# Patient Record
Sex: Male | Born: 1950 | Race: White | Hispanic: No | Marital: Married | Smoking: Former smoker
Health system: Southern US, Community
[De-identification: ages and names within clinical notes are randomized; demographics above are authoritative.]

## PROBLEM LIST (undated history)

## (undated) DIAGNOSIS — R011 Cardiac murmur, unspecified: Secondary | ICD-10-CM

## (undated) DIAGNOSIS — I10 Essential (primary) hypertension: Secondary | ICD-10-CM

## (undated) DIAGNOSIS — Z8679 Personal history of other diseases of the circulatory system: Secondary | ICD-10-CM

## (undated) DIAGNOSIS — L0390 Cellulitis, unspecified: Secondary | ICD-10-CM

## (undated) DIAGNOSIS — I219 Acute myocardial infarction, unspecified: Secondary | ICD-10-CM

## (undated) DIAGNOSIS — Z953 Presence of xenogenic heart valve: Secondary | ICD-10-CM

## (undated) HISTORY — PX: CARDIAC CATHETERIZATION: SHX172

## (undated) HISTORY — DX: Cardiac murmur, unspecified: R01.1

## (undated) HISTORY — PX: TONSILLECTOMY: SUR1361

## (undated) HISTORY — DX: Acute myocardial infarction, unspecified: I21.9

## (undated) HISTORY — DX: Essential (primary) hypertension: I10

## (undated) HISTORY — PX: VASECTOMY: SHX75

## (undated) HISTORY — PX: CARDIAC VALVE REPLACEMENT: SHX585

---
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
## 1997-09-06 ENCOUNTER — Ambulatory Visit (HOSPITAL_COMMUNITY): Admission: RE | Admit: 1997-09-06 | Discharge: 1997-09-06 | Payer: Self-pay | Admitting: Gastroenterology

## 1997-12-23 ENCOUNTER — Ambulatory Visit (HOSPITAL_BASED_OUTPATIENT_CLINIC_OR_DEPARTMENT_OTHER): Admission: RE | Admit: 1997-12-23 | Discharge: 1997-12-23 | Payer: Self-pay | Admitting: Orthopedic Surgery

## 2000-07-26 ENCOUNTER — Encounter: Admission: RE | Admit: 2000-07-26 | Discharge: 2000-10-24 | Payer: Self-pay | Admitting: Family Medicine

## 2001-06-08 ENCOUNTER — Encounter: Payer: Self-pay | Admitting: General Surgery

## 2001-06-13 ENCOUNTER — Ambulatory Visit (HOSPITAL_COMMUNITY): Admission: RE | Admit: 2001-06-13 | Discharge: 2001-06-14 | Payer: Self-pay | Admitting: General Surgery

## 2001-06-13 ENCOUNTER — Encounter (INDEPENDENT_AMBULATORY_CARE_PROVIDER_SITE_OTHER): Payer: Self-pay | Admitting: Specialist

## 2001-10-21 ENCOUNTER — Encounter: Payer: Self-pay | Admitting: Emergency Medicine

## 2001-10-22 ENCOUNTER — Observation Stay (HOSPITAL_COMMUNITY): Admission: EM | Admit: 2001-10-22 | Discharge: 2001-10-22 | Payer: Self-pay | Admitting: Emergency Medicine

## 2004-07-02 ENCOUNTER — Ambulatory Visit: Payer: Self-pay | Admitting: Internal Medicine

## 2004-08-03 ENCOUNTER — Ambulatory Visit: Payer: Self-pay | Admitting: Internal Medicine

## 2005-05-31 HISTORY — PX: CORONARY ARTERY BYPASS GRAFT: SHX141

## 2005-06-02 ENCOUNTER — Ambulatory Visit: Payer: Self-pay | Admitting: Internal Medicine

## 2005-06-16 ENCOUNTER — Ambulatory Visit (HOSPITAL_COMMUNITY): Admission: RE | Admit: 2005-06-16 | Discharge: 2005-06-16 | Payer: Self-pay | Admitting: Internal Medicine

## 2005-06-16 ENCOUNTER — Ambulatory Visit: Payer: Self-pay | Admitting: Internal Medicine

## 2005-06-30 ENCOUNTER — Ambulatory Visit: Payer: Self-pay | Admitting: Internal Medicine

## 2005-07-06 ENCOUNTER — Encounter: Payer: Self-pay | Admitting: Internal Medicine

## 2005-07-06 ENCOUNTER — Ambulatory Visit: Payer: Self-pay

## 2005-07-15 ENCOUNTER — Ambulatory Visit: Payer: Self-pay | Admitting: Internal Medicine

## 2005-07-15 ENCOUNTER — Ambulatory Visit (HOSPITAL_COMMUNITY): Admission: RE | Admit: 2005-07-15 | Discharge: 2005-07-15 | Payer: Self-pay | Admitting: Internal Medicine

## 2005-07-15 ENCOUNTER — Encounter: Payer: Self-pay | Admitting: Internal Medicine

## 2005-07-19 ENCOUNTER — Ambulatory Visit: Payer: Self-pay | Admitting: Professional

## 2005-08-05 ENCOUNTER — Ambulatory Visit: Payer: Self-pay | Admitting: Professional

## 2006-03-03 ENCOUNTER — Ambulatory Visit: Payer: Self-pay | Admitting: Professional

## 2006-03-28 ENCOUNTER — Ambulatory Visit: Payer: Self-pay | Admitting: Professional

## 2006-04-28 ENCOUNTER — Ambulatory Visit: Payer: Self-pay | Admitting: Internal Medicine

## 2006-12-27 ENCOUNTER — Ambulatory Visit: Payer: Self-pay | Admitting: Internal Medicine

## 2007-01-04 ENCOUNTER — Ambulatory Visit: Payer: Self-pay

## 2007-01-21 ENCOUNTER — Ambulatory Visit: Payer: Self-pay | Admitting: Cardiovascular Disease

## 2007-01-21 ENCOUNTER — Inpatient Hospital Stay (HOSPITAL_COMMUNITY): Admission: EM | Admit: 2007-01-21 | Discharge: 2007-01-25 | Payer: Self-pay | Admitting: Cardiovascular Disease

## 2007-01-23 ENCOUNTER — Encounter: Payer: Self-pay | Admitting: Cardiology

## 2007-01-26 ENCOUNTER — Ambulatory Visit: Payer: Self-pay | Admitting: Cardiology

## 2007-02-03 ENCOUNTER — Ambulatory Visit: Payer: Self-pay | Admitting: Internal Medicine

## 2007-02-06 ENCOUNTER — Ambulatory Visit: Payer: Self-pay | Admitting: Thoracic Surgery (Cardiothoracic Vascular Surgery)

## 2007-02-10 ENCOUNTER — Ambulatory Visit: Payer: Self-pay | Admitting: Cardiology

## 2007-02-17 ENCOUNTER — Ambulatory Visit: Payer: Self-pay | Admitting: Cardiology

## 2007-02-24 ENCOUNTER — Ambulatory Visit (HOSPITAL_COMMUNITY): Admission: RE | Admit: 2007-02-24 | Discharge: 2007-02-24 | Payer: Self-pay | Admitting: Surgery

## 2007-02-27 ENCOUNTER — Encounter
Admission: RE | Admit: 2007-02-27 | Discharge: 2007-02-27 | Payer: Self-pay | Admitting: Thoracic Surgery (Cardiothoracic Vascular Surgery)

## 2007-02-27 ENCOUNTER — Ambulatory Visit: Payer: Self-pay | Admitting: Thoracic Surgery (Cardiothoracic Vascular Surgery)

## 2007-03-03 ENCOUNTER — Ambulatory Visit: Payer: Self-pay | Admitting: Internal Medicine

## 2007-03-24 ENCOUNTER — Ambulatory Visit: Payer: Self-pay | Admitting: Cardiovascular Disease

## 2007-03-29 ENCOUNTER — Ambulatory Visit: Payer: Self-pay | Admitting: Internal Medicine

## 2007-04-03 ENCOUNTER — Ambulatory Visit: Payer: Self-pay | Admitting: Thoracic Surgery (Cardiothoracic Vascular Surgery)

## 2007-04-03 ENCOUNTER — Ambulatory Visit (HOSPITAL_COMMUNITY)
Admission: RE | Admit: 2007-04-03 | Discharge: 2007-04-03 | Payer: Self-pay | Admitting: Thoracic Surgery (Cardiothoracic Vascular Surgery)

## 2007-04-03 ENCOUNTER — Ambulatory Visit
Admission: RE | Admit: 2007-04-03 | Discharge: 2007-04-03 | Payer: Self-pay | Admitting: Thoracic Surgery (Cardiothoracic Vascular Surgery)

## 2007-04-03 ENCOUNTER — Encounter: Payer: Self-pay | Admitting: Thoracic Surgery (Cardiothoracic Vascular Surgery)

## 2007-04-06 ENCOUNTER — Encounter: Payer: Self-pay | Admitting: Internal Medicine

## 2007-04-06 DIAGNOSIS — T7840XA Allergy, unspecified, initial encounter: Secondary | ICD-10-CM | POA: Insufficient documentation

## 2007-04-06 DIAGNOSIS — E782 Mixed hyperlipidemia: Secondary | ICD-10-CM | POA: Insufficient documentation

## 2007-04-06 DIAGNOSIS — M5126 Other intervertebral disc displacement, lumbar region: Secondary | ICD-10-CM | POA: Insufficient documentation

## 2007-04-06 DIAGNOSIS — G4730 Sleep apnea, unspecified: Secondary | ICD-10-CM | POA: Insufficient documentation

## 2007-04-06 DIAGNOSIS — Z952 Presence of prosthetic heart valve: Secondary | ICD-10-CM | POA: Insufficient documentation

## 2007-04-06 DIAGNOSIS — I10 Essential (primary) hypertension: Secondary | ICD-10-CM | POA: Insufficient documentation

## 2007-04-10 ENCOUNTER — Ambulatory Visit: Payer: Self-pay | Admitting: Thoracic Surgery (Cardiothoracic Vascular Surgery)

## 2007-04-19 ENCOUNTER — Ambulatory Visit: Payer: Self-pay | Admitting: Thoracic Surgery (Cardiothoracic Vascular Surgery)

## 2007-04-19 ENCOUNTER — Inpatient Hospital Stay (HOSPITAL_COMMUNITY)
Admission: RE | Admit: 2007-04-19 | Discharge: 2007-04-24 | Payer: Self-pay | Admitting: Thoracic Surgery (Cardiothoracic Vascular Surgery)

## 2007-04-19 ENCOUNTER — Encounter: Payer: Self-pay | Admitting: Thoracic Surgery (Cardiothoracic Vascular Surgery)

## 2007-04-19 DIAGNOSIS — Z953 Presence of xenogenic heart valve: Secondary | ICD-10-CM

## 2007-04-19 DIAGNOSIS — Z8679 Personal history of other diseases of the circulatory system: Secondary | ICD-10-CM

## 2007-04-19 HISTORY — DX: Presence of xenogenic heart valve: Z95.3

## 2007-04-19 HISTORY — DX: Personal history of other diseases of the circulatory system: Z86.79

## 2007-04-26 ENCOUNTER — Ambulatory Visit: Payer: Self-pay | Admitting: Cardiology

## 2007-05-05 ENCOUNTER — Ambulatory Visit: Payer: Self-pay | Admitting: Cardiology

## 2007-05-05 LAB — CONVERTED CEMR LAB: Prothrombin Time: 28.9 s — ABNORMAL HIGH (ref 10.9–13.3)

## 2007-05-11 ENCOUNTER — Encounter (HOSPITAL_COMMUNITY): Admission: RE | Admit: 2007-05-11 | Discharge: 2007-05-31 | Payer: Self-pay | Admitting: Internal Medicine

## 2007-05-12 ENCOUNTER — Ambulatory Visit: Payer: Self-pay | Admitting: Cardiology

## 2007-05-12 ENCOUNTER — Ambulatory Visit: Payer: Self-pay | Admitting: Internal Medicine

## 2007-05-15 ENCOUNTER — Encounter
Admission: RE | Admit: 2007-05-15 | Discharge: 2007-05-15 | Payer: Self-pay | Admitting: Thoracic Surgery (Cardiothoracic Vascular Surgery)

## 2007-05-15 ENCOUNTER — Ambulatory Visit: Payer: Self-pay | Admitting: Thoracic Surgery (Cardiothoracic Vascular Surgery)

## 2007-05-19 ENCOUNTER — Ambulatory Visit: Payer: Self-pay | Admitting: Cardiology

## 2007-06-01 ENCOUNTER — Encounter (HOSPITAL_COMMUNITY): Admission: RE | Admit: 2007-06-01 | Discharge: 2007-08-30 | Payer: Self-pay | Admitting: Internal Medicine

## 2007-06-05 ENCOUNTER — Ambulatory Visit: Payer: Self-pay | Admitting: Internal Medicine

## 2007-07-12 ENCOUNTER — Ambulatory Visit: Payer: Self-pay | Admitting: Internal Medicine

## 2007-08-28 ENCOUNTER — Ambulatory Visit: Payer: Self-pay | Admitting: Thoracic Surgery (Cardiothoracic Vascular Surgery)

## 2007-11-27 ENCOUNTER — Ambulatory Visit: Payer: Self-pay | Admitting: Thoracic Surgery (Cardiothoracic Vascular Surgery)

## 2008-03-12 ENCOUNTER — Ambulatory Visit: Payer: Self-pay | Admitting: Internal Medicine

## 2008-06-03 ENCOUNTER — Ambulatory Visit: Payer: Self-pay | Admitting: Thoracic Surgery (Cardiothoracic Vascular Surgery)

## 2008-10-01 ENCOUNTER — Ambulatory Visit: Payer: Self-pay

## 2008-10-01 ENCOUNTER — Ambulatory Visit: Payer: Self-pay | Admitting: Internal Medicine

## 2008-10-01 ENCOUNTER — Encounter: Payer: Self-pay | Admitting: Internal Medicine

## 2009-06-02 ENCOUNTER — Encounter: Payer: Self-pay | Admitting: Internal Medicine

## 2009-06-02 ENCOUNTER — Ambulatory Visit: Payer: Self-pay | Admitting: Thoracic Surgery (Cardiothoracic Vascular Surgery)

## 2009-12-09 ENCOUNTER — Ambulatory Visit: Payer: Self-pay

## 2009-12-09 ENCOUNTER — Ambulatory Visit (HOSPITAL_COMMUNITY): Admission: RE | Admit: 2009-12-09 | Discharge: 2009-12-09 | Payer: Self-pay | Admitting: Internal Medicine

## 2009-12-09 ENCOUNTER — Ambulatory Visit: Payer: Self-pay | Admitting: Cardiology

## 2009-12-31 ENCOUNTER — Ambulatory Visit: Payer: Self-pay | Admitting: Internal Medicine

## 2010-06-18 ENCOUNTER — Other Ambulatory Visit: Payer: Self-pay | Admitting: Dermatology

## 2010-06-30 NOTE — Letter (Signed)
Summary: [facility]   [facility]   Imported By: Roderic Ovens 06/13/2009 14:04:30  _____________________________________________________________________  External Attachment:    Type:   Image     Comment:   External Document

## 2010-06-30 NOTE — Assessment & Plan Note (Signed)
Summary: [facility]      Allergies Added: NKDA  Visit Type:  Follow-up Primary Provider:  Ellamae Sia MD  CC:  no complaints.  History of Present Illness: [name] [name] is a delightful 60 year old male with a history of severe aortic stenosis, status post aortic valve replacement with bioprosthetic valve and aortic root replacement in 11/08.  He also has a history of atrial fibrillation, status post Cox-Maze procedure, obesity, hypertension, hyperlipidemia, and obstructive sleep apnea.   Here for routine f/u. Feels good. Walking dogs without CP, dyspnea. No palpitations, edema, orthopnea. Recent echo shows stable AVR with normal LV function.     Current Medications (verified): 1)  Toprol Xl 50 Mg  Tb24 (Metoprolol Succinate) .... Once Daily 2)  Caduet 5-20 Mg  Tabs (Amlodipine-Atorvastatin) .... Every Pm 3)  Fish Oil 1000 Mg  Caps (Omega-3 Fatty Acids) .... Take One Tablet By Mouth Once Daily. 4)  Prozac 20 Mg Caps (Fluoxetine Hcl) .... Take One Tablet By Mouth Once Daily.  Allergies (verified): No Known Drug Allergies  Past History:  Past Medical History: Last updated: 10/01/2008 1. Severe AS due to bicuspid AV      --s/p bioprosthetic AVR with root replacement and Cox-Maze, Nov 2008 2. Atrial fib s/p Cox-Maze 3. Obesity 4. HTN 5. Hyperlipidemia 6. Obstructive sleep apnea  Review of Systems       As per HPI and past medical history; otherwise all systems negative.   Vital Signs:  Patient profile:   60 year old male Height:      71 inches Weight:      298 pounds BMI:     41.71 Pulse rate:   58 / minute BP sitting:   146 / 70  (right arm) Cuff size:   regular  Vitals Entered By: Hardin Negus, RMA (December 31, 2009 12:06 PM)  Physical Exam  General:  Gen: well appearing. no resp difficulty HEENT: normal Neck: supple. no JVD. Carotids 2+ bilat; not bruits. No lymphadenopathy or thryomegaly appreciated. Cor: PMI nondisplaced. Regular rate & rhythm. No rubs, gallops.  soft systolic murmur RSB. S2 crisp. Lungs: clear Abdomen: soft, nontender, nondistended. No hepatosplenomegaly. No bruits or masses. Good bowel sounds. Extremities: no cyanosis, clubbing, rash, edema Neuro: alert & orientedx3, cranial nerves grossly intact. moves all 4 extremities w/o difficulty. affect pleasant    Impression & Recommendations:  Problem # 1:  AORTIC STENOSIS (ICD-424.1) Doing well s/p AVR. AVR stable by recent echo.   Problem # 2:  ATRIAL FIBRILLATION Quiescent s/p Maze. Off coumadin.   Other Orders: EKG w/ Interpretation (93000)  Patient Instructions: 1)  Your physician wants you to follow-up in: 1 year.  You will receive a reminder letter in the mail two months in advance. If you don't receive a letter, please call our office to schedule the follow-up appointment.

## 2010-10-13 NOTE — Assessment & Plan Note (Signed)
[facility]                            CARDIOLOGY OFFICE NOTE   DAJOHN, ELLENDER                        MRN:          [id number]  DATE:03/12/2008                            DOB:          June 23, 1950    PRIMARY CARE PHYSICIAN:  Harrel Lemon. Merla Riches, MD   HISTORY:  [name] [name] is a delightful 60 year old male with a history of severe  aortic stenosis, status post aortic valve replacement with bioprosthetic  valve and aortic root replacement.  He also has a history of atrial  fibrillation, status post Cox-Maze procedure, obesity, hypertension,  hyperlipidemia, and obstructive sleep apnea.   He returns today for routine followup.  He is doing quite well.  He  denies any chest pain or shortness of breath.  He has not had any  palpitations.  He is walking 30 minutes a day at lunch and 45 minutes at  night with his dogs.   CURRENT MEDICATIONS:  1. Toprol-XL 50 mg a day.  2. Caduet 5/20.  3. Fish oil.  4. Previously, he was on Cozaar, but this was stopped.   PHYSICAL EXAMINATION:  GENERAL:  He is well-appearing, in no acute  distress.  He walks around the clinic without any respiratory  difficulty.  VITAL SIGNS:  Blood pressure is initially 134/88, on manual recheck  155/90, heart rate 77, weight is 285.  HEENT:  Normal.  NECK:  Supple.  There is no JVD.  Carotids are 2+ bilaterally without  any bruits.  There is no lymphadenopathy or thyromegaly.  CARDIAC:  PMI is nonpalpable.  He has regular rate and rhythm with 2/6  systolic ejection murmur at the right sternal border.  There is no  diastolic component.  There is no rub or gallop.  LUNGS:  Clear.  ABDOMEN:  Obese, nontender, and nondistended.  No obvious  hepatosplenomegaly.  No bruits.  No masses.  Good bowel sounds.  EXTREMITIES:  Warm with no cyanosis, clubbing, or edema.  No rash.  NEUROLOGIC:  Alert and oriented x3.  Cranial nerves II through XII are  intact.  Moves all fours without difficulty.  PSYCH:  Affect is pleasant.   ASSESSMENT/PLAN:  1. Aortic stenosis, status post bioprosthetic valve replacement.  He      is doing well.  We will get a followup echocardiogram next year.  2. Atrial fibrillation.  This is resolved, status post Cox-Maze      procedure.  3. Hypertension.  Blood pressure is elevated.  He did have significant      hypotension after his surgery and we stopped his Cozaar.  We may      need to restart.  He will keep a blood pressure log and e-mail me      in 2 weeks.   DISPOSITION:  I will see him back at 6 months for routine followup.     Bevelyn Buckles. Bensimhon, MD  Electronically Signed    DRB/MedQ  DD: 03/12/2008  DT: 03/13/2008  Job #: [id number]   cc:   Harrel Lemon. Merla Riches, M.D.

## 2010-10-13 NOTE — H&P (Signed)
NAME:  [name] [name], [name] [name] NO.:  [id number]   MEDICAL RECORD NO.:  [id number]          PATIENT TYPE:  INP   LOCATION:  2040                         FACILITY:  [facility]   PHYSICIAN:  Noralyn Pick. Eden Emms, MD, FACCDATE OF BIRTH:  11-Mar-1951   DATE OF ADMISSION:  01/21/2007  DATE OF DISCHARGE:                              HISTORY & PHYSICAL   SUMMARY OF HISTORY:  Mr. [name] [name] is a 60 year old white male who was  transferred from Urgent Care secondary to atrial fibrillation.  The  patient states that he returned Tuesday evening from [place].  He  and his wife drove up to [place] and spent 4-5 days.  On Wednesday morning he  woke up around 10:00 a.m.  When he tried to stand, he became diaphoretic  and dizzy.  When he laid back down, his symptoms were relieved.  He  found that over the next several days, every time he stood up for  changed position, he would have similar symptoms.  He did have nausea on  one night, but he attributed this fragrances.  He denied any associated  chest discomfort, palpitations, shortness of breath or syncope.  He does  recall a prior occurrence similar to this 4-5 years ago and saw Dr.  Tresa Endo, however, specific diagnosis was not made.  He also had a similar  episode in a urology office at least 2 years ago.  Due to the  persistence he went to Urgent Care today and was transferred after EKG  showed atrial fibrillation with rapid ventricular rate and his blood  pressure in the 90s.  His wife also states that he had called the office  earlier this week and Heather asked him to increase his Toprol but he  did not do this.   ALLERGIES:  Allergy to BEE STINGS.   MEDICATIONS PRIOR TO ADMISSION:  1. Toprol XL 50 mg daily.  2. Caduet 5/20 daily.  3. Cozaar 100 daily.  4. Multivitamin which consists of multiple herb combinations.  5. Fish oil unknown dosage.  6. Occasional B12.  7. Prozac 10 mg daily was recently initiated.   PAST MEDICAL HISTORY:  1.  Hypertension with a history of renal Dopplers of unspecified date      as that the report is not specifically available.  According to old      office notes, he has a 30% right renal artery stenosis.  2. He has a history of hyperlipidemia last checked by Dr. Merla Riches      November 30, 2006, showed a total cholesterol 173, triglycerides 123,      HDL 43, LDL 105.  3. Obesity.  4. Obstructive sleep apnea with noncompliance with his CPAP.  5. Moderate aortic stenosis.  Last echocardiogram performed at Dr.      Prescott Gum office on January 04, 2007, showed EF of 75%, moderate      LVH.  There with mid cavity obstruction during Valsalva maneuver      with a peak flow of 5.5 meters per second and a peak gradient of      121 mmHg.  He has a known bicuspid valve by TEE in 2007, gradient      during the echocardiogram on January 04, 2007, showed 37 ABA by VTI      was 1.16.  His mean gradient increased from 24-37.  These results      have not been discussed with the patient nor his wife.  Last      Cardiolite available to [place] was possibly in 2006, and the Persantine      was negative.   PAST SURGICAL HISTORY:  Notable for  1. Hemorrhoids.  2. T&A in 1954.  3. Left knee surgery.  4. He denies specific thyroid disorder, diabetes, myocardial      infarction, CVA or bleeding dyscrasias.   SOCIAL HISTORY:  He resides in [place], [place], with his wife  of 36 years.  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  He has  two children.  No grandchildren.  He quit smoking in 2002, prior to that  he smoked approximately a pack per day for 20 years.  He may have a beer  or shot one time per month.  Denies any drugs.  He does not specifically  exercise.  He tries to maintain a 1300 calories per day diet.   FAMILY HISTORY:  Mother is 69 with thyroid problems.  She also has a  history of EtOH.  Father is 22, alive and well.  He has a brother who is  a cardiologist and a sister, both are alive  and well.   REVIEW OF SYSTEMS:  In addition to above, is notable for a 30-pound  weight loss in the last year, glasses, occasional headache.  Positive  snoring, occasional coughing with rare clear production, depression,  anxiety and mood swings with significant stress at work over the  preceding 9 months, arthralgias in his right knee, bright red blood per  rectum felt secondary to hemorrhoids, polyuria.   PHYSICAL EXAM:  GENERAL:  Well-nourished, well-developed, pleasant white  male in no apparent distress.  VITAL SIGNS:  Blood pressure is 94/62, pulse is 112 and irregular,  respirations are 16.  HEENT:  Unremarkable except for glasses.  NECK:  Supple without thyromegaly, adenopathy, JVD or carotid bruits.  CHEST:  Symmetrical excursion.  Clear to auscultation.  HEART:  Irregular irregular rapid rhythm with a mid peaking systolic  murmur best appreciated at the right sternal border.  Do  not appreciate  any rubs, clicks or gallops.  All pulses are symmetrical and intact  without any abdominal or femoral bruits.  SKIN:  Integument intact.  ABDOMEN:  Obese.  Bowel sounds present without organomegaly, masses or  tenderness.  EXTREMITIES:  Negative cyanosis, clubbing or edema.  MUSCULOSKELETAL:  Grossly unremarkable.  NEUROLOGIC:  Unremarkable.   An EKG from urgent care shows atrial fibrillation with a ventricular  rate of 112, normal axis, delayed R-wave.  Old EKGs are not available  for comparison.   IMPRESSION:  1. Atrial fibrillation with a rapid ventricular rate.  2. Near syncope associated with above.  3. Known moderate aortic stenosis with a bicuspid aortic valve which      has progressed by recent echocardiograms.  4. History as noted above.   DISPOSITION:  Dr. Eden Emms reviewed the patient's history, spoke with and  examined the patient.  He discussed findings of echocardiogram and  informed the patient and his wife that he may need aortic valve surgery  within the next  couple of years due to  the progression.  We will  increase his beta blocker to 50 mg b.i.d. and hydrate.  We will also  start Coumadin and heparin and encourage atrial fibrillation and  Coumadin education.  A TEE cardioversion  has potentially been set up for Monday at 9:00 a.m. with Dr. Jens Som  unless the patient converts to sinus rhythm on his own.  We will check  our usual labs including thyroid tests.  All these issues have been  discussed at length with the patient and his wife by Dr. Eden Emms.      Joellyn Rued, PA-C      Noralyn Pick. Eden Emms, MD, [facility]  Electronically Signed    EW/MEDQ  D:  01/21/2007  T:  01/22/2007  Job:  [id number]   cc:   Bevelyn Buckles. Bensimhon, MD  Harrel Lemon. Merla Riches, M.D.  Robert A. Thurston Hole, M.D.  Anselmo Rod, M.D.  Frederick A. Worthy Rancher, M.D.  Sigmund I. Patsi Sears, M.D.  Clinton D. Maple Hudson, MD, FCCP, FACP

## 2010-10-13 NOTE — Assessment & Plan Note (Signed)
[facility]                            CARDIOLOGY OFFICE NOTE   [name], [name]                        MRN:          [id number]  DATE:05/12/2007                            DOB:          23-Jan-1951    PRIMARY CARE PHYSICIAN:  Dr. Merla [name].   CARDIAC SURGEON:  [name] [name]. [name] [name], M.D.   INTERVAL HISTORY:  [name] [name] is a delightful 60 year old male with a history  of severe aortic stenosis, obesity, hypertension, hyperlipidemia, and  obstructive sleep apnea as well as paroxysmal atrial fibrillation who  returns today for routine followup.  He is status post aortic root  replacement with and a Cox-Maze procedure.  This was last month.  He is  doing quite well.  His energy level is picking up.  He denies any chest  pain or shortness of breath.  He has not had any lower extremity edema.  No palpitations.  He is very anxious to drive.  He did have some  problems with anxiety after the surgery but feels much better.   CURRENT MEDICATIONS:  1. Toprol 50 a day.  2. Caduet 5/20.  3. Cozaar 100 a day.  4. Fish oil.  5. Coumadin 5 a day.   PHYSICAL EXAMINATION:  GENERAL:  He is well-appearing in no acute  distress.  He ambulates around the clinic without any respiratory  difficulty.  VITAL SIGNS:  Blood pressure is 110/78, heart rate is 80, weight is 233.  HEENT:  Normal.  NECK:  Supple.  No  JVD.  Carotids are 2 plus bilaterally without any  bruits.  There is no lymphadenopathy or thyromegaly.  CARDIAC:  PMI is nondisplaced.  He has a regular rate and rhythm.  No  murmurs, rubs, or gallops.  Sternum appears stable.  The wound looks  good.  LUNGS:  Clear.  ABDOMEN:  Obese, nontender, nondistended.  No hepatosplenomegaly.  No  bruits.  No masses.  Good bowel sounds.  EXTREMITIES:  Warm with no cyanosis, clubbing, or edema.  No rash.  NEUROLOGIC:  He is alert and oriented x3.  Cranial nerves II-XII are  intact.  He moves all 4 extremities without  difficulty.   EKG shows a normal sinus rhythm at a rate of 80.   ASSESSMENT/PLAN:  1. Aortic stenosis, status post aortic valve repair.  He is doing well      on his current therapy.  2. Atrial fibrillation.  He is status post Cox-Maze procedure.  He      will see Dr. Cornelius [name] next week.  I suggested a 80-month period of      anticoagulation but we can reduce this if Dr. Cornelius [name] feels this is      appropriate.  3. Hypertension, well controlled.   DISPOSITION:  He is doing well.  I would let him go back to driving.  He  will start cardiac rehab on Monday.     Bevelyn Buckles. Bensimhon, MD  Electronically Signed    DRB/MedQ  DD: 05/12/2007  DT: 05/14/2007  Job #: [id number]   cc:   Dr. Filomena Jungling H.  [name] [name], M.D.

## 2010-10-13 NOTE — Assessment & Plan Note (Signed)
OFFICE VISIT   [name] [name], [name] [name]  DOB:  Jan 15, 1951                                        November 27, 2007  CHART #:  [id number]   HISTORY:  The patient returns in routine followup, status post aortic  valve replacement and left-sided Maze procedure done, April 19, 2007.  He was last seen in the office on August 28, 2007.  Currently, he is in [name]  normal sinus rhythm.  He reports no new symptoms.  He has returned to  work and is living an active lifestyle.   PHYSICAL EXAMINATION:  VITAL SIGNS:  Blood pressure 159/92, pulse 66,  respirations 18, and oxygen saturation is 96% on room air.  GENERAL:  He is [name] obese white male in no acute distress.  CARDIAC:  Regular rate and rhythm.  Soft systolic murmur.  No gallops or  rubs.  PULMONARY:  Clear lungs throughout.  EXTREMITIES:  No edema.   ASSESSMENT:  The patient continued to make excellent progress following  his surgery.  His rhythm is currently normal sinus.  He notes that he  will be seeing his cardiologist in the next 2 weeks.  From our  viewpoint, he is quite stable.  We will see him again in 6 months for  recheck of his rhythm.   Rowe Clack, P.[name].-C.   Sherryll Burger  D:  11/27/2007  T:  11/27/2007  Job:  [id number]   cc:   Bevelyn Buckles. Bensimhon, MD  Harrel Lemon. Merla Riches, M.D.

## 2010-10-13 NOTE — Assessment & Plan Note (Signed)
OFFICE VISIT   BRACKEN, MOFFA  DOB:  December 13, 1950                                        April 10, 2007  CHART #:  [id number]   HISTORY OF PRESENT ILLNESS:  The patient returns for a further follow-up  and preoperative planning for surgery.  We had originally planned to  proceed with surgery last week but when the patient presented to the  office on November 3, he was noted to have a rash as well as some  congestion suggestive of an upper respiratory tract infection.  When we  checked his routine blood work his white count was elevated.  We stopped  his amiodarone, presuming that the skin rash was related to the  amiodarone.  The patient was then treated with a brief course of  antibiotics (Z-Pak) and his symptoms have resolved.  He returns to the  office today for further follow-up.  He states that he feels better.  He  no longer has the congestion and sinus drainage and his cough has  resolved.  His rash has gone away.  He feels fine and has no other  complaints.  His physical exam is unchanged from previously.  White  blood count today is back to normal at 9700.   I spent an additional 20 minutes reviewing issues with the patient and  his wife.  All of their questions have been addressed.  We now  tentatively plan to proceed with surgery on Wednesday, November 19.   Salvatore Decent. Cornelius Moras, M.D.  Electronically Signed   CHO/MEDQ  D:  04/10/2007  T:  04/11/2007  Job:  [id number]   cc:   Bevelyn Buckles. Bensimhon, MD  Harrel Lemon. Merla Riches, M.D.

## 2010-10-13 NOTE — Consult Note (Signed)
NEW PATIENT CONSULTATION   DOYNE, MICKE A  DOB:  02/17/1951                                        February 06, 2007  CHART #:  [id number]   REASON FOR CONSULTATION:  Severe aortic stenosis.   HISTORY OF PRESENT ILLNESS:  [name] [name] is a 60 year old obese white  male with longstanding history of heart murmur and aortic stenosis who  has been followed for the last several years by Dr. Gala Romney at the  [facility] office.  [name] [name] states that he was first noted to  have a heart murmur during his childhood.  Ultimately echocardiograms  were performed demonstrating what appears to be bicuspid aortic valve,  and over the last several years he has been followed by Dr. Gala Romney  with gradually progressing severity of aortic stenosis.  Recently he had  an episode of severe dizzy spell without frank syncopal episode.  He  presented to Dr. Merla Riches, his primary care physician, and was found to  be in rapid atrial fibrillation.  He was transferred directly to [facility] for further management on August 23.  He underwent transesophageal  echocardiogram for further evaluation of his aortic valve and to rule  out left atrial thrombus for planned cardioversion.  He was cardioverted  back to sinus rhythm.  Transesophageal echocardiogram revealed severe  aortic stenosis with normal left ventricular systolic function and  moderate mitral regurgitation.  The was also a question raised regarding  the possibility of a small perimembranous ventricular septal defect.   On January 24, 2007, [name] [name] underwent left and right heart  catheterization.  This confirmed the presence of moderate to severe  aortic stenosis with no significant coronary artery disease.  Specifically, the peak and mean transvalvular gradients measured at  catheterization on pullback were 44 and 34 mmHg, respectively.  Calculated aortic valve area was 1.07 cm2.  At the time of  transesophageal  echocardiogram, valve area was measured at 0.9 cm2 using  planimetry.  PA pressures were measured 34/19 with a pulmonary capillary  wedge pressure of 19, and a small V wave was notably present.  There is  no record of whether or not a step-off in oxygen saturation was  appreciated during right heart catheterization.  [name] [name] was  anticoagulated with Coumadin and subsequently discharged home with plans  for elective cardiac surgical referral.   REVIEW OF SYSTEMS:  GENERAL:  The patient reports progressive exertional  fatigue.  He has cut back his activity level over the last 6 months  considerably because of this.  He has also lost approximately 30 pounds  in weight over the last 6 months on a very strict diet regimen.  The  patient reports that he is currently approximately 270 pounds, and he is  5 feet 11 inches tall.  He had weighed in excess of 300 pounds prior to  his diet.  CARDIAC:  The patient denies any symptoms of chest pain, chest  tightness, or chest pressure either with activity or at rest.  He does  admit to exertional shortness of breath which has progressed over the  last 6 months.  He had some dizzy spells associated with atrial  fibrillation, but he denies any tachy palpitations or syncopal episodes.  He has had some lower extremity edema off and on sporadically in the  past.  He denies any PND, orthopnea, or syncope.  RESPIRATORY:  Negative.  The patient denies productive cough,  hemoptysis, wheezing  GASTROINTESTINAL:  Negative.  The patient has no difficulty swallowing.  He has had some hemorrhoids and some lower GI bleeding in the past with  history of benign colonic polyps removed colonoscopically approximately  1 year a go.  He denies any bleeding problems recently, and his bowel  function is stable.  GENITOURINARY:  Negative.  PERIPHERAL VASCULAR:  Negative.  The patient denies symptoms suggestive  of claudication.  NEUROLOGIC:  Notable for fairly frequent  headaches that are nonfocal and  seem to be exacerbated by bright lights.  MUSCULOSKELETAL:  Notable for some mild chronic problems with his right  knee.  PSYCHIATRIC:  Negative.  HEENT:  Negative.   PAST MEDICAL HISTORY:  1. Aortic stenosis.  2. Hypertension.  3. Hyperlipidemia.  4. Obstructive sleep apnea.  5. Benign colonic polyps.  6. Hemorrhoids.  7. Obesity.   PAST SURGICAL HISTORY:  1. Tonsillectomy.  2. Hemorrhoidectomy.   FAMILY HISTORY:  Noncontributory.   SOCIAL HISTORY:  The patient is married and lives with his wife here in  [place].  He is a Education officer, environmental.  They have two grown children.  He  lives a very sedentary lifestyle.  He has a remote history of tobacco  abuse, smoking 2 packs of cigarettes per day for more than 20 years.  He  quit smoking 7 years ago.  He denies significant alcohol consumption.   CURRENT MEDICATIONS:  1. Cozaar 100 mg daily.  2. Caduet 5/20 one tablet daily.  3. Metoprolol 50 mg daily.  4. Fluoxetine 20 mg daily.  5. Coumadin 5 mg daily or as directed through the [facility].   DRUG ALLERGIES:  None known.   PHYSICAL EXAMINATION:  The patient is a well-appearing, obese male who  appears his stated age, in no acute distress.  Blood pressure 100/67,  pulse 59 and regular, oxygen saturation 94% on room air.  HEENT Exam is  unrevealing.  The patient has good dentition and reports that he sees  his dentist on a regular basis.  The neck is supple.  There is no  cervical nor supraclavicular lymphadenopathy.  There is no jugular  venous distention.  No carotid bruits are noted.  Auscultation of the  chest demonstrates clear breath sounds which are symmetrical  bilaterally.  No wheezes, rales, or rhonchi are noted.  Cardiovascular  exam demonstrates regular rate and rhythm.  There is a grade 3/6  systolic murmur heard along the sternal border with radiation towards  the neck.  No diastolic murmurs are noted.  The  abdomen is obese but  soft and nontender.  The liver edge cannot be palpated.  There are no  palpable masses.  Bowel sounds are present. The extremities are warm and  adequately perfused.  There is mild bilateral lower extremity edema.  Distal pulses are palpable in the dorsalis pedis position.  Rectal and  GU exams are both deferred.  The skin is clean, dry, and healthy  appearing throughout.   DIAGNOSTIC TESTS:  Transesophageal echocardiogram performed by Dr.  Gala Romney on January 23, 2007 at [facility] is  reviewed.  This demonstrates what appears to be a bicuspid aortic valve  with severe aortic stenosis.  The aortic valve area is estimated 0.9 cm2  by planimetry.  There is at least moderate (2+) mitral regurgitation.  This  consists of a central jet of regurgitation with associated  functional type 1 source and no sign of any mitral valve prolapse  whatsoever.  There may be some restriction (functional type 3B) of the  posterior leaflet.  This is a fairly broad central jet that fills most  of the left atrium.  I would suggest this is at least 2, perhaps 3+ in  severity.  Left ventricular systolic function appears normal.  There is  a small jet seen in the left ventricular outflow tract on color Doppler  of unclear etiology and significance.  I suspect this may be what was  interpreted as a possible small perimembranous ventricular septal  defect.  However, the color jet is going the wrong direction for a  ventricular septal defect, and I suspect this might be an unusually  eccentric small jet of aortic insufficiency that is reflecting off the  conal septum.  I do not see a jet directed into the right ventricle as  one would expect with a ventricular septal defect and left-to-right  shunting.   Cardiac catheterization performed August 26 is reviewed.  This confirms  the presence of normal coronary artery anatomy with no significant  coronary artery disease and  hemodynamic data as noted previously.   IMPRESSION:  Severe aortic stenosis with progressive symptoms of  exertional shortness of breath and fatigue.  [name] [name] also has at  least moderate mitral regurgitation despite normal left ventricular  systolic function, and he has now developed persistent atrial  fibrillation requiring direct current cardioversion this past month.  I  believe that he would best be treated with elective aortic valve  replacement and a concomitant maze procedure.  Concomitant mitral valve  annuloplasty might be indicated as well, and this can be reevaluated on  repeat transesophageal echocardiogram at the time of surgery now that  [name] [name] has been maintaining sinus rhythm for some period of time.   There is a question regarding the possibility of a small perimembranous  ventricular septal defect.  I am not convinced that this exists based  upon review of the recent transesophageal echocardiogram, but we will  review this further and look the tapes over with Dr. Gala Romney directly.  Certainly if a ventricular septal defect is appreciated, we would plan  on closing it at the time of surgery.   I have discussed at length the indications and potential benefits of  surgery with [name] [name] and his wife.  We have also discussed at length  a variety of surgical alternatives with particular reference to what  type of valve we would use to replace his aortic valve.  We have  discussed at length the risks and benefits of mechanical valves with  their need for long-term anticoagulation versus a variety of tissue  valve alternatives and all of their potential for late structural valve  deterioration and failure.  We have discussed the Ross procedure,  homograft replacement, and so forth.  All of their questions have been  addressed.  [name] [name] seems to feel that a bioprosthetic tissue valve  would probably be his choice, and I would support this decision  entirely.  He  does not wish to schedule surgery at this time.  I do not  feel that it is particularly dangerous to hold off, but on the other  hand, I have reminded him that he will continue to be at risk for  problems with recurrent atrial fibrillation as well as the potential for  worsening  symptoms of shortness of breath and heart failure.   We will plan to see him back in early November with hopes to proceed  with scheduling surgery at that time.  We will get a CT angiogram of his  thoracic aorta to evaluate the size of his ascending thoracic aorta  given his underlying bicuspid aortic valve disease and somewhat generous-  appearing aortic size seen at the time of catheterization. We will also  get some pulmonary function tests for baseline.  All of their questions  have been addressed.   Salvatore Decent. Cornelius Moras, M.D.  Electronically Signed   CHO/MEDQ  D:  02/06/2007  T:  02/06/2007  Job:  [id number]   cc:   Bevelyn Buckles. Bensimhon, MD  Harrel Lemon. Merla Riches, M.D.

## 2010-10-13 NOTE — Assessment & Plan Note (Signed)
OFFICE VISIT   MILLAN, LEGAN  DOB:  Sep 04, 1950                                        February 27, 2007  CHART #:  [id number]   HISTORY OF PRESENT ILLNESS:  Mr. Nutting returns for further followup  related to severe aortic stenosis with progressive symptoms of  exertional shortness of breath, as well as at least moderate mitral  regurgitation and persistent atrial fibrillation. He was originally seen  in consultation on February 06, 2007. Initially, he had planned to have  surgery towards the end of November, but he now returns to our office  requesting to push this up sooner. He underwent pulmonary function test  to further stratify his operative risk. These were performed April 26, 2007 and are notable for essentially normal spirometry with FEV1  measured 3.91 liters and a force vital capacity of 4.52 liters. His  diffusion capacity was slightly reduced at 79% predicted. Mr. Gehl  also underwent CT angiogram of the thoracic aorta to evaluate his  dilated ascending thoracic aorta. This reveals significant aneurysmal  dilatation of the aorta with maximum transverse diameter of 5.0 cm.   I have discussed the implications of these findings with Mr. Seeling and  his wife here in the office today. He will need resection and grafting  of the ascending thoracic aorta at the time of surgery. He remains firm  with his decision to proceed with aortic valve replacement using bio-  prosthetic tissue valve. Under the circumstances, he may well be a  suitable candidate for supra-coronary resection and grafting of the  ascending thoracic aorta, although the possibility of need for aortic  root replacement will be determined at the time of surgery. I suspect  that concomitant mitral valve repair will be necessary as well, and we  plan to proceed with a Maze procedure to treat his atrial fibrillation.  I spent in excess of 30 minutes, reviewing issues with Mr.  Mcphillips and  his wife. We will plan to start him on Amiodarone one week prior to  surgery. He has been instructed to stop taking Coumadin after the dose  on March 30, 2007, in preparation for surgery. I do not feel that a  bridge with Lovenox is necessary, given that he is maintaining sinus  rhythm. I will see  him back for further followup and preoperative  counseling on April 03, 2007 with plans to proceed with surgery on April 05, 2007.   Salvatore Decent. Cornelius Moras, M.D.  Electronically Signed   CHO/MEDQ  D:  02/27/2007  T:  02/27/2007  Job:  [id number]   cc:   Bevelyn Buckles. Bensimhon, MD  Harrel Lemon. Merla Riches, M.D.

## 2010-10-13 NOTE — Assessment & Plan Note (Signed)
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  He was last seen  here in the office on June 03, 2008.  Since then, he has continued to  do quite well.  He remains in normal sinus rhythm with no symptoms or  signs to suggest recurrence of atrial arrhythmias.  He reports normal  physical activity with no history of any symptoms of exertional chest  pain or shortness of breath.  Overall, he is getting along quite well.  He has no problems or complaints.  The remainder of his review of  systems is unchanged.  The remainder of his past medical history is  unchanged.   CURRENT MEDICATIONS:  1. Caduet 5/20 one tablet daily.  2. Toprol-XL 50 mg daily.  3. Aspirin 81 mg daily.  4. Vitamin D supplement.  5. Omega-3 fatty acids supplement.   PHYSICAL EXAMINATION:  Notable for well-appearing male with blood  pressure 140/80, pulse 61, oxygen saturation 97%.  Examination of the  chest reveals clear breath sounds which are symmetrical bilaterally.  Cardiovascular exam is notable for regular rate and rhythm.  No murmurs,  rubs, or gallops are noted.  The abdomen is soft, nontender.  The  extremities are warm and well perfused.  There is no lower extremity  edema.   IMPRESSION:  The patient is doing well now more than 2 years status post  aortic valve replacement, resection and grafting of ascending thoracic  aortic aneurysm, and a maze procedure.  He is maintaining sinus rhythm  and doing quite well.   PLAN:  In the future, the patient will call and return to see [place] as  needed.   Salvatore Decent. Cornelius Moras, M.D.  Electronically Signed   CHO/MEDQ  D:  06/02/2009  T:  06/03/2009  Job:  [id number]   cc:   Bevelyn Buckles. Bensimhon, MD  Harrel Lemon. Merla Riches, M.D.

## 2010-10-13 NOTE — Assessment & Plan Note (Signed)
OFFICE VISIT   [name] [name], [name] [name] A  DOB:  [birth year]                                        June 03, 2008  CHART #:  [id number]   HISTORY OF PRESENT ILLNESS:  The patient returns for followup now more  than 1 year following aortic valve replacement, resection or grafting of  ascending thoracic aortic aneurysm, and a maze procedure on April 19, 2007.  He was last seen here in the office on November 27, 2007.  Since  then, he has done quite well.  He reports his exercise tolerance  continues to improve.  He has not had any tachypalpitations or dizzy  spells.  He has not had any shortness of breath.  He has not had any  chest pain.  The remainder of his past medical history is unchanged and  he is feeling well.  He has continued to abstain from any tobacco use.   CURRENT MEDICATIONS:  Metoprolol, aspirin, and Caduet.   PHYSICAL EXAMINATION:  GENERAL:  Notable for well-appearing male.  VITAL SIGNS:  Blood pressure 133/85, pulse 64, and oxygen saturation 96%  on room air.  CHEST:  Well-healed median sternotomy scar.  Breath sounds are clear to  auscultation and symmetrical bilaterally.  CARDIOVASCULAR:  Demonstrates regular rate and rhythm.  No murmurs,  rubs, or gallops are noted.  ABDOMEN:  Soft, nontender.  EXTREMITIES:  Warm and well perfused.   IMPRESSION:  The patient continues to do quite well.  He is maintaining  sinus rhythm.   PLAN:  We will ask the patient to return for followup in 1 year's time  to check his rhythm.   Salvatore Decent. Cornelius Moras, M.D.  Electronically Signed   CHO/MEDQ  D:  06/03/2008  T:  06/03/2008  Job:  [id number]   cc:   Bevelyn Buckles. Bensimhon, MD  Harrel Lemon. Merla Riches, M.D.

## 2010-10-13 NOTE — Discharge Summary (Signed)
NAMETAUNO, FALOTICO NO.:  [id number]   MEDICAL RECORD NO.:  [id number]          PATIENT TYPE:  INP   LOCATION:  2040                         FACILITY:  [facility]   PHYSICIAN:  Bevelyn Buckles. Bensimhon, MDDATE OF BIRTH:  [birth year]   DATE OF ADMISSION:  01/21/2007  DATE OF DISCHARGE:  01/25/2007                               DISCHARGE SUMMARY   PROCEDURES:  1. Cardiac catheterization.  2. Coronary arteriogram.  3. Left ventriculogram.  4. Right heart catheterization.  5. Transesophageal echocardiogram.  6. Direct current cardioversion.   Time for discharge:  48 minutes.   FINAL DISCHARGE DIAGNOSES:  1. Near syncope.  2. Bicuspid moderately thickened aortic valve with decreased      excursion.  3. Hypertension.  4. Remote history of tobacco use.  5. Status post tonsillectomy and left knee surgery.  6. Obesity.  7. Obstructive sleep apnea (noncompliance with CPAP).  8. Hyperlipidemia.  9. Allergy or intolerance to bee stings.  10.Anticoagulation with Coumadin (Lovenox bridge) started this      admission.  11.Non-obstructive peripheral vascular disease with bilateral renal      artery stenosis, 30% by her renal Dopplers.   HOSPITAL COURSE:  Mr. Delene Ruffini is a 60 year old male with no previous  history of coronary artery disease.  For several days prior to  admission, he had orthostatic dizziness.  He went to his primary care  physician and was shown to be in atrial fib with a rapid ventricular  response.  He was hypotensive with this with a systolic blood pressure  in the 90s.  He was transferred to [facility] and admitted for  further evaluation.   He was started on heparin as well as Coumadin.  His cardiac enzymes were  cycled and were negative for MI.  A hemoglobin A1C was  mildly elevated  at 6.4.  One fasting blood sugar was 92 and another one was 105.  This  needs to be followed closely.  A lipid profile was performed which  showed a total  cholesterol 155, triglycerides 121, HDL 35, LDL 96.  TSH  was within normal limits at 1.149.  Free T4 was also within normal  limits at 1.25 and a T3 uptake was mildly elevated at 48.6 with the  upper limit of normal being 37.  No further evaluation is indicated at  this time.  Because of the hypotension associated with the atrial  fibrillation, as well as the need for anticoagulation, he was scheduled  for a TEE cardioversion which was performed on January 23, 2007.  The TEE  showed an EF of 70% with mild to moderate MR but no clot.  He was  cardioverted with 200 joules biphasically x1.  He converted to sinus  rhythm.  He was continued on the beta blocker.  Because of the need for  surgical repair of his aortic valve, he was taken to the cath lab for a  right and left heart catheterization.   His aortic valve was calcified and on left ventriculogram, his EF was  70%.  He had no coronary artery  disease with moderate to severe AS and  it was felt that he needed CVTS evaluation for an aortic valve  replacement and possible Maze procedure.  His mean gradient across the  aortic valve was 34 mmHg.  The calculated area was 1.07 cm2.   On January 25, 2007 his INR was 1.9.  His post cath labs otherwise were  within normal limits.  Mr. Ellsworth was giving himself Lovenox shots and  it was felt that this could be continued as an outpatient.  It was also  felt that he was stable for outpatient referral to Dr.  Cornelius Moras as long as  he maintained sinus rhythm.  He was evaluated by Dr. Gala Romney and  considered stable for discharge with outpatient follow-up arranged.  Of  note, he had a panic attack in the hospital and was given a prescription  for short term p.r.n. Xanax as well as increasing his Prozac from 10 to  20 mg a day.   DISCHARGE INSTRUCTIONS:  His activity level is to be increased gradually  with no driving for 24 hours and no lifting for week.  He is to call our  office for any problems with  the cath site.  He is to follow up Dr.  Gala Romney October 29 at 1:45 p.m.  He is to follow up with Dr. Merla Riches  as needed.  He is to follow up with Dr. Cornelius Moras and information will be  faxed to the office so they can contact him for an appointment.  He also  they can contact him for appointment.  He is to follow up with the  Coumadin clinic on August 28 to 02/15.  He is to stick to a low sodium  heart healthy diet.   DISCHARGE MEDICATIONS:  1. Coumadin 5 mg q. day or as directed.  2. Lovenox 120 mg q.12h.  3. Prozac 20 mg a day.  4. Xanax 0.25 mg t.i.d. p.r.n.  5. Caduet 5/20 daily  6. Cozaar 100 mg a day.  7. Toprol XL 50 mg a day.  8. Vitamins are currently on hold.      Theodore Demark, PA-C      Bevelyn Buckles. Bensimhon, MD  Electronically Signed    RB/MEDQ  D:  01/25/2007  T:  01/26/2007  Job:  [id number]   cc:   Salvatore Decent. Cornelius Moras, M.D.  Robert P. Merla Riches, M.D.

## 2010-10-13 NOTE — Assessment & Plan Note (Signed)
OFFICE VISIT   [name] [name], [name] [name]  DOB:  Jun 21, 1950                                        May 15, 2007  CHART #:  [id number]   HISTORY OF PRESENT ILLNESS:  The patient returns for a routine followup  status post aortic valve replacement resection and grafting of the  ascending thoracic aorta and left side Cox-MAZE procedure on April 19, 2007. The patient's post operative recovery has been uneventful.  Following hospital discharge, he has continued to do fairly well and he  returns to the office for routine followup today. He continues to have  his Coumadin dose adjusted and monitored through the [facility]. He reports mild residual soreness in his chest that is slowly  improving. He still has trouble sleeping at night, primarily because he  just cannot sleep lying on his back. Unfortunately, the soreness in his  chest precludes him from getting comfortable lying on his side, which he  is more accustomed to sleeping as. He has not had significant shortness  of breath, although he still gets tired with activity. He at times gets  anxious and frustrated, and both he and his wife acknowledge this may be  more related to his underlying personality than it is related to  subjective physical complaints at this point in time. His appetite is  good. He started cardiac rehab today. Overall he appears to be getting  along fairly well.   PHYSICAL EXAMINATION:  Is notable for a well-appearing obese male with  blood pressure of 111/69, pulse 71, two channel telemetry rhythm strip  demonstrates what appears to be normal sinus rhythm. Oxygen saturation  is 95% on room air. Examination of the chest reveals a median sternotomy  incision that is healing nicely. The sternum is stable on palpation.  Breath sounds are clear to auscultation and symmetrical bilaterally. No  wheezes or rhonchi are noted. CARDIOVASCULAR: Includes regular rate and  rhythm. No  murmurs, rubs or gallops are appreciated. ABDOMEN: Soft and  nontender. EXTREMITIES: Are warm and well perfused. There is trace  bilateral lower extremity edema. The remainder of his physical  examination is unrevealing.   DIAGNOSTIC TESTS:  Chest x-ray obtained today at the [facility] is reviewed. This demonstrates clear lung fields bilaterally. All  the sternal wires appear intact. There are no significant pleural  effusions. No other abnormalities are noted.   IMPRESSION:  Satisfactory progress following recent aortic valve  replacement, resection and grafting of ascending thoracic aorta, and  left side MAZE procedure. The patient appears to be doing well and he is  maintaining normal sinus rhythm.   PLAN:  I have encouraged the patient to continue to gradually increase  his physical activity. His only limitations at this point remaining that  he refrain from heavy lifting or strenuous use of his arms or shoulders  for at least another two months. I have encouraged him to continue with  the cardiac rehab program. I have suggested that he might do better  trying to sleep at night if he tried sleeping in a recliner or something  that would prop his head up. It may be that some of his complaints stem  from his underlying obstructive sleep apnea as well. Overall, he seems  to be getting along quite well. I have given  him a prescription for  lorazepam 0.5 mg to use as needed for anxiety in the short term. I do  not feel that this is something he should likely require on a longterm  basis, but he seems to still have some issues with respect to work and  stress related to that. He may benefit from holding off for a full three  months following this surgery before he tries to go back to work and  return to that type of stressful situation. All of his questions have  been addressed. We have otherwise not made any changes in his current  medications. The patient will return  for further followup and rhythm  check in two months.   Salvatore Decent. Cornelius Moras, M.D.  Electronically Signed   CHO/MEDQ  D:  05/15/2007  T:  05/15/2007  Job:  [id number]   cc:   Bevelyn Buckles. Bensimhon, MD  Harrel Lemon. Merla Riches, M.D.

## 2010-10-13 NOTE — Cardiovascular Report (Signed)
NAMEDELMA, VILLALVA NO.:  [id number]   MEDICAL RECORD NO.:  [id number]          PATIENT TYPE:  INP   LOCATION:  2040                         FACILITY:  [facility]   PHYSICIAN:  Bevelyn Buckles. Bensimhon, MDDATE OF BIRTH:  1950-10-23   DATE OF PROCEDURE:  01/24/2007  DATE OF DISCHARGE:                            CARDIAC CATHETERIZATION   PATIENT IDENTIFICATION:  [name] [name] is a very pleasant 60 year old male  with a history of aortic stenosis secondary to a bicuspid aortic valve.  We have been watching this.  He got admitted to the hospital, over the  weekend, with symptomatic atrial fibrillation.  Yesterday he underwent a  TEE guided cardioversion that showed a normal ejection fraction with  worsening of his aortic stenosis.  By TEE there was severe aortic  stenosis with calculated valve area of 0.9 cm2.  There was also mild-to-  moderate mitral regurgitation.  He is undergoing catheterization as part  of his preoperative workup.   PROCEDURES PERFORMED:  1. Right heart cath.  2. Left heart cath.  3. Left ventriculogram.  4. Selective coronary angiography.   DESCRIPTION OF PROCEDURE:  A 6-French arterial sheath was placed in the  right femoral artery using a modified Seldinger technique.  A JL-5 was  used to image the left coronary system.  The right coronary artery came  off anteriorly.  We tried multiple catheters and finally were able to  engage it directly with an AL-1 catheter.  We were able to cross the  aortic valve using a right coronary catheter and a straight wire;  however, we were unable to keep the right coronary catheter in the left  ventricle due to significant ectopy.  We then changed out and crossed  the aortic valve with a straight wire and a bent pigtail after multiple  attempts.  There are no apparent complications.   A 7-French venous sheath was placed in the right femoral vein; and a  standard right heart catheterization was performed using a  Swan-Ganz  catheter.  The patient was transferred to the holding area after the  procedure in stable condition.   HEMODYNAMIC RESULTS:  Right atrial pressure mean of 13, RV 33/10.  PA  34/19 with a mean of 25.  Pulmonary capillary wedge pressure is a mean  of 19.  There were small V-waves.  The transpulmonary gradient was 6  with a pulmonary vascular resistance of 1.1.  Left ventricle 155/10 with  an EDP of 24.  Central aortic pressure is 103/67 with a mean of 82.  Fick cardiac output was 5.5 liters per minute; and cardiac index was 2.3  liters per minute per meter squared.  On pullback the peak gradient  across the aortic valve was 44 mmHg, with a mean gradient of 34 mmHg.  The aortic valve area calculated out to be 1.07 cm2.   CORONARY ANATOMY:  1. Left main was short, angiographically normal.  2. LAD was a long vessel coursing to the apex.  It gave off two      diagonals.  Angiographically normal.  3. Left circumflex was a  very large system.  It gave off a large ramus      branch, a large OM-1, two small posterolaterals, and a small PDA.      Angiographically normal.  4. Right coronary artery was a small codominant vessel.  It gave off      small acute marginal and a small PDA.  Angiographically normal.  5. Left ventriculogram done in the RAO position showed a heavily      calcified aortic valve but with mild valve opening.  Ejection      fraction was 70%.  There were no regional wall motion      abnormalities.  Unable to assess for mitral regurgitation due to      initial ectopy.   ASSESSMENT:  1. Normal coronary arteries.  2. Moderate-to-severe aortic stenosis.  3. Mild-to-moderate mitral regurgitation by echocardiogram.  4. Paroxysmal atrial fibrillation now in sinus rhythm after      cardioversion.   PLAN:  Will be to refer him to Dr. Tressie Stalker in cardiothoracic  surgery for evaluation of an elective aortic valve replacement and Maze  procedure +/- mitral valve  repair.      Bevelyn Buckles. Bensimhon, MD  Electronically Signed     DRB/MEDQ  D:  01/24/2007  T:  01/24/2007  Job:  [id number]

## 2010-10-13 NOTE — Op Note (Signed)
[name] [name], [name] [name] NO.:  [id number]   MEDICAL RECORD NO.:  [id number]          PATIENT TYPE:  INP   LOCATION:  2040                         FACILITY:  [facility]   PHYSICIAN:  Bevelyn Buckles. Bensimhon, MDDATE OF BIRTH:  [birth year]   DATE OF PROCEDURE:  01/23/2007  DATE OF DISCHARGE:                               OPERATIVE REPORT   PROCEDURE:  Direct current cardioversion report.   REASON FOR PROCEDURE:  Symptomatic atrial fibrillation.   DESCRIPTION OF PROCEDURE:  Prior to cardioversion the patient underwent  TEE.  This showed an ejection fraction of 70% with moderate to severe  aortic stenosis in the setting of a bicuspid aortic vavle.  There is no  left atrial appendage thrombus.  He was further sedated by anesthesia  with 175 mg of sodium Pentothal.  Once appropriate sedation was achieved  he received a single synchronized 200 joules biphasic shock with  conversion to sinus rhythm.  There were no apparent complications.      Bevelyn Buckles. Bensimhon, MD  Electronically Signed     DRB/MEDQ  D:  01/23/2007  T:  01/23/2007  Job:  [id number]

## 2010-10-13 NOTE — Assessment & Plan Note (Signed)
OFFICE VISIT   JILL, RUPPE  DOB:  July 24, 1950                                        August 28, 2007  CHART #:  [id number]   HISTORY OF PRESENT ILLNESS:  Mr. Yo returns for a routine followup  status post aortic valve replacement and left side  Maze procedure on  April 19, 2007. He was last seen here in the office May 15, 2007. Since then, he has continued to do quite well. He has been  followed carefully by Dr. Gala Romney, and he has recently been taken off  of Coumadin. Mr. Cowher reports no problems or complaints. He has  completed the cardiac rehab program and reports that he now feels  considerably better than he did prior to his surgery. His exercise  tolerance is considerably improved. He has no significant residual  soreness, although he does complain that his surgical scar remains  hypersensitive to the touch. He otherwise feels well. He is actively  working on an exercise program and hopes to continue to lose some  weight. The remainder of his review of systems is unremarkable. He  particularly notes that he has not had any symptoms of tachy,  palpitations or dizzy spells.   CURRENT MEDICATIONS:  1. Toprol XL.  2. Caduet. .  3. Aspirin.   PHYSICAL EXAMINATION:  Well-appearing male with blood pressure of  138/89, pulse 73. Two-channel telemetry rhythm strip demonstrates normal  sinus rhythm.  HEENT:  Unrevealing.  NECK:  Supple. There is no lymphadenopathy. There is no jugular venous  distention.  STERNUM: The patient's median sternotomy scar has healed nicely. The  sternum is stable on palpation.  CHEST:  Auscultation of the chest reveals clear breath sounds which are  symmetrical bilaterally. No wheezes or rhonchi are noted.  CARDIOVASCULAR:  Regular rate and rhythm. There is a grade 2/6 systolic  murmur heard at the right upper sternal border. No diastolic murmurs are  noted.  ABDOMEN:  Soft, nontender.  EXTREMITIES:  Warm  and well perfused.  The remainder of his physical exam is unrevealing.   IMPRESSION:  Satisfactory progression now more than 3 months following  aortic valve replacement and a left side Maze  procedure. Mr. Kuehl  appears to be doing quite well, and he is maintaining normal sinus  rhythm off Coumadin.   PLAN:  I have released Mr. Baranowski to unrestricted physical activity. He  will continue his current medications as outlined previously. We will  plan to see him back in 3 months for routine followup with rhythm check.   Salvatore Decent. Cornelius Moras, M.D.  Electronically Signed   CHO/MEDQ  D:  08/28/2007  T:  08/28/2007  Job:  [id number]   cc:   Bevelyn Buckles. Bensimhon, MD  Harrel Lemon. Merla Riches, M.D.

## 2010-10-13 NOTE — Assessment & Plan Note (Signed)
[facility]                            CARDIOLOGY OFFICE NOTE   [name] [name], [name] [name]                        MRN:          [id number]  DATE:07/12/2007                            DOB:          [birth year]    PRIMARY CARE PHYSICIAN:  Harrel Lemon. Merla Riches, M.D.   INTERVAL HISTORY:  [name] [name] is a delightful 60 year old male with a history  of severe aortic stenosis, obesity, hypertension, hyperlipidemia, atrial  fibrillation, and obstructive sleep apnea who returns today for routine  follow up.  He is status post aortic root replacement with aortic valve  replacement Cox maze procedure.  He is now about the 3 months' out.  He  is doing quite well.  He is going to cardiac rehab.  He is having  problems with low blood pressures and some orthostasis.  He denies any  chest pain or shortness of breath.   CURRENT MEDICATIONS:  1. Toprol XL 50 a day.  2. Caduet 5/20.  3. Cozaar 100 a day.  4. Fish oil.   PHYSICAL EXAM:  He is well-appearing no acute distress.  The image on  the clinic without respiratory difficulty.  Blood pressure is 104/80; heart rate is 80; weight is 286.  HEENT is normal.  NECK:  Supple.  No JVD.  Carotids are 2+ bilaterally without any bruits.  There is no lymphadenopathy or thyromegaly.  CARDIAC:  PMI is nondisplaced.  Regular rate and rhythm.  No murmurs,  rubs or gallops.  Sternum is stable.  LUNGS:  Clear.  ABDOMEN:  Obese, nontender, nondistended.  No hepatosplenomegaly, no  bruits, no masses.  Good bowel sounds.  EXTREMITIES:  Warm with no cyanosis, clubbing or edema.  No rash.  NEURO:  Alert and x3.  Cranial nerves II-XII are intact.  Moves all 4  extremities without difficulty.  Affect is pleasant.   ASSESSMENT/PLAN:  1. Aortic stenosis, status post aortic valve replacement, and aortic      root replacement.  He is doing well.  2. Chronic atrial fibrillation.  This is resolved after his Cox maze      procedure.  There has been no  evidence of recurrence.  3. Hypertension.  Blood pressure is actually quite low.  We will      decrease his Cozaar to 50 mg a day.   DISPOSITION:  Return to clinic in 4-6 months for routine followup.     Bevelyn Buckles. Bensimhon, MD  Electronically Signed    DRB/MedQ  DD: 07/12/2007  DT: 07/14/2007  Job #: [id number]   cc:   Harrel Lemon. Merla Riches, M.D.

## 2010-10-13 NOTE — Discharge Summary (Signed)
NAME:  [name] [name], [name] [name] NO.:  [id number]   MEDICAL RECORD NO.:  [id number]          PATIENT TYPE:  INP   LOCATION:  2006                         FACILITY:  [facility]   PHYSICIAN:  Salvatore Decent. Cornelius Moras, M.D. DATE OF BIRTH:  1951/01/19   DATE OF ADMISSION:  04/19/2007  DATE OF DISCHARGE:  04/24/2007                               DISCHARGE SUMMARY   ADMITTING DIAGNOSIS:  Severe aortic stenosis.   DISCHARGE DIAGNOSES:  1. Severe aortic stenosis.  2. Ascending aortic aneurysm.  3. Persistent atrial fibrillation.  4. Postoperative acute blood loss anemia.  5. Hypertension.  6. Hyperlipidemia.  7. Obstructive sleep apnea.  8. Benign colon polyps.  9. Hemorrhoids.  10.Obesity.  11.History of tobacco abuse.   PROCEDURES PERFORMED:  1. Aortic valve replacement with 25-mm, Edwards Magnum pericardial      tissue valve.  2. Resection and grafting of ascending thoracic aortic aneurysm.  3. Left-sided Cox-Maze procedure.   HISTORY OF PRESENT ILLNESS:  The patient is a 60 year old male with a  longstanding history of a heart murmur who has been followed by [facility] for several years with known aortic stenosis.  His  echocardiogram has demonstrated a bicuspid native aortic valve and he  has had worsening severity of his stenosis by echocardiogram.  In August  2008, he developed new-onset atrial fibrillation.  At that time, he  underwent TEE and DC cardioversion back to normal sinus rhythm.  At that  time, the echocardiogram confirmed the bicuspid aortic valve with severe  aortic stenosis, normal left ventricular systolic function and moderate  MR.  There was also a question of small, perimembranous ventricular  septal defect.  He subsequently underwent a left and right heart  catheterization and this showed no significant coronary artery disease,  but confirmed moderate to severe AS.  He was referred to Dr. Tressie Stalker for surgical consideration and underwent CT  angiography which  demonstrated aneurysmal enlargement of the ascending thoracic aorta with  a maximum transverse diameter of 5-cm.  It was Dr. Orvan July opinion that  he should undergo AVR replacement of the ascending aorta and maze  procedure at this time.  He explained the risks, benefits and  alternatives of the procedure to the patient and he agreed to proceed  with surgery.   HOSPITAL COURSE:  [name] was admitted on April 19, 2007, and was  taken to the operating room where he underwent the above-described  procedures performed by Dr. Cornelius Moras.  He tolerated the procedure well and  was transferred to the SICU in stable condition.  He was able to be  extubated shortly after surgery.  He was hemodynamically stable and  doing well on postop day #1.  His chest tubes and hemodynamic monitoring  devices were removed and he was weaned from all pressor agents over the  first 24 hours postop.  By the postop day #2, he was able to be  transferred to the step-down unit.  Overall, his postoperative course  has been uneventful.  He is maintaining normal sinus rhythm.  He has  been started  on Coumadin and his anticoagulation is ongoing.  He has  been started on Lasix and is diuresing very well for volume overload.  He has been afebrile and all vital signs have been stable.  His blood  pressures have started trending back up and he has been restarted on his  home doses of Lopressor and Cozaar.  He is ambulating with cardiac rehab  phase I and is making good progress.  He is tolerating a regular diet  and is having normal bowel and bladder function.  His most recent labs  showed a hemoglobin of 10.6, hematocrit 31.5, platelets 174, white count  13.8.  INR was 1.2.  Sodium was 138, potassium 3.6, which has been  replaced, BUN 22, creatinine 1.0.  It is felt that if he continues to  remain stable and his INR trends upward over the next 24 hours, he will  hopefully be ready for discharge home on  April 24, 2007, pending  morning round evaluation.   DISCHARGE MEDICATIONS:  1. Coumadin home dose will be determined by PT/INR drawn on the date      of discharge.  2. Aspirin 81 mg daily.  3. Lopressor 25 mg b.i.d.  4. Lipitor 20 mg daily.  5. Tylox one to two q.4 h. p.r.n. for pain.   ACTIVITY:  He is asked to refrain from driving, heavy lifting or  strenuous activity.  He may continue ambulating daily and using his  incentive spirometer.   WOUND CARE:  He may shower daily and clean his incisions with soap and  water.  He will continue a low-fat, low-sodium diet.   FOLLOW UP:  He will need to follow up with [facility]  within 48 hours of discharge for measurement of his INR.  He will also  need to schedule an appointment to see Dr. Gala Romney in 2 weeks.  He  will see Dr. Cornelius Moras back in 3 weeks with a chest x-ray from [facility] and our office will contact him with this appointment.  In the  interim, he is asked to contact us if he experiences problems or has  questions.      Coral Ceo, P.A.      Salvatore Decent. Cornelius Moras, M.D.  Electronically Signed    GC/MEDQ  D:  04/23/2007  T:  04/24/2007  Job:  [id number]   cc:   Bevelyn Buckles. Bensimhon, MD  Harrel Lemon. Merla Riches, M.D.

## 2010-10-13 NOTE — Assessment & Plan Note (Signed)
[facility]                            CARDIOLOGY OFFICE NOTE   [name], [name]                        MRN:          [id number]  DATE:12/27/2006                            DOB:          04-Dec-1950    PRIMARY CARE PHYSICIAN:  Dr. Merla [name].   INTERVAL HISTORY:  [name] [name] is a delightful 60 year old male with a history  of moderate aortic stenosis, mean gradient of 24 mmHg, hypertension and  hyperlipidemia, obstructive sleep apnea and obesity.  He returns today  for routine followup.   He is doing great.  He has been watching his diet very closely and is  limiting himself to 1300 calories.  Subsequently he lost 20 pounds.  Unfortunately, he has not been exercising regularly.  He has not had any  chest pain or shortness of breath.   CURRENT MEDICATIONS:  1. Toprol XL 50 mg daily.  2. Caduet 5/20.  3. Cozaar 100 mg daily.  4. Multivitamin.  5. Fish oil.   PHYSICAL EXAMINATION:  He is well-appearing, no acute distress.  Ambulates around the clinic without any respiratory difficulty.  Blood pressure is 110/70, heart rate is 54.  Weight is 280 pounds.  HEENT:  Normal.  NECK:  Supple.  No JVD. Carotids are 2+ bilaterally without any bruits.  There is no lymphadenopathy or thyromegaly.  LUNGS:  Clear.  CARDIAC:  Regular with early to mid peaking systolic ejection murmur at  the right sternal border.  No rubs or gallops.  ABDOMEN:  Obese.  Nontender, nondistended.  No obvious  hepatosplenomegaly.  No bruits.  No masses.  Good bowel sounds.  EXTREMITIES:  Warm with no cyanosis, clubbing or edema.  Distal pulses  are strong.  NEURO:  He is alert and oriented x3.  Cranial nerves II through XII are  intact.  He moves all 4 extremities without difficulty.   EKG shows sinus bradycardia at a rate of 54. No significant ST-T wave  abnormalities.   ASSESSMENT:  1. Aortic stenosis.  He is asymptomatic. He is due for his repeat      echocardiogram in the near  future.  2. Hypertension.  Well controlled.  3. Hyperlipidemia.  This is followed by Dr. Merla [name].  Most recent      cholesterol looked good with a total cholesterol of 173, HDL 43 and      LDL of 105.   DISPOSITION:  Return to clinic in 1 year for routine followup.  He knows  to call me sooner if he has any difficulties.     [name] Buckles. Bensimhon, MD  Electronically Signed    DRB/MedQ  DD: 12/27/2006  DT: 12/28/2006  Job #: [id number]   cc:   [name] [name]. [name] [name], M.D.

## 2010-10-13 NOTE — Op Note (Signed)
NAME:  [name] [name], [name] [name] NO.:  [id number]   MEDICAL RECORD NO.:  [id number]          PATIENT TYPE:  INP   LOCATION:  2303                         FACILITY:  [facility]   PHYSICIAN:  Salvatore Decent. Cornelius Moras, M.D. DATE OF BIRTH:  28-Jan-1951   DATE OF PROCEDURE:  04/19/2007  DATE OF DISCHARGE:                               OPERATIVE REPORT   PREOPERATIVE DIAGNOSES:  1. Severe aortic stenosis.  2. Ascending aortic aneurysm.  3. Persistent atrial fibrillation.   POSTOPERATIVE DIAGNOSES:  1. Severe aortic stenosis.  2. Ascending aortic aneurysm.  3. Persistent atrial fibrillation.   PROCEDURE:  Median sternotomy for aortic valve replacement (25-mm  Edwards Magna pericardial tissue valve), supracoronary resection and  grafting of the ascending thoracic aortic aneurysm and left side Cox-  Maze procedure.   SURGEON:  Dr. Purcell Nails.   ASSISTANT:  Dr. Sheliah Plane.   ANESTHESIA:  General.   CLINICAL NOTE:  The patient is a 60 year old male with longstanding  history of heart murmur and aortic stenosis with an echocardiogram  demonstrating bicuspid native aortic valve.  The patient has been  followed with progressively worsening severity of aortic stenosis.  The  patient developed new-onset atrial fibrillation in August of 2008.  The  patient underwent transesophageal echocardiogram and DC cardioversion  back to sinus rhythm.  Transesophageal echocardiogram confirmed bicuspid  native aortic valve with severe aortic stenosis, normal left ventricular  systolic function and moderate mitral regurgitation.  There was question  raised regarding the possibility of a small perimembranous ventricular  septal defect.  Left and right heart catheterization was subsequently  performed, also confirming the presence of moderate to severe aortic  stenosis.  The patient did not have any significant coronary artery  disease.  In early September, the patient was initially seen in  consultation, and the relative risks and benefits of continued close  observation with medical treatment versus proceeding with surgery at  this juncture have been discussed at length.  The patient now presents  for elective surgical treatment of severe aortic stenosis as well as  recent onset persistent atrial fibrillation.  The patient underwent CT  angiography prior to surgery demonstrating aneurysmal enlargement of the  ascending thoracic aorta with maximum transverse diameter of the aorta  5.0 cm.   OPERATIVE CONSENT:  The patient and his wife have been counseled at  length regarding the indications, risks, and potential benefits of  surgery.  Alternative treatment strategies have been reviewed in detail.  Surgical alternatives with respect to treatment of aortic stenosis and  also been reviewed, and after considerable discussion, the patient  specifically requires that his native aortic valve be replaced using a  bioprosthetic tissue valve in an effort to avoid the need for long-term  anticoagulation with Coumadin.  The patient understands that given his  age there is a significant risk for late structural valve deterioration  and failure potentially requiring repeat surgery at some point in the  distant future due to choice of a bioprosthetic tissue valve.  All of  his questions have been addressed.   OPERATIVE FINDINGS:  1. Bicuspid native aortic valve with severe aortic stenosis and mild      aortic insufficiency.  2. Trace to mild mitral regurgitation.  3. Normal left ventricular systolic function with moderate left      ventricular hypertrophy and significant diastolic dysfunction  4. Aneurysmal enlargement of the ascending thoracic aorta   OPERATIVE NOTE IN DETAIL:  The patient was brought to the operating room  on the above-mentioned date, and central monitoring was established by  the anesthesia service under the care and direction Dr. Diamantina Monks.  Specifically, a  Swan-Ganz catheter was placed through the right internal  jugular approach.  Bilateral radial arterial lines were placed.  Intravenous antibiotics were administered.  Following induction with  general endotracheal anesthesia, a Foley catheter was placed.  The  patient's chest, abdomen, both groins, and both lower extremities were  prepared and draped in a sterile manner.  Baseline transesophageal  echocardiogram was performed by Dr. Katrinka Blazing.  This confirms the presence  of bicuspid native aortic valve with severe aortic stenosis and mild to  moderate aortic insufficiency.  There was trace to mild mitral  regurgitation.  The mitral regurgitation did not appear to be  significant at all.  There was normal left ventricular systolic function  with moderate left ventricular hypertrophy.  No other abnormalities were  noted.   Median sternotomy incision was performed.  The pericardium was opened.  The patient was heparinized systemically.  The transverse aortic arch  was cannulated directly beyond the level of the left carotid artery.  The venous cannula was placed directly in the superior vena cava.  A  second venous cannula was placed low in the right atrium with tip  extending down the inferior vena cava.  A retrograde cardioplegic  catheter was placed through the right atrium into the coronary sinus.  Adequate heparinization was verified.  Cardiopulmonary bypass was begun,  and the surface of the heart was inspected.  There was moderate left  ventricular hypertrophy.  A temperature probe was placed in the left  ventricular septum.  Vessel loops were placed around the superior and  inferior vena cava.  A cardioplegic catheter was placed directly in the  middle of the ascending aorta.   The patient was cooled to 32 degrees systemic temperature.  The aortic  crossclamp was placed immediately proximal to the innominate artery.  At  this level, the aortic arch measures approximately 3 cm in  transverse  diameter.  Cold blood cardioplegia was delivered initially in antegrade  fashion through the aortic root.  Iced saline slush was applied for  topical hypothermia.  Supplemental cardioplegia was administered  retrograde through the coronary sinus catheter.  Repeat doses of  cardioplegia were administered intermittently throughout the crossclamp  portion of the operation, antegrade through the aortic root initially,  and following aortotomy subsequently retrograde through the coronary  sinus catheter to maintain left ventricular septal temperature below 15  degrees centigrade.   The heart was retracted towards the surgeon's side, and the left-sided  pulmonary veins were exposed at the level of their entry into the  pericardial space and left atrium.  The left-sided pulmonary veins were  encircled, and subsequently an elliptical ablation lesion was created  around the base of the left-sided veins on the left atrium using the  Medtronic Cardioblate bipolar irrigated radiofrequency ablation device.  A similar elliptical ablation lesion was created across the base of the  left atrial appendage.  The Medtronic Cardioblate unipolar irrigated  radiofrequency  ablation pen was then utilized to create a linear lesion  joining the ellipse surrounding the base of the left-sided veins with  the ellipse surrounding the base of the left atrial appendage.   The heart was replaced into the pericardial space.  A left atriotomy  incision was performed posteriorly through the interatrial groove.  The  floor of the left atrium was exposed using a self-retaining retractor.  Exposure was felt to be excellent.  An elliptical ablation lesion was  now created around the right-sided pulmonary veins with the anterior  half of the ellipse serving as the atriotomy incision and the posterior  half completed with one limb of the bipolar ablation device along the  endocardial surface and the other limb along  the epicardial surface  posteriorly.  A longitudinal ablation lesion was then created across the  roof of the left atrium joining the cephalad apex of the atriotomy  incision with a cephalad apex of the previously placed elliptical  ablation lesion around the left-sided pulmonary veins.  A similar  parallel lesion was created across the back wall of the left atrium  joining the caudad apex of the atriotomy incision with the caudad apex  of the elliptical lesion around the left-sided pulmonary veins.  A  similar lesion was created from the caudad apex of the atriotomy  incision across the back wall of the left atrium towards the mitral  annulus as far as it will reach.  This lesion was completed along the  endocardial surface with the unipolar irrigated radiofrequency ablation  pen, all the way to the posterior annulus of the mitral valve.  This  completes the left-sided lesion set of the Cox-Maze procedure.   The left atrial appendage was oversewn from within the left atrium using  a two-layer closure of running 3-0 Prolene suture.  The left atriotomy  incision was now closed using a two-layer closure of running 3-0 Prolene  suture.  A left ventricular vent was placed across the mitral valve just  prior to completion of the atriotomy closure and kept in place with a  snare pursestring suture.   The ascending aorta was transected approximately 5 mm below the level of  the aortic crossclamp at the innominate artery.  Proximal end of the  aorta was then transected at the level of the sinotubular junction, and  the ascending aortic aneurysm was sent for routine pathology.  At the  level of the sinotubular junction, the diameter of the aortic root  measures 28 mm.  The aortic valve was inspected.  The aortic valve was  congenitally bicuspid and severely stenotic.  The aortic valve was  excised sharply.  The aortic annulus was decalcified.  This was  technically straightforward.  The  interventricular septum was carefully  examined.  There was no sign of ventricular septal defect.  The aortic  annulus was sized to accept a 25-mm stented bioprosthetic tissue valve.  Aortic valve replacements were performed using interrupted 2-0 Ethibond  horizontal mattress pledgeted sutures with pledgets in the subannular  position.  Initially, a standard Edwards pericardial tissue valve was  secured in place, but the middle strut of this valve appears to be bent,  causing some buckling of one of the valve leaflets.  This valve was  removed, and subsequently aortic valve replacement was performed using  an [employer] series pericardial tissue valve (size 25 mm, model  number 3000, serial number [id number]).  This valve was seated uneventfully  and appears to  function normally.  Rewarming was begun.   A 28-mm Hemashield woven velour platinum aortic graft was chosen for  replacement of the ascending thoracic aorta.  The proximal anastomoses  of this graft was constructed to the sinotubular junction of the native  aortic root using interrupted 2-0 Ethibond horizontal mattress pledgeted  sutures.  This anastomoses was reinforced with BioGlue.  The graft was  then trimmed and beveled to an appropriate length, and the distal  anastomoses was sewn directly to the distal portion of the aorta using  running 4-0 Prolene suture.   A Magoon  needle was placed directly in the ascending aortic graft to  facilitate evacuation of any residual air through the aortic root.  One  final dose of warm retrograde hot shot cardioplegia was administered.  The lungs were ventilated and the heart allowed to fill to evacuate any  residual air through the aortic root.  The aortic crossclamp was removed  after a total crossclamp time of 185 minutes.   The retrograde cardioplegic catheter was removed.  The heart was  defibrillated back into a slow junctional escape rhythm.  The heart  fibrillates several  additional times requiring an additional dose of  lidocaine and defibrillation.  Subsequently, a stable rhythm resumes.  Epicardial pacing wires were affixed to the right ventricular free wall  and to the right atrial appendage.  The patient was rewarmed to 37  degrees centigrade temperature.  Low-dose dobutamine infusion was begun.  The left ventricular vent was removed and its cannulation suture  secured.  The IVC cannula was removed and its cannulation site oversewn  with Prolene suture.  The patient was weaned from cardiopulmonary bypass  without difficulty.  The patient's rhythm at separation from bypass was  a slow junctional escape rhythm.  AV sequential pacing was employed.  Normal sinus rhythm resumes spontaneously prior to completion of the  operation, and atrial pacing was subsequently employed.  The SVC cannula  was removed.  Follow-up transesophageal echocardiogram was performed by  Dr. Katrinka Blazing after separation from bypass.  This demonstrated normal left  ventricular systolic function.  There was a well-seated bioprosthetic  tissue valve in the aortic position with no aortic regurgitation.  There  was trivial mitral regurgitation.  There was no residual air.  No other  abnormalities were noted.   The aortic root vent was removed.  The aortic cannula was removed and  its cannulation site reinforced with 3-0 Prolene suture.  Protamine was  administered to reverse the anticoagulation.  The mediastinum was  irrigated with saline solution containing vancomycin.  Meticulous  surgical hemostasis was ascertained.   The mediastinum was drained using two chest tubes exited through  separate stab incisions inferiorly.  The pericardium and soft tissues  anterior to the aorta were reapproximated loosely.  The sternum was  closed with double-strength sternal wire.  The soft tissues anterior to  the sternum were closed in multiple layers, and the skin was closed with  a running subcuticular  skin closure.   The patient tolerated the procedure well and was transported to the  surgical intensive care unit in stable condition.  There were no  intraoperative complications.  All sponge, instrument and needle counts  were verified as correct at completion of the operation.  No blood  products were administered.      Salvatore Decent. Cornelius Moras, M.D.  Electronically Signed     CHO/MEDQ  D:  04/19/2007  T:  04/20/2007  Job:  [id number]  cc:   Bevelyn Buckles. Bensimhon, MD  Harrel Lemon. Merla Riches, M.D.

## 2010-10-13 NOTE — Assessment & Plan Note (Signed)
OFFICE VISIT   [name] [name] A  DOB:  [birth year]                                        April 03, 2007  CHART #:  [id number]   HISTORY OF PRESENT ILLNESS:  The patient returns for further followup  with tentative plans for elective surgery on Wednesday, April 05, 2007.  He was last seen here in the office on February 27, 2007.  He  stopped taking Coumadin this past Friday in anticipation of his surgery.  He also started taking amiodarone, but subsequent to this he has  developed a mild diffuse erythematous rash on both arms and his anterior  chest.  He has had symptoms of mild sinus congestion over the last week,  and his wife apparently had a viral illness last month.  He otherwise  feels well.  He denies any productive cough.  He denies any fevers or  chills.  He denies any shortness of breath or chest discomfort.  The  remainder of his review of systems is unrevealing.  The remainder of his  past medical history is unchanged.   PHYSICAL EXAM:  Notable for a well-appearing male with blood pressure  104/72.  Pulse 58.  Oxygen saturation 100% on room air.  Breath sounds  are clear to auscultation anteriorly and posteriorly.  No wheezes or  rhonchi are noted.  Cardiovascular:  Exam reveals regular rate and  rhythm with a prominent grade 3/6 systolic murmur.  Abdomen:  Soft and  nontender.  Extremities:  Warm and well perfused.  There is a very mild,  fine, diffuse, erythematous rash on both arms, more so on the right than  the left and to a lesser degree on the anterior chest.  The remainder of  his physical exam is unrevealing.   IMPRESSION:  Probable mild rash related to amiodarone therapy.  The  patient also has mild symptoms of upper sinus congestion, but his lungs  are clear and he denies productive cough or fevers or chills.  He has  not had a sore throat.  We discussed whether or not to proceed with  surgery and the patient is eager to proceed  with surgery and does not  wish to postpone unless it is clearly necessary.   PLAN:  I have instructed the patient to stop taking amiodarone.  He is  to have routine preoperative blood work and a chest x-ray today and we  will follow up on the results of these tests.  Presuming these are all  normal, we will plan to proceed with surgery on November 5th as  previously scheduled for aortic valve replacement using a bioprosthetic  tissue valve.  Resection and grafting of ascending thoracic aorta,  possible mitral valve repair, and a MAZE procedure.  The patient and his  wife understand and accept all potential associated risks of surgery  including but limited to risk of death, stroke, myocardial infarction,  congestive heart failure, respiratory failure, pneumonia, bleeding  requiring blood transfusion, arrhythmia, infection, and/or late  complications related to bioprosthetic tissue valve replacement.  They  also understand the possibility for continued problems with atrial  arrhythmias in the future even following MAZE procedure.  All of their  questions have been addressed.   [name] [name], M.D.  Electronically Signed   CHO/MEDQ  D:  04/03/2007  T:  04/04/2007  Job:  [id number]   cc:   [name] Buckles. Bensimhon, MD  [name] [name], M.D.

## 2010-10-13 NOTE — Assessment & Plan Note (Signed)
[name]                            CARDIOLOGY OFFICE NOTE   [name] [name], [name] [name]                        MRN:          [id number]  DATE:03/29/2007                            DOB:          09-26-1950    PRIMARY CARE PHYSICIAN:  Dr. Merla Riches   INTERVAL HISTORY:  [name] [name] is a delightful 60 year old male with a history  of bicuspid aortic valve and moderate severe aortic stenosis as well as  hypertension, hyperlipidemia, obstructive sleep apnea, obesity and  atrial fibrillation status post cardioversion.  He returns today for  routine followup.   He is doing well.  He denies any chest pain, he has had some shortness  of breath.  He has not had any palpitations or lower extremity edema, no  orthopnea.  He saw Dr. Cornelius Moras and is scheduled for aortic valve  replacement with a Maze procedure next Wednesday.  He is understandably  significantly anxious about this.   CURRENT MEDICATIONS:  Toprol XL 50 mg daily, Caduet 5/20, Cozaar 100,  fish oil and Coumadin.   PHYSICAL EXAMINATION:  GENERAL:  He is well-appearing, in no acute  distress.  Ambulates around the clinic without any respiratory  difficulty.  VITAL SIGNS:  Blood pressure is 91/62, heart rate is 52, weight is 281.  HEENT:  Normal.  NECK:  Supple, there is no JVD. Carotids are 2+ bilaterally with  bilateral bruits. There is no lymphadenopathy, thyromegaly.  CARDIAC:  He has a regular rate and rhythm with a 2-3/6 systolic  ejection murmur at the right sternal border. S2 is moderately depressed,  there is no rub or gallop.  LUNGS:  Clear.  ABDOMEN:  Obese, nontender, nondistended.  No obvious  hepatosplenomegaly, no bruits, no masses. Good bowel sounds.  EXTREMITIES:  Are warm with no clubbing, cyanosis or edema. No rash.  NEURO:  Alert and oriented x3. Cranial nerves II-XII grossly intact.  Moves all four extremities without difficulty. Affect is pleasant.   EKG shows sinus bradycardia at a rate  of 52, there is poor R wave  progression.   ASSESSMENT AND PLAN:  1. Aortic stenosis in the setting of bicuspid aortic valve. He is due      for a valve replacement next week. We will start him on amiodarone      preoperatively to avoid recurrent atrial fibrillation.  2. Atrial fibrillation.  He maintains sinus rhythm, as above will      start him on amiodarone preoperatively.  He will also undergo a Cox      Maze procedure.  3. Hyperlipidemia - continue Caduet.   DISPOSITION:  Will see him back postoperatively.     Bevelyn Buckles. Bensimhon, MD  Electronically Signed    DRB/MedQ  DD: 03/29/2007  DT: 03/30/2007  Job #: [id number]   cc:   [name] [name]. Merla Riches, M.D.  Salvatore Decent. Cornelius Moras, M.D.

## 2010-10-16 NOTE — Op Note (Signed)
[place]. [facility]  Patient:    RIVER, AMBROSIO Visit Number: [id number] MRN: [id number]          Service Type: DSU Location: [facility] 2550 05 Attending Physician:  Arlis Porta Dictated by:   Adolph Pollack, M.D. Proc. Date: 06/13/01 Admit Date:  06/13/2001   CC:         Anselmo Rod, M.D.  Talmadge Coventry, M.D.   Operative Report  PREOPERATIVE DIAGNOSES: 1. Prolapsing internal hemorrhoids with condylomatous changes. 2. Anal and perianal condyloma.  POSTOPERATIVE DIAGNOSES: 1. Prolapsing internal hemorrhoids with condylomatous changes. 2. Anal and perianal condyloma.  PROCEDURES: 1. Internal and external hemorrhoidectomy x3. 2. Excision of anal condyloma with fulguration of anal condyloma.  SURGEON:  Adolph Pollack, M.D.  ANESTHESIA:  General.  INDICATION:  Mr. Releford is a 60 year old male with a long history of problems with hemorrhoids.  He has minimal pain but has some significant bleeding from them.  Specifically, he say during times of stress he does have intermittent constipation.  He has had a colonoscopy by Dr. Loreta Ave, who has demonstrated these hemorrhoids, and now he presents for hemorrhoidectomy.  On exam I also noted some condylomatous changes on the hemorrhoids and around them.  He underwent preoperative cardiac clearance and has been given preoperative IV Cefotan.  DESCRIPTION OF PROCEDURE:  He was placed supine on the operating table, and a general anesthetic was administered.  He was then placed in the lithotomy position.  The perianal area was sterilely prepped and draped.  There were prolapsing internal hemorrhoids and external components in the right anterolateral, the right posterolateral, and the left lateral regions.  I began by grasping the prolapsed right internal hemorrhoids with its external component and condylomatous changes, using the cautery to excise this.  This was the first specimen  labeled.  I cauterized the base of the area.  Next I grasped the left lateral hemorrhoid and likewise cauterized the base of the hemorrhoid and excised it just using the cautery.  Thirdly, I did the same with the right posterolateral hemorrhoidal/condylomatous complex by excising it with the cautery.  I then reinspected the right posterolateral area, and there was still some hemorrhoidal complex left.  I subsequently ligated this hemorrhoidal pedicle with 3-0 chromic suture.  I excised it sharply, then reapproximated the inner mucosa with a running locking 3-0 chromic suture.  I then examined the perianal area and saw some small condylomatous-type lesions that I fulgurated.  I inspected the area, and hemostasis was adequate. I then performed a perianal block with 0.5% Marcaine with epinephrine. Gelfoam was placed into the anal canal and a bulky dressing was applied.  He tolerated the procedure well without any apparent complications and then was taken to the recovery room in satisfactory condition. Dictated by:   Adolph Pollack, M.D. Attending Physician:  Arlis Porta DD:  06/13/01 TD:  06/13/01 Job: [id number] [id number]

## 2010-10-16 NOTE — Discharge Summary (Signed)
[place]. [facility]  Patient:    [name] [name], [name] [name] Visit Number: [id number] MRN: [id number]          Service Type: MED Location: [phone] Attending Physician:  Virgina Evener Dictated by:   Halford Decamp Delanna Ahmadi, R.N., N.P. Admit Date:  10/21/2001 Discharge Date: 10/22/2001                             Discharge Summary  HISTORY OF PRESENT ILLNESS: The patient is a 60 year old white married male who came to the ER after a spell of severe diaphoresis and clamminess.  He also looked pale per his wife.  He had a thready pulse.  He was at a party and Dr. Kinnie Scales was there.  The patient had the sudden onset of severe diaphoresis. His shirt became soaking wet.  He denied any chest pain.  No tachy palpitations.  He was attended by Dr. Kinnie Scales who said he had a thready pulse and recommended he go to the emergency room.  He apparently has no prior history of atherosclerotic coronary vascular disease.  He had a prior workup with an echo and a treadmill Cardiolite about a year ago which were both negative.  These were done secondary to an abnormal Cardiolite.  He also had Dopplers done because of hypertension resistant on multiple medications.  He has about a 30% renal artery stenosis per Dopplers.  His cardiac risk factors are hypertension, age and hyperlipidemia.  His blood pressure was initially 163/97 and it came down to 141/76 and pulse was 62.  His EKG did not show any ischemia.  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  It was decided that he should be admitted and ruled out for a MI.  HOSPITAL COURSE: On the morning of Oct 22, 2001 he had had no further episodes of chest pain, no diaphoresis, no dizziness and no pre-syncope. His blood pressure had ranged from 134/76 to 155/73.  His heart rate was 66 and it did drop down to 50 during the night. His respirations were 20.  Temperature was 97.8.  His room air saturations were 94%.  His CK, MB and  troponin levels were negative times two.  It was decided that he could be sent home. It was also decided that we would change his Tiazac to Norvasc and he will start that on the day after discharge.  He will call the office to get an appointment with Dr. Tresa Endo on November 01, 2001.  LABORATORIES:  Hemoglobin and hematocrit were both within normal limits.  BUN and creatinine were both within normal limits.  His first CK was slightly elevated at 238; however, his MB was only 1.6.  The second CK was 159, MB was 2.5 and troponin was 0.01 times two.  A chest x-ray showed no acute distress.  DISCHARGE MEDICATIONS: 1. Stop Cardizem. 2. Start Norvasc 5 mg once per day. 3. Toprol XL 50 mg once per day. 4. Lipitor 20 mg once per day. 5. Hyzaar 100/25 mg once a day. 6. Zoloft 50 mg once per day. 7. Baby aspirin once per day.  ACTIVITIES: As tolerated. He was told not to do any exercising with his personal trainer for several days.  If he has anymore diaphoretic symptoms, he will give our office a call.  FOLLOW-UP: He will follow-up with Dr. Tresa Endo on June 4.  He will call the office and schedule  the appointment on Tuesday.  DISCHARGE DIAGNOSES: 1. Diaphoretic episode, questionable vagal episode versus arrhythmia.    However, he had no arrhythmia while here on telemetry except for sinus    bradycardia. 2. Hypertension with an elevated systolic pressure. He has had Dopplers    done in the past showing a 30% renal artery stenosis. 3. Hyperlipidemia on medication.  2. Dictated by:   Halford Decamp. Delanna Ahmadi, R.N., N.P. Attending Physician:  Virgina Evener DD:  10/22/01 TD:  10/24/01 Job: [id number]

## 2010-10-16 NOTE — H&P (Signed)
NAME:  [name] [name], [name] [name] NO.:  [id number]   MEDICAL RECORD NO.:  [id number]          PATIENT TYPE:  INP   LOCATION:  2006                         FACILITY:  [facility]   PHYSICIAN:  Salvatore Decent. Cornelius Moras, M.D. DATE OF BIRTH:  December 22, 1950   DATE OF ADMISSION:  04/19/2007  DATE OF DISCHARGE:  04/24/2007                              HISTORY & PHYSICAL   HISTORY OF PRESENT ILLNESS:  It should be noted that this dictation is  to replace a previously handwritten history and physical exam that was  placed in the patient's chart at the time of his admission and  apparently has been lost from the hospital medical records.  The patient  is a 60 year old male with severe aortic stenosis, ascending thoracic  aortic aneurysm, and persistent atrial fibrillation.  A full outpatient  consultation report has been dictated previously documenting the  patient's original history and presentation, and this note is available  through the hospital electronic medical records system.  The patient has  been followed as an outpatient in the office and now returns for  elective surgical treatment.  The remainder of the patient's history has  been well documented previously, and during the interim period of time  since his last office visit, no new interval problems have developed.   REVIEW OF SYSTEMS:  Unchanged from previously.   PAST MEDICAL HISTORY:  Unchanged from previously.   SOCIAL HISTORY:  Unchanged from previously.   FAMILY HISTORY:  Unchanged from previously.   MEDICATIONS PRIOR TO ADMISSION:  1. Cozaar 100 mg daily  2. Caduet 5/20, 1 tablet daily  3. Metoprolol 50 mg daily.   DRUG ALLERGIES:  None known.   PHYSICAL EXAMINATION:  Unchanged from previously.   IMPRESSION:  1. Severe aortic stenosis.  2. The ascending thoracic aortic aneurysm.  3. Persistent atrial fibrillation.   PLAN:  Aortic valve replacement, with resection and grafting of  ascending thoracic aortic aneurysm,  and maze procedure.      Salvatore Decent. Cornelius Moras, M.D.  Electronically Signed     CHO/MEDQ  D:  08/31/2007  T:  09/01/2007  Job:  [id number]

## 2010-10-16 NOTE — Assessment & Plan Note (Signed)
[facility]                            CARDIOLOGY OFFICE NOTE   ARIES, TOWNLEY                        MRN:          [id number]  DATE:04/28/2006                            DOB:          05/20/1951    Thursday, April 28, 2006   PRIMARY CARE PHYSICIAN:  Rosalyn Gess. Norins, MD.   PATIENT IDENTIFICATION:  [name] [name] is a delightful 60 year old male  here for routine follow-up.   PROBLEM LIST:  1. Moderate aortic stenosis.      a.     Echocardiogram  February 2007, showed an ejection fraction       of 65-75%.  There was moderate aortic valvular stenosis with a       mean gradient of 24 mmHg and a valve area of 1.29 sq cm.  2. Hypertension.  3. Hyperlipidemia.  4. Negative functional study February 2007, ejection fraction 65% with      no ischemia or infarction.  5. Obstructive sleep apnea, noncompliant with CPAP.  6. Obesity.  7. Remote history of tobacco use.   CURRENT MEDICATIONS:  1. Toprol 50 a day.  2. Caduet 5/20.  3. Cozaar 100 a day.  4. Multivitamin and Fish Oil.   ALLERGIES:  BEE STINGS.   INTERVAL HISTORY:  [name] [name] returns today for routine follow-up.  He  is doing quite well.  Denies any chest pain or shortness of breath.  He  is walking on the treadmill for 40 minutes 3-4 times a week at 3.5 to 4  miles an hour on a slight incline with no exertional symptoms.  He has  not had any heart failure.  He has had a bad cold for the past week and  does have a significant amount of stress as recently got a new boss at  work.  Not had any heart failure symptoms.   PHYSICAL EXAMINATION:  GENERAL APPEARANCE:  He is well-appearing with  significant amount of nasal congestion.  Ambulates around the clinic  without difficult.  VITAL SIGNS:  Blood pressure 115/73, heart rate 50, weight 300 pounds.  HEENT:  Sclerae are anicteric.  EOMI.  There are scattered xanthelasma.  Moist mucous membranes.  Oropharynx is clear.  NECK:  Supple.   No JVD, carotids are 2+ bilaterally without any bruits.  There is no lymphadenopathy or thyromegaly.  LUNGS:  Clear.  CARDIOVASCULAR:  He is bradycardic with an early peaking 2/6 systolic  murmur at the right sternal border.  No gallop or rubs.  ABDOMEN:  Obese, nontender, nondistended.  There is no obvious  hepatosplenomegaly.  No bruits, no masses. Good bowel sounds.  EXTREMITIES:  Warm with no significant clubbing, cyanosis, or edema.  Distal pulses are strong.  NEUROLOGIC:  He is alert and oriented x3.  Cranial nerves II-XII intact.  Moves all four extremities without difficulty.   EKG shows sinus bradycardia, rate of 50 with no STT wave abnormalities.   ASSESSMENT/PLAN:  1. Aortic stenosis.  This is moderate, is asymptomatic, we will      continue with yearly echocardiograms for follow-up.  2. Hypertension, well  controlled.  3. Hyperlipidemia.  This is followed by Dr. Debby Bud.   DISPOSITION:  Return to clinic in nine months for routine follow-up.     Bevelyn Buckles. Bensimhon, MD  Electronically Signed    DRB/MedQ  DD: 04/28/2006  DT: 04/29/2006  Job #: [id number]   cc:   Rosalyn Gess. Norins, MD

## 2011-03-09 LAB — POCT I-STAT 4, (NA,K, GLUC, HGB,HCT)
Glucose, Bld: 114 — ABNORMAL HIGH
Glucose, Bld: 115 — ABNORMAL HIGH
Glucose, Bld: 119 — ABNORMAL HIGH
Glucose, Bld: 98
HCT: 28 — ABNORMAL LOW
HCT: 29 — ABNORMAL LOW
HCT: 29 — ABNORMAL LOW
HCT: 31 — ABNORMAL LOW
HCT: 33 — ABNORMAL LOW
Hemoglobin: 12.6 — ABNORMAL LOW
Hemoglobin: 13.6
Hemoglobin: 9.5 — ABNORMAL LOW
Operator id: 300131
Operator id: 3342
Operator id: 3342
Operator id: 3342
Operator id: 3342
Potassium: 4.1
Potassium: 4.2
Potassium: 4.3
Potassium: 5.5 — ABNORMAL HIGH
Potassium: 5.8 — ABNORMAL HIGH
Sodium: 134 — ABNORMAL LOW
Sodium: 135
Sodium: 138
Sodium: 140

## 2011-03-09 LAB — I-STAT EC8
Acid-base deficit: 3 — ABNORMAL HIGH
BUN: 19
Chloride: 107
HCT: 34 — ABNORMAL LOW
Hemoglobin: 10.9 — ABNORMAL LOW
Operator id: 297351
Potassium: 4.2
Sodium: 139
TCO2: 24
pCO2 arterial: 39
pH, Arterial: 7.339 — ABNORMAL LOW

## 2011-03-09 LAB — CBC
HCT: 30.8 — ABNORMAL LOW
HCT: 33 — ABNORMAL LOW
HCT: 42.5
Hemoglobin: 10.5 — ABNORMAL LOW
Hemoglobin: 10.6 — ABNORMAL LOW
Hemoglobin: 14.2
Hemoglobin: 14.2
MCHC: 33.4
MCHC: 33.7
MCHC: 33.8
MCHC: 34.3
MCV: 88.6
MCV: 88.8
MCV: 89.3
MCV: 86.5
Platelets: 148 — ABNORMAL LOW
Platelets: 174
Platelets: 311
RBC: 3.48 — ABNORMAL LOW
RBC: 3.54 — ABNORMAL LOW
RBC: 3.71 — ABNORMAL LOW
RBC: 3.79 — ABNORMAL LOW
RBC: 4.82
RBC: 4.78
RDW: 14.2
RDW: 14.2
RDW: 14.3
WBC: 11.5 — ABNORMAL HIGH
WBC: 12.6 — ABNORMAL HIGH
WBC: 14.6 — ABNORMAL HIGH
WBC: 14.8 — ABNORMAL HIGH
WBC: 14.9 — ABNORMAL HIGH
WBC: 8.5
WBC: 12.2 — ABNORMAL HIGH

## 2011-03-09 LAB — POCT I-STAT 3, ART BLOOD GAS (G3+)
Acid-base deficit: 3 — ABNORMAL HIGH
Acid-base deficit: 3 — ABNORMAL HIGH
Bicarbonate: 23.1
O2 Saturation: 91
O2 Saturation: 95
Patient temperature: 35.1
Patient temperature: 37.1
TCO2: 23
TCO2: 24
TCO2: 24
pCO2 arterial: 39
pCO2 arterial: 42.5
pH, Arterial: 7.36
pO2, Arterial: 235 — ABNORMAL HIGH
pO2, Arterial: 278 — ABNORMAL HIGH

## 2011-03-09 LAB — URINALYSIS, ROUTINE W REFLEX MICROSCOPIC
Bilirubin Urine: NEGATIVE
Bilirubin Urine: NEGATIVE
Glucose, UA: NEGATIVE
Glucose, UA: NEGATIVE
Hgb urine dipstick: NEGATIVE
Hgb urine dipstick: NEGATIVE
Ketones, ur: NEGATIVE
Nitrite: NEGATIVE
Specific Gravity, Urine: 1.018
Specific Gravity, Urine: 1.023

## 2011-03-09 LAB — BASIC METABOLIC PANEL
BUN: 16
Calcium: 7.9 — ABNORMAL LOW
Calcium: 8.5
Chloride: 103
Chloride: 106
Creatinine, Ser: 1
Creatinine, Ser: 1.2
Creatinine, Ser: 1.26
GFR calc Af Amer: >60
GFR calc non Af Amer: >60
GFR calc non Af Amer: >60
Glucose, Bld: 125 — ABNORMAL HIGH
Glucose, Bld: 97
Sodium: 138

## 2011-03-09 LAB — MAGNESIUM
Magnesium: 2.5
Magnesium: 2.6 — ABNORMAL HIGH
Magnesium: 3.1 — ABNORMAL HIGH

## 2011-03-09 LAB — TYPE AND SCREEN
ABO/RH(D): O POS
Antibody Screen: NEGATIVE

## 2011-03-09 LAB — BLOOD GAS, ARTERIAL
Acid-base deficit: 0.1
Acid-base deficit: 0.9
Bicarbonate: 22.8
FIO2: 0.21
O2 Saturation: 98.5
O2 Saturation: 97.6
Patient temperature: 98.6
TCO2: 24.5
TCO2: 23.9
pCO2 arterial: 35.3
pO2, Arterial: 90.2

## 2011-03-09 LAB — COMPREHENSIVE METABOLIC PANEL
ALT: 27
ALT: 24
AST: 24
AST: 23
Albumin: 4
CO2: 23
Calcium: 9.6
Chloride: 106
Chloride: 106
Creatinine, Ser: 1.18
GFR calc Af Amer: >60
GFR calc non Af Amer: >60
GFR calc non Af Amer: >60
Glucose, Bld: 96
Sodium: 137
Total Bilirubin: 0.8

## 2011-03-09 LAB — HEMOGLOBIN A1C: Hgb A1c MFr Bld: 5.3

## 2011-03-09 LAB — CROSSMATCH
ABO/RH(D): O POS
Antibody Screen: NEGATIVE

## 2011-03-09 LAB — PROTIME-INR
INR: 1.2
INR: 1.4
INR: 1.2
Prothrombin Time: 12.9
Prothrombin Time: 15.1
Prothrombin Time: 15.2
Prothrombin Time: 15.6 — ABNORMAL HIGH
Prothrombin Time: 15.6 — ABNORMAL HIGH

## 2011-03-09 LAB — POCT I-STAT GLUCOSE
Glucose, Bld: 121 — ABNORMAL HIGH
Operator id: 3342

## 2011-03-09 LAB — CREATININE, SERUM
GFR calc Af Amer: >60
GFR calc non Af Amer: >60

## 2011-03-09 LAB — PLATELET COUNT: Platelets: 258

## 2011-03-09 LAB — ABO/RH: ABO/RH(D): O POS

## 2011-03-12 LAB — POCT I-STAT 3, VENOUS BLOOD GAS (G3P V)
Acid-base deficit: 4 — ABNORMAL HIGH
O2 Saturation: 61
Operator id: 211741
pCO2, Ven: 36.2 — ABNORMAL LOW
pH, Ven: 7.311 — ABNORMAL HIGH
pO2, Ven: 35

## 2011-03-12 LAB — CBC
HCT: 43.8
Hemoglobin: 12.2 — ABNORMAL LOW
Hemoglobin: 14.7
MCHC: 34.3
Platelets: 253
Platelets: 295
RBC: 4.11 — ABNORMAL LOW
RBC: 5
WBC: 7.7
WBC: 8.2
WBC: 8.3
WBC: 9.9

## 2011-03-12 LAB — PROTIME-INR
INR: 1.1
INR: 1.7 — ABNORMAL HIGH
INR: 1.9 — ABNORMAL HIGH
Prothrombin Time: 14.4
Prothrombin Time: 22.5 — ABNORMAL HIGH

## 2011-03-12 LAB — BASIC METABOLIC PANEL
CO2: 26
Calcium: 9.1
Creatinine, Ser: 1.11
GFR calc Af Amer: >60
GFR calc non Af Amer: >60
Sodium: 141

## 2011-03-12 LAB — HEPARIN LEVEL (UNFRACTIONATED)
Heparin Unfractionated: 0.65
Heparin Unfractionated: 0.66

## 2011-03-12 LAB — COMPREHENSIVE METABOLIC PANEL
ALT: 26
Alkaline Phosphatase: 88
CO2: 25
Chloride: 106
GFR calc non Af Amer: 51 — ABNORMAL LOW
Glucose, Bld: 92
Potassium: 4.3
Sodium: 139
Total Bilirubin: 0.7
Total Protein: 6.3

## 2011-03-12 LAB — CARDIAC PANEL(CRET KIN+CKTOT+MB+TROPI)
CK, MB: 1.4
CK, MB: 1.4
Relative Index: 1.3
Relative Index: INVALID
Total CK: 102
Total CK: 85
Troponin I: 0.01
Troponin I: 0.01

## 2011-03-12 LAB — POCT I-STAT 3, ART BLOOD GAS (G3+)
Acid-base deficit: 2
Bicarbonate: 21.6
O2 Saturation: 92
Operator id: 211741

## 2011-03-12 LAB — LIPID PANEL: Cholesterol: 155

## 2011-03-12 LAB — T4, FREE: Free T4: 1.25

## 2011-03-12 LAB — TSH: TSH: 1.149

## 2011-03-12 LAB — T3 UPTAKE: T3 Uptake Ratio: 48.6 — ABNORMAL HIGH

## 2011-05-19 ENCOUNTER — Other Ambulatory Visit: Payer: Self-pay | Admitting: Internal Medicine

## 2011-05-31 ENCOUNTER — Other Ambulatory Visit: Payer: Self-pay | Admitting: Internal Medicine

## 2011-06-02 ENCOUNTER — Telehealth: Payer: Self-pay | Admitting: Internal Medicine

## 2011-06-02 NOTE — Telephone Encounter (Signed)
Pt needs refill on toprolol xl 50mg  qd and cadoweit 5/20mg  qd [facility] pt is out needs this called in asap

## 2011-06-02 NOTE — Telephone Encounter (Signed)
This has been sent in,  Processed 05/31/2011 by Desiree Lucy, CMA.  Judithe Modest, CMA

## 2011-06-04 ENCOUNTER — Telehealth: Payer: Self-pay | Admitting: Internal Medicine

## 2011-06-04 MED ORDER — AMLODIPINE-ATORVASTATIN 5-20 MG PO TABS: 1.0000 | ORAL_TABLET | Freq: Every day | ORAL | Status: DC

## 2011-06-04 MED ORDER — METOPROLOL SUCCINATE ER 50 MG PO TB24: 50.0000 mg | ORAL_TABLET | Freq: Every day | ORAL | Status: DC

## 2011-06-04 NOTE — Telephone Encounter (Signed)
Refills reordered.  Pt is inquiring as to whether or not he will be continuing to see Dr Gala Romney.  Message sent to Dr Gala Romney to find out.  I told [name] [name] I would call him when I find out.

## 2011-06-04 NOTE — Telephone Encounter (Signed)
New msg Pt said he requested med toprol and caduet  to be refilled 3 times and [facility] still says they have not received request please let him know

## 2011-06-07 NOTE — Telephone Encounter (Signed)
Pt was notified.  

## 2011-06-07 NOTE — Telephone Encounter (Signed)
Per Dr Gala Romney.  He will continue to see [name] [name] at [facility].

## 2011-08-06 ENCOUNTER — Encounter (HOSPITAL_COMMUNITY): Payer: Self-pay | Admitting: *Deleted

## 2011-10-04 ENCOUNTER — Ambulatory Visit (INDEPENDENT_AMBULATORY_CARE_PROVIDER_SITE_OTHER): Payer: BC Managed Care – PPO | Admitting: Family Medicine

## 2011-10-04 VITALS — BP 167/96 | HR 76 | Temp 98.0°F | Resp 22 | Ht 68.5 in | Wt 321.0 lb

## 2011-10-04 DIAGNOSIS — J3489 Other specified disorders of nose and nasal sinuses: Secondary | ICD-10-CM

## 2011-10-04 DIAGNOSIS — R0981 Nasal congestion: Secondary | ICD-10-CM

## 2011-10-04 DIAGNOSIS — J069 Acute upper respiratory infection, unspecified: Secondary | ICD-10-CM

## 2011-10-04 MED ORDER — HYDROCODONE-HOMATROPINE 5-1.5 MG/5ML PO SYRP: 5.0000 mL | ORAL_SOLUTION | Freq: Three times a day (TID) | ORAL | Status: AC | PRN

## 2011-10-04 MED ORDER — AZITHROMYCIN 250 MG PO TABS: ORAL_TABLET | ORAL | Status: AC

## 2011-10-04 NOTE — Progress Notes (Signed)
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  Exposed to coughing wife who was exposed to sick child  O:  HEENT:  Injected eyes, red throat Chest:  ronchi  A:  URI  P:  z pak and hydromet

## 2011-11-03 ENCOUNTER — Ambulatory Visit (INDEPENDENT_AMBULATORY_CARE_PROVIDER_SITE_OTHER): Payer: BC Managed Care – PPO | Admitting: Internal Medicine

## 2011-11-03 ENCOUNTER — Other Ambulatory Visit: Payer: Self-pay | Admitting: Internal Medicine

## 2011-11-03 ENCOUNTER — Encounter: Payer: Self-pay | Admitting: Internal Medicine

## 2011-11-03 VITALS — BP 149/93 | HR 64 | Temp 98.1°F | Resp 16 | Ht 68.5 in | Wt 320.4 lb

## 2011-11-03 DIAGNOSIS — Z0000 Encounter for general adult medical examination without abnormal findings: Secondary | ICD-10-CM

## 2011-11-03 DIAGNOSIS — Z6838 Body mass index (BMI) 38.0-38.9, adult: Secondary | ICD-10-CM | POA: Insufficient documentation

## 2011-11-03 DIAGNOSIS — E785 Hyperlipidemia, unspecified: Secondary | ICD-10-CM

## 2011-11-03 DIAGNOSIS — I10 Essential (primary) hypertension: Secondary | ICD-10-CM

## 2011-11-03 DIAGNOSIS — E782 Mixed hyperlipidemia: Secondary | ICD-10-CM

## 2011-11-03 DIAGNOSIS — I359 Nonrheumatic aortic valve disorder, unspecified: Secondary | ICD-10-CM

## 2011-11-03 DIAGNOSIS — Z6841 Body Mass Index (BMI) 40.0 and over, adult: Secondary | ICD-10-CM

## 2011-11-03 LAB — POCT URINALYSIS DIPSTICK
Bilirubin, UA: NEGATIVE
Glucose, UA: NEGATIVE
Leukocytes, UA: NEGATIVE
Nitrite, UA: NEGATIVE
Urobilinogen, UA: 0.2

## 2011-11-03 LAB — COMPREHENSIVE METABOLIC PANEL
ALT: 33 U/L (ref 0–53)
Albumin: 4.5 g/dL (ref 3.5–5.2)
Alkaline Phosphatase: 115 U/L (ref 39–117)
CO2: 23 mEq/L (ref 19–32)
Glucose, Bld: 104 mg/dL — ABNORMAL HIGH (ref 70–99)
Potassium: 4.2 mEq/L (ref 3.5–5.3)
Sodium: 140 mEq/L (ref 135–145)
Total Bilirubin: 0.6 mg/dL (ref 0.3–1.2)
Total Protein: 6.9 g/dL (ref 6.0–8.3)

## 2011-11-03 LAB — CBC WITH DIFFERENTIAL/PLATELET
Basophils Relative: 0 % (ref 0–1)
Eosinophils Absolute: 0.5 10*3/uL (ref 0.0–0.7)
Hemoglobin: 14.9 g/dL (ref 13.0–17.0)
Lymphs Abs: 1.7 10*3/uL (ref 0.7–4.0)
MCH: 28.5 pg (ref 26.0–34.0)
Monocytes Relative: 8 % (ref 3–12)
Neutro Abs: 5.6 10*3/uL (ref 1.7–7.7)
Neutrophils Relative %: 66 % (ref 43–77)
Platelets: 274 10*3/uL (ref 150–400)
RBC: 5.23 MIL/uL (ref 4.22–5.81)

## 2011-11-03 LAB — LIPID PANEL
LDL Cholesterol: 93 mg/dL (ref 0–99)
VLDL: 23 mg/dL (ref 0–40)

## 2011-11-03 MED ORDER — AMLODIPINE-ATORVASTATIN 10-20 MG PO TABS: 1.0000 | ORAL_TABLET | Freq: Every day | ORAL | Status: DC

## 2011-11-03 MED ORDER — METOPROLOL SUCCINATE ER 50 MG PO TB24: 50.0000 mg | ORAL_TABLET | Freq: Every day | ORAL | Status: DC

## 2011-11-03 NOTE — Progress Notes (Signed)
Subjective:    Patient ID: [name] [name], male    DOB: 12/01/1950, 61 y.o.   MRN: [id number]  HPIHere for annual exam Patient Active Problem List  Diagnoses  . HYPERLIPIDEMIA, MIXED  . OBESITY-He has gained 15 or 20 pounds  . HYPERTENSION-His blood pressure is elevated today and it was elevated when he was here for bronchitis 2 months ago/he has not done home blood pressures  . AORTIC STENOSIS-History of care has been very successful and he has no cardiac symptoms-Stable with exercise that involves walking his dogs but not much more at this point  . HERNIATED LUMBAR DISC-Stable  . SLEEP APNEA-Stable with the regular use of CPAP/no daytime hypersomnolence  . ALLERGY  . 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 He has no palpitations or chest pain. There is no peripheral edema     Objective:   Physical Exam  Constitutional: He is oriented to person, place, and time.       He is obese   HENT:  Head: Normocephalic.  Right Ear: External ear normal.  Left Ear: External ear normal.       Canals are clear and tympanic membranes intact  Eyes: Conjunctivae and EOM are normal. Pupils are equal, round, and reactive to light.  Neck: Normal range of motion. Neck supple. No thyromegaly present.    Cardiovascular: Normal rate, regular rhythm, normal heart sounds and intact distal pulses.   No murmur heard.      S2 is loud and blunt and not separated  Pulmonary/Chest: Effort normal and breath sounds normal.  Abdominal: Soft. Bowel sounds are normal. He exhibits no mass. There is no tenderness.       No abdominal bruit  Genitourinary:       Prostate was normal one year ago with PSA below 1  Musculoskeletal: Normal range of motion. He exhibits no edema and no tenderness.  Lymphadenopathy:    He has no cervical adenopathy.  Neurological: He is alert and oriented to person, place, and time. He has normal reflexes. No cranial nerve deficit.  Psychiatric: He has a normal mood and affect. His behavior is normal. Judgment and thought content normal.          Assessment & Plan:   1. Routine general medical examination at a health care facility  POCT urinalysis dipstick, CBC with Differential, Comprehensive metabolic panel, Lipid panel, Testosterone, free, total, PSA  2. HTN (hypertension)  amlodipine-atorvastatin (CADUET) 10-20 MG per tablet, metoprolol succinate (TOPROL XL) 50 MG 24 hr tablet  3. BMI 45.0-49.9, adult    4. HYPERLIPIDEMIA, MIXED    His other problems are outlined on his problem list We will recheck his hypogonadism He is advised to use CPAP Routine labs were done and medications were reordered/Caduet  was increased so the amlodipine component with 10 mg/he was advised to watch for peripheral edema We'll set up a referral to his wife's cardiologist His main issue will be to decrease his weight/rediscussed using the new hiking trails and showers available at work where there is also A gym

## 2011-11-04 ENCOUNTER — Telehealth: Payer: Self-pay | Admitting: Family Medicine

## 2011-11-04 LAB — TESTOSTERONE, FREE, TOTAL, SHBG
Sex Hormone Binding: 25 nmol/L (ref 13–71)
Testosterone, Free: 47.8 pg/mL (ref 47.0–244.0)
Testosterone-% Free: 2.2 % (ref 1.6–2.9)

## 2011-11-04 NOTE — Telephone Encounter (Signed)
Spoke with Dr. Aurora Mask office, and patient is overdue for colonoscopy.  It was due in March.  To MD, FYI.

## 2011-11-04 NOTE — Telephone Encounter (Signed)
Message copied by Jacqualyn Posey on Thu Nov 04, 2011 10:30 AM ------      Message from: Tonye Pearson      Created: Thu Nov 04, 2011  7:34 AM       Please call Dr. Ritta Slot to see when his next colonoscopy is due

## 2011-11-04 NOTE — Telephone Encounter (Signed)
pts wife called stating for his cardio referral they prefer to see dr Garnette Scheuermann @ [facility]  Best: [phone]

## 2011-11-05 ENCOUNTER — Encounter: Payer: Self-pay | Admitting: Internal Medicine

## 2012-03-15 ENCOUNTER — Ambulatory Visit (INDEPENDENT_AMBULATORY_CARE_PROVIDER_SITE_OTHER): Payer: BC Managed Care – PPO | Admitting: Internal Medicine

## 2012-03-15 ENCOUNTER — Ambulatory Visit: Payer: BC Managed Care – PPO

## 2012-03-15 VITALS — BP 140/88 | HR 70 | Temp 97.1°F | Resp 18 | Ht 68.5 in | Wt 326.0 lb

## 2012-03-15 DIAGNOSIS — R062 Wheezing: Secondary | ICD-10-CM

## 2012-03-15 DIAGNOSIS — R05 Cough: Secondary | ICD-10-CM

## 2012-03-15 DIAGNOSIS — S2239XA Fracture of one rib, unspecified side, initial encounter for closed fracture: Secondary | ICD-10-CM

## 2012-03-15 DIAGNOSIS — R059 Cough, unspecified: Secondary | ICD-10-CM

## 2012-03-15 DIAGNOSIS — R079 Chest pain, unspecified: Secondary | ICD-10-CM

## 2012-03-15 DIAGNOSIS — R042 Hemoptysis: Secondary | ICD-10-CM

## 2012-03-15 DIAGNOSIS — J209 Acute bronchitis, unspecified: Secondary | ICD-10-CM

## 2012-03-15 DIAGNOSIS — R0602 Shortness of breath: Secondary | ICD-10-CM

## 2012-03-15 LAB — POCT CBC
Granulocyte percent: 72 %G (ref 37–80)
MCH, POC: 28.1 pg (ref 27–31.2)
MID (cbc): 0.5 (ref 0–0.9)
MPV: 8 fL (ref 0–99.8)
POC LYMPH PERCENT: 21.5 %L (ref 10–50)
POC MID %: 6.5 %M (ref 0–12)
Platelet Count, POC: 345 10*3/uL (ref 142–424)
RBC: 5.08 M/uL (ref 4.69–6.13)
RDW, POC: 15.3 %

## 2012-03-15 MED ORDER — PREDNISONE 50 MG PO TABS: ORAL_TABLET | ORAL | Status: DC

## 2012-03-15 MED ORDER — HYDROCODONE-ACETAMINOPHEN 5-325 MG PO TABS: 1.0000 | ORAL_TABLET | Freq: Four times a day (QID) | ORAL | Status: DC | PRN

## 2012-03-15 MED ORDER — AZITHROMYCIN 500 MG PO TABS: 500.0000 mg | ORAL_TABLET | Freq: Every day | ORAL | Status: DC

## 2012-03-15 MED ORDER — ALBUTEROL SULFATE (2.5 MG/3ML) 0.083% IN NEBU
2.5000 mg | INHALATION_SOLUTION | Freq: Once | RESPIRATORY_TRACT | Status: AC
  Administered 2012-03-15: 2.5 mg via RESPIRATORY_TRACT

## 2012-03-15 MED ORDER — IPRATROPIUM BROMIDE 0.02 % IN SOLN
0.5000 mg | Freq: Once | RESPIRATORY_TRACT | Status: AC
  Administered 2012-03-15: 0.5 mg via RESPIRATORY_TRACT

## 2012-03-15 NOTE — Progress Notes (Signed)
  Subjective:    Patient ID: [name] [name], male    DOB: [birth year], 61 y.o.   MRN: [id number]  HPI Cough Shortness of breath Onset 4 days ago No fever Chills/aches all over body Yellow sputum  short of breath Sharp right sided chest pain after heavy coughing last night Pain 5/10 increase with deep breath and movement    Review of Systems  Constitutional: Positive for chills, appetite change and fatigue.  HENT: Negative.   Eyes: Negative.   Respiratory: Positive for cough, shortness of breath and wheezing.   Cardiovascular: Negative.   Gastrointestinal: Negative.   Genitourinary: Negative.   Skin: Negative.   Neurological: Negative.   Hematological: Negative.   Psychiatric/Behavioral: Negative.   All other systems reviewed and are negative.       Objective:   Physical Exam  Nursing note and vitals reviewed. Constitutional: He is oriented to person, place, and time. He appears well-developed and well-nourished.  HENT:  Head: Normocephalic and atraumatic.  Right Ear: External ear normal.  Left Ear: External ear normal.  Nose: Nose normal.  Mouth/Throat: Oropharynx is clear and moist.  Eyes: Conjunctivae normal and EOM are normal. Pupils are equal, round, and reactive to light.  Neck: Normal range of motion. Neck supple.  Pulmonary/Chest: No respiratory distress. He has wheezes. He has no rales. He exhibits tenderness.       Rib tenderness right 8th rib  Wheezing diffusely throughout lung fields  Abdominal: Soft. Bowel sounds are normal.  Musculoskeletal: Normal range of motion.  Neurological: He is alert and oriented to person, place, and time. He has normal reflexes.  Skin: Skin is warm and dry.  Psychiatric: He has a normal mood and affect. His behavior is normal. Thought content normal.  [facility] reading (PRIMARY) by  Dr. Mindi Junker. Increased narkings c/c acute bronchitus. Enlarged heart sp aortic valve replacement  Results for orders placed in visit on 03/15/12    POCT CBC      Component Value Range   WBC 8.3  4.6 - 10.2 K/uL   Lymph, poc 1.8  0.6 - 3.4   POC LYMPH PERCENT 21.5  10 - 50 %L   MID (cbc) 0.5  0 - 0.9   POC MID % 6.5  0 - 12 %M   POC Granulocyte 6.0  2 - 6.9   Granulocyte percent 72.0  37 - 80 %G   RBC 5.08  4.69 - 6.13 M/uL   Hemoglobin 14.3  14.1 - 18.1 g/dL   HCT, POC 78.2  95.6 - 53.7 %   MCV 91.2  80 - 97 fL   MCH, POC 28.1  27 - 31.2 pg   MCHC 30.9 (*) 31.8 - 35.4 g/dL   RDW, POC 21.3     Platelet Count, POC 345  142 - 424 K/uL   MPV 8.0  0 - 99.8 fL       Assessment & Plan:  wheezing  o2 sat 93 percent Will assess chest xray and give a brathing rx in the er Plan antibiotics and steroids. Recheck increased air enty after nebulized bronchodialtor in office

## 2012-03-15 NOTE — Patient Instructions (Addendum)
zithroamax one daily. oprednisone one daily in am with food. If your symptoms continue retur n to the office.vicodin 1 tab every 4 hours or cough or pain.

## 2012-03-29 ENCOUNTER — Ambulatory Visit: Payer: BC Managed Care – PPO | Admitting: Internal Medicine

## 2012-04-05 ENCOUNTER — Ambulatory Visit (INDEPENDENT_AMBULATORY_CARE_PROVIDER_SITE_OTHER): Payer: BC Managed Care – PPO | Admitting: Internal Medicine

## 2012-04-05 ENCOUNTER — Encounter: Payer: Self-pay | Admitting: Internal Medicine

## 2012-04-05 VITALS — BP 136/88 | HR 71 | Temp 98.5°F | Resp 18 | Ht 68.5 in | Wt 325.0 lb

## 2012-04-05 DIAGNOSIS — G4700 Insomnia, unspecified: Secondary | ICD-10-CM

## 2012-04-05 DIAGNOSIS — Z23 Encounter for immunization: Secondary | ICD-10-CM

## 2012-04-05 MED ORDER — ZOLPIDEM TARTRATE ER 12.5 MG PO TBCR: 12.5000 mg | EXTENDED_RELEASE_TABLET | Freq: Every evening | ORAL | Status: DC | PRN

## 2012-04-05 NOTE — Progress Notes (Signed)
  Subjective:    Patient ID: [name] [name], male    DOB: 07/08/1950, 61 y.o.   MRN: [id number]  CC: 61 yo W M here for f/u and c/o insomnia, depression, bump on back  HPI Pt c/o insomnia.  He wakes up q 2 hrs.  He had turned down sleep meds in the past, but is interested to try them today.  He denies gasping for air in his sleep, falls asleep all right, but wakes up multiple x/night worrying about to do lists.   Mrs. Rennis Chris in over 46 weight  Pt c/o feeling depressed.  "It's been a rough year," and he does not know if his situations at home/work will change.  He recognizes his behavior changes based on how well he sleep.  We discussed trying to tx his sleeping problems first, with the consideration of adjunct CBT before trying an antidepressant. Corporate tax attorney  Pt c/o bump on back.  It is not new or bothering him.  He is just curious what it is.  PMHx: Patient Active Problem List  Diagnosis  . HYPERLIPIDEMIA, MIXED  . OBESITY  . HYPERTENSION  . AORTIC STENOSIS  . HERNIATED LUMBAR DISC  . SLEEP APNEA  . ALLERGY  . BMI 45.0-49.9, adult     Review of Systems Nothing new    Objective:   Physical Exam General: Vitals: 325# Filed Vitals:   04/05/12 1436  BP: 136/88  Pulse: 71  Temp: 98.5 F (36.9 C)  Resp: 18  Neuro: Alert, oriented, CN II - XII Psych: Mood and affect appropriate Skin: 2x2cm sized lipoma on back, no signs of infection/soft        Assessment & Plan:  1) Insomnia - [id number] @ hs, discussed SE's including sleepwalking & groggy  -Consider Trazodone if Ambien does not help.   2) Depression - Beck Scale = 16 so no clinical Derpr  See if resolves upon addressing sleep, consider CBT then antidepressants if not improving 3) Lipoma   Call with results of sleep medication and I'll adjust this by phone if necessary

## 2012-07-29 ENCOUNTER — Other Ambulatory Visit: Payer: Self-pay | Admitting: Internal Medicine

## 2012-08-01 ENCOUNTER — Other Ambulatory Visit: Payer: Self-pay | Admitting: Internal Medicine

## 2012-10-14 ENCOUNTER — Ambulatory Visit (INDEPENDENT_AMBULATORY_CARE_PROVIDER_SITE_OTHER): Payer: BC Managed Care – PPO | Admitting: Internal Medicine

## 2012-10-14 VITALS — BP 156/94 | HR 58 | Temp 98.4°F | Resp 16 | Ht 71.0 in | Wt 325.0 lb

## 2012-10-14 DIAGNOSIS — E782 Mixed hyperlipidemia: Secondary | ICD-10-CM

## 2012-10-14 DIAGNOSIS — I10 Essential (primary) hypertension: Secondary | ICD-10-CM

## 2012-10-14 DIAGNOSIS — G4730 Sleep apnea, unspecified: Secondary | ICD-10-CM

## 2012-10-14 DIAGNOSIS — E785 Hyperlipidemia, unspecified: Secondary | ICD-10-CM

## 2012-10-14 MED ORDER — AMLODIPINE-ATORVASTATIN 10-20 MG PO TABS: 1.0000 | ORAL_TABLET | Freq: Every day | ORAL | Status: DC

## 2012-10-14 MED ORDER — METOPROLOL SUCCINATE ER 50 MG PO TB24: 50.0000 mg | ORAL_TABLET | Freq: Every day | ORAL | Status: DC

## 2012-10-14 NOTE — Progress Notes (Signed)
  Subjective:    Patient ID: [name] [name], male    DOB: 1951/02/11, 62 y.o.   MRN: [id number]  HPIout of caduet--pharm wouldn,t process his request(mail order) Doing well otherwise See rec exam  Has adjusted now to CPAP and is doing extremely well with sleep   Review of Systems Negative    Objective:   Physical Exam BP 156/94  Pulse 58  Temp(Src) 98.4 F (36.9 C) (Oral)  Resp 16  Ht 5\' 11"  (1.803 m)  Wt 325 lb (147.419 kg)  BMI 45.35 kg/m2  SpO2 98%        Assessment & Plan:  HTN (hypertension) - Plan: amlodipine-atorvastatin (CADUET) 10-20 MG per tablet, metoprolol succinate (TOPROL XL) 50 MG 24 hr tablet  HYPERLIPIDEMIA, MIXED  SLEEP APNEA  Obesity   To continue to work on weight reduction Blood pressure not currently controlled/home blood pressures once he restarts his medication and followup as elevated Can have labs in one year

## 2013-03-17 ENCOUNTER — Ambulatory Visit (INDEPENDENT_AMBULATORY_CARE_PROVIDER_SITE_OTHER): Payer: BC Managed Care – PPO | Admitting: Physician Assistant

## 2013-03-17 VITALS — BP 128/78 | HR 86 | Temp 98.0°F | Resp 16 | Ht 71.0 in | Wt 311.0 lb

## 2013-03-17 DIAGNOSIS — Z23 Encounter for immunization: Secondary | ICD-10-CM

## 2013-03-17 NOTE — Progress Notes (Signed)
  Subjective:    Patient ID: Laverda Page, male    DOB: September 18, 1950, 62 y.o.   MRN: [id number]  HPI 62 year old male presents for Tdap immunization.  Is expecting a new grandchild and was told he needed this.  Had flu shot 2 days ago.   Denies cough, fever, chills, SOB, or wheezing.  Patient is doing well with no other concerns or complaints.     Review of Systems  Constitutional: Negative for fever and chills.  Respiratory: Negative for cough and shortness of breath.   Neurological: Negative for headaches.       Objective:   Physical Exam  Constitutional: He is oriented to person, place, and time. He appears well-developed and well-nourished.  HENT:  Head: Normocephalic and atraumatic.  Right Ear: External ear normal.  Left Ear: External ear normal.  Eyes: Conjunctivae are normal.  Neck: Normal range of motion.  Cardiovascular: Normal rate.   Pulmonary/Chest: Effort normal.  Neurological: He is alert and oriented to person, place, and time.  Psychiatric: He has a normal mood and affect. His behavior is normal. Judgment and thought content normal.          Assessment & Plan:  Need for Tdap vaccination - Plan: Tdap vaccine greater than or equal to 7yo IM  Tdap updated Follow up as needed.

## 2013-06-03 ENCOUNTER — Telehealth: Payer: Self-pay

## 2013-06-03 NOTE — Telephone Encounter (Signed)
PATIENT HAD SPOKE WITH DR DOOLITTLE ABOUT SOME ANTIDEPRESSANT MEDICATION WOULD NOW LIKE TO HAVE THAT CALLED IN IF POSSIBLE PLEASE CALL PATIENT AT [phone]

## 2013-06-04 MED ORDER — FLUOXETINE HCL 20 MG PO TABS: 20.0000 mg | ORAL_TABLET | Freq: Every day | ORAL | Status: DC

## 2013-06-04 NOTE — Telephone Encounter (Signed)
Prozac sent to [facility]--- he should touch base with me about the results in 4 to 6 weeks

## 2013-06-04 NOTE — Telephone Encounter (Signed)
Thanks called him to advise. Left message.

## 2013-08-01 ENCOUNTER — Ambulatory Visit (INDEPENDENT_AMBULATORY_CARE_PROVIDER_SITE_OTHER): Payer: BC Managed Care – PPO | Admitting: Internal Medicine

## 2013-08-01 ENCOUNTER — Encounter: Payer: Self-pay | Admitting: Internal Medicine

## 2013-08-01 ENCOUNTER — Other Ambulatory Visit: Payer: Self-pay | Admitting: Internal Medicine

## 2013-08-01 VITALS — BP 130/79 | HR 60 | Temp 97.8°F | Resp 16 | Ht 68.0 in | Wt 303.0 lb

## 2013-08-01 DIAGNOSIS — I10 Essential (primary) hypertension: Secondary | ICD-10-CM

## 2013-08-01 DIAGNOSIS — Z125 Encounter for screening for malignant neoplasm of prostate: Secondary | ICD-10-CM

## 2013-08-01 DIAGNOSIS — F3289 Other specified depressive episodes: Secondary | ICD-10-CM

## 2013-08-01 DIAGNOSIS — E291 Testicular hypofunction: Secondary | ICD-10-CM

## 2013-08-01 DIAGNOSIS — E782 Mixed hyperlipidemia: Secondary | ICD-10-CM

## 2013-08-01 DIAGNOSIS — G4730 Sleep apnea, unspecified: Secondary | ICD-10-CM

## 2013-08-01 DIAGNOSIS — Z6841 Body Mass Index (BMI) 40.0 and over, adult: Secondary | ICD-10-CM

## 2013-08-01 DIAGNOSIS — E785 Hyperlipidemia, unspecified: Secondary | ICD-10-CM

## 2013-08-01 DIAGNOSIS — F329 Major depressive disorder, single episode, unspecified: Secondary | ICD-10-CM

## 2013-08-01 LAB — COMPLETE METABOLIC PANEL WITH GFR
ALBUMIN: 4.6 g/dL (ref 3.5–5.2)
ALK PHOS: 104 U/L (ref 39–117)
ALT: 28 U/L (ref 0–53)
AST: 22 U/L (ref 0–37)
BUN: 18 mg/dL (ref 6–23)
CO2: 24 mEq/L (ref 19–32)
Calcium: 9.6 mg/dL (ref 8.4–10.5)
Chloride: 104 mEq/L (ref 96–112)
Creat: 1.16 mg/dL (ref 0.50–1.35)
GFR, EST NON AFRICAN AMERICAN: 67 mL/min
GFR, Est African American: 77 mL/min
Glucose, Bld: 80 mg/dL (ref 70–99)
POTASSIUM: 4.2 meq/L (ref 3.5–5.3)
Sodium: 138 mEq/L (ref 135–145)
Total Bilirubin: 0.5 mg/dL (ref 0.2–1.2)
Total Protein: 6.9 g/dL (ref 6.0–8.3)

## 2013-08-01 LAB — CBC WITH DIFFERENTIAL/PLATELET
BASOS PCT: 0 % (ref 0–1)
Basophils Absolute: 0 10*3/uL (ref 0.0–0.1)
EOS ABS: 0.4 10*3/uL (ref 0.0–0.7)
Eosinophils Relative: 5 % (ref 0–5)
HEMATOCRIT: 43.6 % (ref 39.0–52.0)
HEMOGLOBIN: 15 g/dL (ref 13.0–17.0)
Lymphocytes Relative: 25 % (ref 12–46)
Lymphs Abs: 2.1 10*3/uL (ref 0.7–4.0)
MCH: 29.5 pg (ref 26.0–34.0)
MCHC: 34.4 g/dL (ref 30.0–36.0)
MCV: 85.8 fL (ref 78.0–100.0)
MONO ABS: 0.8 10*3/uL (ref 0.1–1.0)
MONOS PCT: 9 % (ref 3–12)
NEUTROS ABS: 5.1 10*3/uL (ref 1.7–7.7)
Neutrophils Relative %: 61 % (ref 43–77)
Platelets: 316 10*3/uL (ref 150–400)
RBC: 5.08 MIL/uL (ref 4.22–5.81)
RDW: 14.7 % (ref 11.5–15.5)
WBC: 8.4 10*3/uL (ref 4.0–10.5)

## 2013-08-01 LAB — LIPID PANEL
CHOL/HDL RATIO: 3.1 ratio
CHOLESTEROL: 140 mg/dL (ref 0–200)
HDL: 45 mg/dL (ref >39)
LDL Cholesterol: 67 mg/dL (ref 0–99)
Triglycerides: 142 mg/dL (ref <150)
VLDL: 28 mg/dL (ref 0–40)

## 2013-08-01 LAB — HEMOGLOBIN A1C
Hgb A1c MFr Bld: 6.3 % — ABNORMAL HIGH (ref <5.7)
Mean Plasma Glucose: 134 mg/dL — ABNORMAL HIGH (ref <117)

## 2013-08-01 MED ORDER — AMLODIPINE-ATORVASTATIN 10-20 MG PO TABS: 1.0000 | ORAL_TABLET | Freq: Every day | ORAL | Status: AC

## 2013-08-01 MED ORDER — METOPROLOL SUCCINATE ER 50 MG PO TB24: 50.0000 mg | ORAL_TABLET | Freq: Every day | ORAL | Status: DC

## 2013-08-01 MED ORDER — FLUOXETINE HCL 20 MG PO TABS: 20.0000 mg | ORAL_TABLET | Freq: Every day | ORAL | Status: DC

## 2013-08-01 NOTE — Progress Notes (Addendum)
Subjective:    Patient ID: [name] [name], male    DOB: [birth year], 63 y.o.   MRN: [id number] This chart was scribed for Tami Lin, MD by Anastasia Pall, ED Scribe. This patient was seen in room 27 and the patient's care was started at 4:10 PM.  Chief Complaint  Patient presents with  . Medication Refill    all meds   HPI [name] [name] is a 63 y.o. male Pt presents for follow up and medication refill for all his medications.   He recently transferred from [employer] to [employer] [facility] and none of his Rxs crossed over, so he is needing refills. Is wondering about other options for testosterone.   He states his Prozac is working well for him.   Wt Readings from Last 3 Encounters:  08/01/13 303 lb (137.44 kg)  03/17/13 311 lb (141.069 kg)  10/14/12 325 lb (147.419 kg)   He states he walks 0.5 miles to and from work every day. He has h/o meniscus tear, knee pain. He states someone recommended supplement w/ willow and tumeric which he has been taking with significant relief. He denies LE swelling, and any other associated symptoms.  PCP - rpd  Patient Active Problem List   Diagnosis Date Noted  . BMI 45.0-49.9, adult----exer and diet impr w/ 20lb loss 11/03/2011  . HYPERLIPIDEMIA, MIXED--no side eff 04/06/2007    04/06/2007  . HYPERTENSION--stable 04/06/2007  . AORTIC STENOSIS s/p repair 04/06/2007  . HERNIATED LUMBAR DISC--stable 04/06/2007  . SLEEP APNEA---on CPAP w/ good results 04/06/2007  . ALLERGY 04/06/2007   Past Surgical History  Procedure Laterality Date  . Vasectomy    . Cardiac valve replacement      Prior to Admission medications   Medication Sig Start Date End Date Taking? Authorizing Provider  amlodipine-atorvastatin (CADUET) 10-20 MG per tablet Take 1 tablet by mouth daily. 10/14/12 10/14/13  Leandrew Koyanagi, MD  aspirin 81 MG tablet Take 81 mg by mouth daily.    Historical Provider, MD  FLUoxetine (PROZAC) 20 MG tablet Take 1 tablet  (20 mg total) by mouth daily. 06/04/13   Leandrew Koyanagi, MD  metoprolol succinate (TOPROL XL) 50 MG 24 hr tablet Take 1 tablet (50 mg total) by mouth daily. 10/14/12   Leandrew Koyanagi, MD  Multiple Vitamin (MULTIVITAMIN) tablet Take 1 tablet by mouth daily.    Historical Provider, MD   Review of Systems  Constitutional: Positive for appetite change. Negative for activity change, fatigue and unexpected weight change.       Note good wt loss  Eyes: Negative for visual disturbance.  Respiratory: Negative for cough, chest tightness and shortness of breath.   Cardiovascular: Negative for chest pain, palpitations and leg swelling.  Genitourinary: Negative for difficulty urinating.       Had little success with short trial of testos last yr--too messy-wife concerned re exposure//would consider other forms  Musculoskeletal: Negative for back pain and gait problem.  Neurological: Negative for headaches.  Psychiatric/Behavioral:       See phone in 05/2013 for depr sxt--now much impr on P  All other systems reviewed and are negative.   BP 130/79  Pulse 60  Temp(Src) 97.8 F (36.6 C) (Oral)  Resp 16  Ht 5\' 8"  (1.727 m)  Wt 303 lb (137.44 kg)  BMI 46.08 kg/m2  SpO2 95%     Objective:   Physical Exam  Nursing note and vitals reviewed. Constitutional: He is oriented to person, place, and  time. He appears well-developed and well-nourished. No distress.  HENT:  Head: Normocephalic and atraumatic.  Eyes: Conjunctivae and EOM are normal. Pupils are equal, round, and reactive to light.  Neck: Normal range of motion. Neck supple. No thyromegaly present.  Cardiovascular: Normal rate, regular rhythm, normal heart sounds and intact distal pulses.   No murmur heard. Pulmonary/Chest: Effort normal and breath sounds normal. No respiratory distress. He has no wheezes. He has no rales.  Musculoskeletal: Normal range of motion.  Lymphadenopathy:    He has no cervical adenopathy.  Neurological: He is  alert and oriented to person, place, and time.  Skin: Skin is warm and dry.  Psychiatric: He has a normal mood and affect. His behavior is normal. Judgment and thought content normal.      Assessment & Plan:  BMI 45.0-49.9, adult - Plan: Hemoglobin A1c  HYPERLIPIDEMIA, MIXED - Plan: Lipid panel  HYPERTENSION - Plan: CBC with Differential, COMPLETE METABOLIC PANEL WITH GFR  SLEEP APNEA - Plan: CBC with Differential, COMPLETE METABOLIC PANEL WITH GFR  Depressive disorder, not elsewhere classified - Plan: COMPLETE METABOLIC PANEL WITH GFR, TSH  Screening for prostate cancer - Plan: PSA  Hypogonadism male - Plan: Testosterone, free, total  Meds ordered this encounter  Medications  . Misc Natural Products (JOINT SUPPORT PO)    Sig: Take by mouth.  . metoprolol succinate (TOPROL XL) 50 MG 24 hr tablet    Sig: Take 1 tablet (50 mg total) by mouth daily.    Dispense:  90 tablet    Refill:  3  . FLUoxetine (PROZAC) 20 MG tablet    Sig: Take 1 tablet (20 mg total) by mouth daily.    Dispense:  90 tablet    Refill:  3  . amlodipine-atorvastatin (CADUET) 10-20 MG per tablet    Sig: Take 1 tablet by mouth daily.    Dispense:  90 tablet    Refill:  3        I have completed the patient encounter in its entirety as documented by the scribe, with editing by me where necessary. Pete Merten P. Laney Pastor, M.D.  Addendum 08/03/2013 Testosterone low-we'll add treatment androgel 20.25 at 50.5 daily-reck 35mos  Apply (preferably in the morning) to the shoulder and upper arms. AndroGel 1.62% to the abdomen, genitals, chest, axillae, or knees. Area of application limited to what will be covered by a short sleeve t-shirt. Apply at the same time each day. Upon opening the packet(s), the entire contents should be squeezed into the palm of the hand and immediately applied to the application site(s). Alternatively, a portion may be squeezed onto palm of hand and applied, repeating the process at the same  or other site until entire packet has been applied. Application site(s) should not be washed for 2 hours following application of AndroGel 1.62%.

## 2013-08-02 ENCOUNTER — Other Ambulatory Visit: Payer: Self-pay | Admitting: Radiology

## 2013-08-02 LAB — TESTOSTERONE, FREE, TOTAL, SHBG
SEX HORMONE BINDING: 33 nmol/L (ref 13–71)
TESTOSTERONE FREE: 34.6 pg/mL — AB (ref 47.0–244.0)
TESTOSTERONE-% FREE: 1.9 % (ref 1.6–2.9)
Testosterone: 183 ng/dL — ABNORMAL LOW (ref 300–890)

## 2013-08-02 LAB — PSA: PSA: 1.21 ng/mL (ref <=4.00)

## 2013-08-02 LAB — TSH: TSH: 0.834 u[IU]/mL (ref 0.350–4.500)

## 2013-08-03 ENCOUNTER — Encounter: Payer: Self-pay | Admitting: Internal Medicine

## 2013-08-03 MED ORDER — TESTOSTERONE 20.25 MG/1.25GM (1.62%) TD GEL: 50.5000 mg | TRANSDERMAL | Status: DC

## 2013-08-03 NOTE — Addendum Note (Signed)
Addended by: Leandrew Koyanagi on: 08/03/2013 03:43 PM   Modules accepted: Orders

## 2013-08-06 LAB — FSH/LH
FSH: 8.5 m[IU]/mL (ref 1.4–18.1)
LH: 3 m[IU]/mL (ref 1.5–9.3)

## 2013-08-06 LAB — PROLACTIN: Prolactin: 9.8 ng/mL (ref 2.1–17.1)

## 2013-10-23 ENCOUNTER — Telehealth: Payer: Self-pay

## 2013-10-23 DIAGNOSIS — R1900 Intra-abdominal and pelvic swelling, mass and lump, unspecified site: Secondary | ICD-10-CM

## 2013-10-23 NOTE — Telephone Encounter (Signed)
[place] NEED TO BE REFERRED TO A SURGEON THAT CAN REMOVE A PLACE ON HIS RIGHT SIDE PLEASE CALL [phone]

## 2013-10-23 NOTE — Telephone Encounter (Signed)
Spoke to patient's wife and she states that patient has a large lump on his side that Dr. Laney Pastor said needs to be biopsied.  He needs a referral to have this done.

## 2013-10-24 NOTE — Telephone Encounter (Signed)
Refer to [facility] [place] [facility] 1st avail for eval mass, trunk

## 2013-11-07 ENCOUNTER — Ambulatory Visit (INDEPENDENT_AMBULATORY_CARE_PROVIDER_SITE_OTHER): Payer: BC Managed Care – PPO | Admitting: General Surgery

## 2013-11-19 ENCOUNTER — Encounter (INDEPENDENT_AMBULATORY_CARE_PROVIDER_SITE_OTHER): Payer: Self-pay

## 2013-11-19 ENCOUNTER — Encounter: Payer: Self-pay | Admitting: Neurology

## 2013-11-19 ENCOUNTER — Ambulatory Visit (INDEPENDENT_AMBULATORY_CARE_PROVIDER_SITE_OTHER): Payer: BC Managed Care – PPO | Admitting: Neurology

## 2013-11-19 VITALS — BP 134/84 | HR 56 | Resp 16 | Ht 68.0 in | Wt 307.0 lb

## 2013-11-19 DIAGNOSIS — G4733 Obstructive sleep apnea (adult) (pediatric): Secondary | ICD-10-CM

## 2013-11-19 DIAGNOSIS — Z9989 Dependence on other enabling machines and devices: Secondary | ICD-10-CM

## 2013-11-19 NOTE — Patient Instructions (Signed)
CPAP and BIPAP Information CPAP and BIPAP are methods of helping you breathe with the use of air pressure. CPAP stands for "continuous positive airway pressure." BIPAP stands for "bi-level positive airway pressure." In both methods, air is blown into your air passages to help keep you breathing well. With CPAP, the amount of pressure stays the same while you breathe in and out. CPAP is most commonly used for obstructive sleep apnea. For obstructive sleep apnea, CPAP works by holding your airways open so that they do not collapse when your muscles relax during sleep. BIPAP is similar to CPAP except the amount of pressure is increased when you inhale. This helps you take larger breaths. Your health care [name] [name] will recommend whether CPAP or BIPAP would be more helpful for you.  WHY ARE CPAP AND BIPAP TREATMENTS USED? CPAP or BIPAP can be helpful if you have:   Sleep apnea.   Chronic obstructive pulmonary disease (COPD).   Diseases that weaken the muscles of the chest, including muscular dystrophy or neurological diseases such as amyotrophic lateral sclerosis (ALS).   Other problems that cause breathing to be weak, abnormal, or difficult.  HOW IS CPAP OR BIPAP ADMINISTERED? Both CPAP and BIPAP are provided by a small machine with a flexible plastic tube that attaches to a plastic mask. The mask fits on your face, and air is blown into your air passages through your nose or mouth. The amount of pressure that is used to blow the air into your air passages can be set on the machine. Your health care [name] [name] will determine the pressure setting that should be used based on your individual needs.  WHEN SHOULD CPAP OR BIPAP BE USED? In most cases, the mask is worn only when sleeping. Generally, you will need to wear the mask throughout the night and during the daytime if you take a nap. In a few cases involving certain medical conditions, people also need to wear the mask at other times when they are  awake. Follow your health care [name] [name]'s instructions for when to use the machine.  USING THE MASK  Because the mask needs to be snug, some people feel a trapped or closed-in feeling (claustrophobic) when first using the mask. You may need to get used to the mask gradually. To do this, you can first hold the mask loosely over your nose or mouth. Gradually apply the mask more snugly. You can also gradually increase the amount of time that you use the mask.   Masks are available in various types and sizes. Some fit over your mouth and nose, and some fit over just your nose. If your mask does not fit well, talk to your health care [name] [name] about getting a different one.  If you are using a nasal mask and you tend to breathe through your mouth, a chin strap may be applied to help keep your mouth closed.   The CPAP and BIPAP machines have alarms that may sound if the mask comes off or develops a leak.   If you have trouble with the mask, it is very important that you talk to your health care [name] [name] about finding a way to make the mask easier to tolerate. Do not stop using the mask. This could have a negative impact on your health. TIPS FOR USING THE MACHINE  Place your CPAP or BIPAP machine on a secure table or stand near an electrical outlet.   Know where the on-off switch is located on the machine.     Follow your health care [name] [name]'s instructions for how to set the pressure on your machine and when you should use it.   Do not eat or drink while the CPAP or BIPAP machine is on. Food or fluids could get pushed into your lungs by the pressure of the CPAP or BIPAP.  Do not smoke. Tobacco smoke residue can damage the machine.   For home use, CPAP and BIPAP machines can be rented or purchased through home health care companies. Many different brands of machines are available. Renting a machine before purchasing may help you find out which particular machine works well for you. SEEK  IMMEDIATE MEDICAL CARE IF:  You have redness or open areas around your nose or mouth where the mask fits.   You have trouble operating the CPAP or BIPAP machine.   You cannot tolerate wearing the CPAP or BIPAP mask.  Document Released: 02/13/2004 Document Revised: 01/17/2013 Document Reviewed: 12/14/2012 [facility] Patient Information 2015 [place], [place]. This information is not intended to replace advice given to you by your health care [name] [name]. Make sure you discuss any questions you have with your health care [name] [name].

## 2013-11-19 NOTE — Progress Notes (Addendum)
[facility]  Cathren Sween:  Larey Seat, M D  Referring Erial Fikes: Leandrew Koyanagi, MD Primary Care Physician:  Leandrew Koyanagi, MD    HPI:  [name] [name] is a 63 y.o. caucasian, right handed, maried male,  Father of 2 , working as a tax Nurse, mental health , who is seen here as a referral from Dr. Laney Pastor for a compliance CPAP evaluation.   The patient is here after 6 years for a re-evaoluation, and because he needs CPAP supplies. His swift FX is leaking air and his headgear is worn, he needs these replaced.  His download from 11-18-13 , showed excellent compliance at hours and 56 minutes, AHI of 5 , rising over the last 8 weeks with the air leak , born of the worn straps.  Epworth  7 , FSS 34 , GDS 1.   The patient reports that before CPAP use, he was excessively sleepy and has never made it through a theater performance or cinema movie alert. He was loudly snoring.  I reviewed the labs from  Dr. Laney Pastor 's office, 08-06-13. He has no nocturia on CPAP, his wife sleeps better, he sleeps 8 to 9 hours en toto. The last 30 days, with higher air leak and higher residual AHI, he has nocturia again, and didn't have it before. He has a history of aortic valve replacement.  Medication reviewed again.    Sleep study. The patient underwent a sleep study at [facility] in the year 2007,  he then underwent  on  March  9th 2012 a  split-night protocol [facility].  His baseline AHI of 84.7 warranted immediate intervention and the study was " SPLIT " , Oxygen nadir was 79% with 39.5 minutes in desaturation.  CPAP was titrated from 6 to 8 cm water. The patient is still on the same setting.  Review of Systems: Out of a complete 14 system review, the patient complains of only the following symptoms, and all other reviewed systems are negative. See above Epworth, FSS.   History   Social History  . Marital Status: Married    Spouse Name: Dori    Number of  Children: 2  . Years of Education: College   Occupational History  . Not on file.   Social History Main Topics  . Smoking status: Former Smoker    Quit date: 10/04/2003  . Smokeless tobacco: Not on file  . Alcohol Use: Yes     Comment: 1-2 glass per month  . Drug Use: No  . Sexual Activity: Not on file   Other Topics Concern  . Not on file   Social History Narrative   Patient is married Engineer, drilling) and lives at home with his wife.   Patient has two children.   Patient works at [employer].   Patient drinks two cups of coffee daily.   Patient has a college education.    Family History  Problem Relation Age of Onset  . Cancer Father     Prostate  . Cancer Maternal Grandmother     died at age 80  . Cancer Maternal Grandfather   . Stroke Paternal Grandfather   . Atrial fibrillation    . Kidney disease      Past Medical History  Diagnosis Date  . Hypertension   . Heart attack   . Heart murmur     Past Surgical History  Procedure Laterality Date  . Vasectomy    . Cardiac valve replacement  Current Outpatient Prescriptions  Medication Sig Dispense Refill  . amlodipine-atorvastatin (CADUET) 10-20 MG per tablet Take 1 tablet by mouth daily.  90 tablet  3  . aspirin 81 MG tablet Take 81 mg by mouth daily.      Marland Kitchen FLUoxetine (PROZAC) 20 MG tablet Take 1 tablet (20 mg total) by mouth daily.  90 tablet  3  . metoprolol succinate (TOPROL XL) 50 MG 24 hr tablet Take 1 tablet (50 mg total) by mouth daily.  90 tablet  3  . Misc Natural Products (JOINT SUPPORT PO) Take by mouth.      . Multiple Vitamin (MULTIVITAMIN) tablet Take 1 tablet by mouth daily.      . Testosterone (ANDROGEL) 20.25 MG/1.25GM (1.62%) GEL Place 50.5 mg onto the skin every morning.  1.25 g  5   No current facility-administered medications for this visit.    Allergies as of 11/19/2013 - Review Complete 11/19/2013  Allergen Reaction Noted  . Amiodarone Rash 10/04/2011    Vitals: BP 134/84  Pulse 56   Resp 16  Ht 5\' 8"  (1.727 m)  Wt 307 lb (139.254 kg)  BMI 46.69 kg/m2 Last Weight:  Wt Readings from Last 1 Encounters:  11/19/13 307 lb (139.254 kg)   Last Height:   Ht Readings from Last 1 Encounters:  11/19/13 5\' 8"  (1.727 m)    Physical exam:  General: The patient is awake, alert and appears not in acute distress. The patient is well groomed. Head: Normocephalic, atraumatic. Neck is supple.  Status post tonsillectomy-Mallampati 2 , neck circumference: 20 inches.  Cardiovascular:  Regular rate and rhythm , without  murmurs or carotid bruit, and without distended neck veins. Respiratory: Lungs are clear to auscultation. Skin:  Without evidence of edema, or rash Trunk: BMI is  elevated and patient  has normal posture.  Neurologic exam : The patient is awake and alert, oriented to place and time.  Memory subjective described as intact. There is a normal attention span & concentration ability. Speech is fluent without dysarthria, dysphonia or aphasia. Mood and affect are appropriate.  Cranial nerves: Pupils are equal and briskly reactive to light. Funduscopic exam without  evidence of pallor or edema. Extraocular movements  in vertical and horizontal planes intact and without nystagmus.  Visual fields by finger perimetry are intact. Hearing to finger rub intact.  Facial sensation intact to fine touch. Facial motor strength is symmetric and tongue and uvula move midline.  Motor exam: Normal tone and normal muscle bulk and symmetric strength in all extremities.  Sensory:  Fine touch, pinprick and vibration were tested in all extremities and intact. Proprioception is normal.  Coordination: Rapid alternating movements in the fingers/hands is tested and normal.  Finger-to-nose maneuver tested and normal without evidence of ataxia, dysmetria or tremor.  Gait and station: Patient walks without assistive device and is able and assisted stool climb up to the exam table. Strength  normal.   Steps are unfragmented. Romberg testing is intact - negative .  Deep tendon reflexes: in the  upper and lower extremities are symmetric and intact.    Assessment:  After physical and neurologic examination, review of laboratory studies, imaging, neurophysiology testing and pre-existing records, assessment is ; 1) OSA on CPAP - degree of current apnea not known, but he has excellent compliance and reports symptoms of sleepiness, snoring and nocturia when not using CPAP>   Plan:  Treatment plan and additional workup :continue at current setting of 8 cm water with 1  cm EPR.

## 2013-12-26 ENCOUNTER — Encounter: Payer: Self-pay | Admitting: Neurology

## 2013-12-27 ENCOUNTER — Telehealth: Payer: Self-pay | Admitting: Neurology

## 2013-12-27 ENCOUNTER — Other Ambulatory Visit: Payer: Self-pay | Admitting: Neurology

## 2013-12-27 DIAGNOSIS — G4733 Obstructive sleep apnea (adult) (pediatric): Secondary | ICD-10-CM

## 2013-12-27 NOTE — Telephone Encounter (Signed)
Richia from [facility] calling to state that she has been requesting script for patient's CPAP supplies since end of June, has been calling and faxing and still hasn't heard anything. Patient needs renewal script for CPAP supplies as soon as possible, please call Richia back at [phone] ext [phone].

## 2013-12-27 NOTE — Telephone Encounter (Signed)
[name] [name] DOB 2051/01/18 Dr. Brett Fairy He needs renewal of prescription for CPAP supplies with [facility] (they have been waiting 5 weeks).  Please call patient at [phone] to let him know if this has been done.

## 2013-12-27 NOTE — Telephone Encounter (Signed)
The order was signed by Dr. Brett Fairy and faxed to [facility].  Sent to be scanned into the chart.  Left a vm for the patient letting him know that it was done, also.

## 2013-12-27 NOTE — Telephone Encounter (Signed)
Message given to [place], I haven't seen a request for cpap supplies for the patient come over to be signed.

## 2014-03-28 ENCOUNTER — Encounter: Payer: Self-pay | Admitting: Interventional Cardiology

## 2014-03-28 ENCOUNTER — Ambulatory Visit (INDEPENDENT_AMBULATORY_CARE_PROVIDER_SITE_OTHER): Payer: BC Managed Care – PPO | Admitting: Interventional Cardiology

## 2014-03-28 VITALS — BP 144/82 | HR 58 | Ht 68.0 in | Wt 312.0 lb

## 2014-03-28 DIAGNOSIS — Z9889 Other specified postprocedural states: Secondary | ICD-10-CM

## 2014-03-28 DIAGNOSIS — I10 Essential (primary) hypertension: Secondary | ICD-10-CM

## 2014-03-28 DIAGNOSIS — Z954 Presence of other heart-valve replacement: Secondary | ICD-10-CM

## 2014-03-28 DIAGNOSIS — G4730 Sleep apnea, unspecified: Secondary | ICD-10-CM

## 2014-03-28 DIAGNOSIS — Z8679 Personal history of other diseases of the circulatory system: Secondary | ICD-10-CM

## 2014-03-28 DIAGNOSIS — Z953 Presence of xenogenic heart valve: Secondary | ICD-10-CM

## 2014-03-28 DIAGNOSIS — E782 Mixed hyperlipidemia: Secondary | ICD-10-CM

## 2014-03-28 NOTE — Patient Instructions (Signed)
Your physician recommends that you continue on your current medications as directed. Please refer to the Current Medication list given to you today.  Please drop off copies of your recent lab work to our office  Your physician wants you to follow-up in: 1 year with Dr.Smith You will receive a reminder letter in the mail two months in advance. If you don't receive a letter, please call our office to schedule the follow-up appointment.

## 2014-03-28 NOTE — Progress Notes (Signed)
Patient ID: [name] [name], male   DOB: November 27, 1950, 63 y.o.   MRN: [id number] PROCEDURES PERFORMED:  1. Aortic valve replacement with 25-mm, Edwards Magnum pericardial  tissue valve.  2. Resection and grafting of ascending thoracic aortic aneurysm.  3. Left-sided Cox-Maze procedure.     [facility] [address], [address], [place]  [address] Phone: [phone] Fax:  [phone]  Date:  03/28/2014   ID:  [name] [name], DOB August 05, 1950, MRN [id number]  PCP:  Leandrew Koyanagi, MD   ASSESSMENT:  1. Bioprosthetic aortic valve replacement, functioning normally 2. Hypertension, stable 3. Hyperlipidemia, current status unknown 4. History of atrial fibrillation without recurrence. He is status post Maze procedure 5. Obesity 6. Obstructive sleep apnea  PLAN:  1. Obtain copies of the patient's most recent laboratory data to evaluate lipid status and liver function 2. Active lifestyle 3. Clinic follow-up in one year   SUBJECTIVE: [name] [name] is a 63 y.o. male who has no cardiovascular complaints. He is now on Cipro. He has not had palpitations, syncope, or transient neurological events. .  Wt Readings from Last 3 Encounters:  03/28/14 312 lb (141.522 kg)  11/19/13 307 lb (139.254 kg)  08/01/13 303 lb (137.44 kg)     Past Medical History  Diagnosis Date  . Hypertension   . Heart attack   . Heart murmur     Current Outpatient Prescriptions  Medication Sig Dispense Refill  . amlodipine-atorvastatin (CADUET) 10-20 MG per tablet Take 1 tablet by mouth daily.  90 tablet  3  . aspirin 81 MG tablet Take 81 mg by mouth daily.      . Cholecalciferol (VITAMIN D-3 PO) Take 5,000 Units by mouth.      Marland Kitchen FLUoxetine (PROZAC) 20 MG tablet Take 1 tablet (20 mg total) by mouth daily.  90 tablet  3  . metoprolol succinate (TOPROL XL) 50 MG 24 hr tablet Take 1 tablet (50 mg total) by mouth daily.  90 tablet  3  . Multiple Vitamin (MULTIVITAMIN) tablet Take 1 tablet by mouth daily.      Marland Kitchen  OVER THE COUNTER MEDICATION OmegaGenetics EPA DHA 500      . OVER THE COUNTER MEDICATION Inflamed      . OVER THE COUNTER MEDICATION Hepa Trope II      . PRESCRIPTION MEDICATION 20 mg. DHEA      . Testosterone (ANDROGEL) 20.25 MG/1.25GM (1.62%) GEL Place 50.5 mg onto the skin every morning.  1.25 g  5  . TURMERIC PO Take by mouth as directed.       No current facility-administered medications for this visit.    Allergies:    Allergies  Allergen Reactions  . Amiodarone Rash    Social History:  The patient  reports that he quit smoking about 10 years ago. He does not have any smokeless tobacco history on file. He reports that he drinks alcohol. He reports that he does not use illicit drugs.   ROS:  Please see the history of present illness.   No claudication or edema   All other systems reviewed and negative.   OBJECTIVE: VS:  BP 144/82  Pulse 58  Ht 5\' 8"  (1.727 m)  Wt 312 lb (141.522 kg)  BMI 47.45 kg/m2 Well nourished, well developed, in no acute distress, obese and beard HEENT: normal Neck: JVD flat. Carotid bruit absent  Cardiac:  normal S1, S2; RRR; Soft 1/6 systolic murmur Lungs:  clear to auscultation bilaterally, no wheezing,  rhonchi or rales Abd: soft, nontender, no hepatomegaly Ext: Edema absent. Pulses 2+ Skin: warm and dry Neuro:  CNs 2-12 intact, no focal abnormalities noted  EKG:  Sinus bradycardia       Signed, Illene Labrador III, MD 03/28/2014 8:18 AM

## 2014-05-10 ENCOUNTER — Telehealth: Payer: Self-pay

## 2014-05-10 NOTE — Telephone Encounter (Signed)
Left message reminding patient to get his flu shot.

## 2014-05-21 ENCOUNTER — Other Ambulatory Visit: Payer: Self-pay | Admitting: Internal Medicine

## 2014-08-06 ENCOUNTER — Telehealth: Payer: Self-pay

## 2014-08-06 NOTE — Telephone Encounter (Signed)
I called pt's wife about something else and after that discussion expressed concern for [facility]. Pt's wife wanted Dr Laney Pastor to know before pt's appt tomorrow that she is very worried about his heart. He had open heart surgery 5 yrs ago, and now for the first time since is c/o severe fatigue, SOB, and pt has trouble even standing for any length of time. He has gained a lot of weight also and Dori would like Dr Laney Pastor to check pt's heart out and eval these Sxs. She stated that he has been to his cardiologist and he says everything is fine, but pt has not had any stress tests, etc, and these new complaints has wife concerned. Dr Laney Pastor, [place].

## 2014-08-07 ENCOUNTER — Ambulatory Visit (INDEPENDENT_AMBULATORY_CARE_PROVIDER_SITE_OTHER): Payer: BLUE CROSS/BLUE SHIELD | Admitting: Internal Medicine

## 2014-08-07 ENCOUNTER — Encounter: Payer: Self-pay | Admitting: Internal Medicine

## 2014-08-07 ENCOUNTER — Other Ambulatory Visit: Payer: Self-pay | Admitting: Internal Medicine

## 2014-08-07 VITALS — BP 146/83 | HR 65 | Temp 97.7°F | Resp 16 | Ht 67.5 in | Wt 312.0 lb

## 2014-08-07 DIAGNOSIS — Z125 Encounter for screening for malignant neoplasm of prostate: Secondary | ICD-10-CM

## 2014-08-07 DIAGNOSIS — Z131 Encounter for screening for diabetes mellitus: Secondary | ICD-10-CM

## 2014-08-07 DIAGNOSIS — E782 Mixed hyperlipidemia: Secondary | ICD-10-CM | POA: Diagnosis not present

## 2014-08-07 DIAGNOSIS — Z1329 Encounter for screening for other suspected endocrine disorder: Secondary | ICD-10-CM | POA: Diagnosis not present

## 2014-08-07 DIAGNOSIS — Z6841 Body Mass Index (BMI) 40.0 and over, adult: Secondary | ICD-10-CM

## 2014-08-07 DIAGNOSIS — I10 Essential (primary) hypertension: Secondary | ICD-10-CM

## 2014-08-07 LAB — COMPLETE METABOLIC PANEL WITH GFR
ALT: 29 U/L (ref 0–53)
AST: 25 U/L (ref 0–37)
Albumin: 4.5 g/dL (ref 3.5–5.2)
Alkaline Phosphatase: 112 U/L (ref 39–117)
BILIRUBIN TOTAL: 0.6 mg/dL (ref 0.2–1.2)
BUN: 23 mg/dL (ref 6–23)
CHLORIDE: 104 meq/L (ref 96–112)
CO2: 22 mEq/L (ref 19–32)
CREATININE: 1.12 mg/dL (ref 0.50–1.35)
Calcium: 10 mg/dL (ref 8.4–10.5)
GFR, EST NON AFRICAN AMERICAN: 69 mL/min
GFR, Est African American: 80 mL/min
Glucose, Bld: 84 mg/dL (ref 70–99)
POTASSIUM: 4.3 meq/L (ref 3.5–5.3)
Sodium: 141 mEq/L (ref 135–145)
Total Protein: 7.2 g/dL (ref 6.0–8.3)

## 2014-08-07 LAB — CBC WITH DIFFERENTIAL/PLATELET
BASOS ABS: 0 10*3/uL (ref 0.0–0.1)
Basophils Relative: 0 % (ref 0–1)
Eosinophils Absolute: 0.6 10*3/uL (ref 0.0–0.7)
Eosinophils Relative: 6 % — ABNORMAL HIGH (ref 0–5)
HCT: 45.9 % (ref 39.0–52.0)
Hemoglobin: 15.4 g/dL (ref 13.0–17.0)
Lymphocytes Relative: 23 % (ref 12–46)
Lymphs Abs: 2.2 10*3/uL (ref 0.7–4.0)
MCH: 29.4 pg (ref 26.0–34.0)
MCHC: 33.6 g/dL (ref 30.0–36.0)
MCV: 87.6 fL (ref 78.0–100.0)
MPV: 9.3 fL (ref 8.6–12.4)
Monocytes Absolute: 0.7 10*3/uL (ref 0.1–1.0)
Monocytes Relative: 7 % (ref 3–12)
Neutro Abs: 6.1 10*3/uL (ref 1.7–7.7)
Neutrophils Relative %: 64 % (ref 43–77)
PLATELETS: 283 10*3/uL (ref 150–400)
RBC: 5.24 MIL/uL (ref 4.22–5.81)
RDW: 14.3 % (ref 11.5–15.5)
WBC: 9.6 10*3/uL (ref 4.0–10.5)

## 2014-08-07 LAB — TSH: TSH: 0.686 u[IU]/mL (ref 0.350–4.500)

## 2014-08-07 LAB — POCT GLYCOSYLATED HEMOGLOBIN (HGB A1C): Hemoglobin A1C: 6

## 2014-08-07 MED ORDER — FLUOXETINE HCL 20 MG PO TABS: 20.0000 mg | ORAL_TABLET | Freq: Every day | ORAL | Status: DC

## 2014-08-07 MED ORDER — AMLODIPINE-ATORVASTATIN 10-20 MG PO TABS
1.0000 | ORAL_TABLET | Freq: Every day | ORAL | Status: DC
Start: 2014-08-07 — End: 2015-03-24

## 2014-08-07 MED ORDER — METOPROLOL SUCCINATE ER 50 MG PO TB24: 50.0000 mg | ORAL_TABLET | Freq: Every day | ORAL | Status: DC

## 2014-08-07 NOTE — Progress Notes (Signed)
Subjective:    Patient ID: [name] [name], male    DOB: 1950/08/14, 64 y.o.   MRN: [id number]  HPI Patient presents for medication refill of Caduet, Metoprolol, and Prozac. States that he has tolerated each medication well and denies side effects of HA, dizziness, palpitations, or N/V. Additionallly denies SOB, CP, edema, fatigue, depressed mood, anxiety, or mood swings. 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 Has been trying to cut back on carb intake and is eating more chicken, fish, and vegetables. Has not been exercising as he has been more busy with work.  There is a phone message from his wife where she is concerned about his increased weight, his inability to do things without fatigue, shortness of breath, and about the possibility of his cardiologist missing any cardiac problems despite a recent healthy evaluation. As usual he discounts or concerns and stresses that for the most part he is still his activities always been and that there is actually been no weight gain.  Hmaintenance issues stable  Review of Systems  Constitutional: Negative for activity change, appetite change and fatigue.  Respiratory: Negative for shortness of breath and wheezing.   Cardiovascular: Negative for chest pain, palpitations and leg swelling.  Gastrointestinal: Negative for nausea, vomiting and abdominal pain.  Neurological: Negative for dizziness and headaches.  Psychiatric/Behavioral: Negative for behavioral problems and dysphoric mood. The patient is not nervous/anxious.        Objective:   Physical Exam  Constitutional: He is oriented to person, place, and time. He appears well-developed and well-nourished. No distress.  Blood pressure 146/83, pulse 65, temperature 97.7 F (36.5 C), resp. rate 16, height 5' 7.5" (1.715 m), weight 312 lb (141.522 kg), SpO2 97 %.  HENT:  Head: Normocephalic and atraumatic.  Right Ear:  External ear normal.  Left Ear: External ear normal.  Mouth/Throat: Oropharynx is clear and moist. No oropharyngeal exudate.  Eyes: Conjunctivae are normal. Pupils are equal, round, and reactive to light. Right eye exhibits no discharge. Left eye exhibits no discharge. No scleral icterus.  Neck: Neck supple. No thyromegaly present.  Cardiovascular: Normal rate, regular rhythm, normal heart sounds and intact distal pulses.  Exam reveals no gallop and no friction rub.   No murmur heard. Pulmonary/Chest: Effort normal and breath sounds normal. No respiratory distress. He has no wheezes. He has no rales. He exhibits no tenderness.  Musculoskeletal: He exhibits no edema or tenderness.  Lymphadenopathy:    He has no cervical adenopathy.  Neurological: He is alert and oriented to person, place, and time.  Skin: Skin is warm and dry. No rash noted. He is not diaphoretic. No erythema. No pallor.   Wt Readings from Last 3 Encounters:  08/07/14 312 lb (141.522 kg)  03/28/14 312 lb (141.522 kg)  11/19/13 307 lb (139.254 kg)       Assessment & Plan:  1. Essential hypertension 2. BMI 45.0-49.9, adult Discussed lifestyle modifications. - metoprolol succinate (TOPROL XL) 50 MG 24 hr tablet; Take 1 tablet (50 mg total) by mouth daily.  Dispense: 90 tablet; Refill: 3 - amlodipine-atorvastatin (CADUET) 10-20 MG per tablet; Take 1 tablet by mouth daily.  Dispense: 90 tablet; Refill: 3 - CBC with Differential/Platelet - COMPLETE METABOLIC PANEL WITH GFR  3. HYPERLIPIDEMIA, MIXED - amlodipine-atorvastatin (CADUET) 10-20 MG per tablet; Take 1 tablet by mouth daily.  Dispense: 90 tablet; Refill: 3  4. Screening for prostate cancer -  PSA  5. Screening for thyroid disorder - TSH  6. Screening for diabetes mellitus - POCT glycosylated hemoglobin (Hb A1C)   Shalunda Lindh PA-C  [facility] 08/07/2014 4:54 PM I have participated in the care of this  patient with the Advanced Practice Provider and agree with Diagnosis and Plan as documented. Robert P. Laney Pastor, M.D.

## 2014-08-08 ENCOUNTER — Encounter: Payer: Self-pay | Admitting: Internal Medicine

## 2014-08-08 LAB — PSA: PSA: 1.25 ng/mL (ref <=4.00)

## 2014-08-09 LAB — LIPID PANEL
Cholesterol: 175 mg/dL (ref 0–200)
HDL: 51 mg/dL (ref >=40)
LDL CALC: 100 mg/dL — AB (ref 0–99)
Total CHOL/HDL Ratio: 3.4 Ratio
Triglycerides: 120 mg/dL (ref <150)
VLDL: 24 mg/dL (ref 0–40)

## 2014-09-19 ENCOUNTER — Telehealth: Payer: Self-pay

## 2014-09-19 NOTE — Telephone Encounter (Signed)
Called and left message for patient we need to reschedule his apt. 11-21-2014 with Dr.Dohmeier. When patient calls please reschedule his apt. Patient will need a 15 minute apt. OSA CPAP . Please remind patient to bring his CPAP machine . Thanks Hinton Dyer.

## 2014-11-21 ENCOUNTER — Ambulatory Visit: Payer: BC Managed Care – PPO | Admitting: Neurology

## 2014-11-22 ENCOUNTER — Ambulatory Visit: Payer: BC Managed Care – PPO | Admitting: Neurology

## 2015-03-24 ENCOUNTER — Other Ambulatory Visit: Payer: Self-pay | Admitting: Family Medicine

## 2015-03-25 ENCOUNTER — Telehealth: Payer: Self-pay | Admitting: Interventional Cardiology

## 2015-03-25 NOTE — Telephone Encounter (Signed)
Spoke with the wife and she does not want husband to know she has spoken with anyone Per wife patient has gained a lot of weight and he is not exercising like he should Patient having more shortness of breath and at night when they are watching TV he is using his CPAP Will forward to Dr Tamala Julian so he can discuss at [facility]

## 2015-03-25 NOTE — Telephone Encounter (Signed)
New Message      Pt's wife stating that pt has gained a tremendous amount of weight, pt is having some issues sob, she is very concerned. States that pt won't ever tell Dr. Tamala Julian what is really going on with him. Please call back and advise.

## 2015-03-26 NOTE — Progress Notes (Signed)
[name] [name] at 03/25/2015 4:56 PM     Status: Signed       Expand All Collapse All   Spoke with the wife and she does not want husband to know she has spoken with anyone Per wife patient has gained a lot of weight and he is not exercising like he should Patient having more shortness of breath and at night when they are watching TV he is using his CPAP Will forward to Dr Tamala Julian so he can discuss at [facility]            Cardiology Office Note   Date:  07/02/2015   ID:  [name] [name], DOB Oct 17, 1950, MRN [id number]  PCP:  Lilian Coma, MD  Cardiologist:  Sinclair Grooms, MD   Chief Complaint  Patient presents with  . Cardiac Valve Problem      History of Present Illness: [name] [name] is a 64 y.o. male who presents for bicuspid aortic valve with aortic aneurysm status post Bentall procedure, maze procedure for paroxysmal atrial fibrillation, obesity, obstructive sleep apnea, and hypertension.  He is accompanied by his wife. She is concerned that he is short of breath all the time. He denies shortness of breath. She feels that she can hear him breathe. He has sleep apnea and she is concerned he wears see Pap while watching TV at night even when he's not asleep. She is also concerned about the possibility of lower extremity swelling. She complains it is present all the time but it just happens to be present today during the office visit. She is worried that he could be having episodes of atrial fibrillation. His entire family is concerned about his breathing when he tries to ambulate. 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    Past Medical History  Diagnosis Date  . Hypertension   . Heart attack ([place])   . Heart murmur     Past Surgical History  Procedure Laterality Date  . Vasectomy    . Cardiac valve replacement       Current Outpatient Prescriptions  Medication Sig Dispense  Refill  . amlodipine-atorvastatin (CADUET) 10-20 MG tablet TAKE 1 BY MOUTH DAILY  "OFFICE VISIT NEEDED FOR REFILLS" 90 tablet 0  . aspirin 81 MG tablet Take 81 mg by mouth daily.    Marland Kitchen FLUoxetine (PROZAC) 20 MG tablet TAKE 1 BY MOUTH DAILY -[name] [name]  "OFFICE VISIT NEEDED FOR REFILLS" 90 tablet 0  . metoprolol succinate (TOPROL-XL) 50 MG 24 hr tablet TAKE 1 BY MOUTH DAILY- [name] [name]  "OFFICE VISIT NEEDED FOR REFILLS" 90 tablet 0  . Multiple Vitamin (MULTIVITAMIN) tablet Take 1 tablet by mouth daily.    Marland Kitchen OVER THE COUNTER MEDICATION Take 2 tablets by mouth daily. Med Name: OSTEO BI-FLEX    . OVER THE COUNTER MEDICATION Take 2 tablets by mouth daily. Med Name: ADRENAL HEALTH    . OVER THE COUNTER MEDICATION Take 2 tablets by mouth at bedtime. Med Name: SLEEP THRU     No current facility-administered medications for this visit.    Allergies:   Amiodarone    Social History:  The patient  reports that he quit smoking about 11 years ago. He has never used smokeless tobacco. He reports that he drinks alcohol. He reports that he does not use illicit drugs.   Family History:  The patient's family history  includes Cancer in his father, maternal grandfather, and maternal grandmother; Stroke in his paternal grandfather.    ROS:  Please see the history of present illness.   Otherwise, review of systems are positive for snoring, decreased ability to ambulate due to lower extremity discomfort from knee arthritis..   All other systems are reviewed and negative.    PHYSICAL EXAM: VS:  BP 134/84 mmHg  Pulse 60  Ht 5\' 11"  (1.803 m)  Wt 320 lb (145.151 kg)  BMI 44.65 kg/m2 , BMI Body mass index is 44.65 kg/(m^2). GEN: Well nourished, well developed, in no acute distressMorbidly obese. Large lipoma right flank. HEENT: normal Neck: no JVD, carotid bruits, or masses Cardiac: RRR.  There is no murmur, rub, or gallop. There is no edema. Respiratory:  clear to auscultation bilaterally,  normal work of breathing. GI: soft, nontender, nondistended, + BS MS: no deformity or atrophy Skin: warm and dry, no rash Neuro:  Strength and sensation are intact Psych: euthymic mood, full affect   EKG:  EKG is ordered today. The ekg reveals sinus rhythm with QS pattern V1 through the 2   Recent Labs: 08/07/2014: ALT 29; BUN 23; Creat 1.12; Hemoglobin 15.4; Platelets 283; Potassium 4.3; Sodium 141; TSH 0.686    Lipid Panel    Component Value Date/Time   CHOL 175 08/07/2014 1650   TRIG 120 08/07/2014 1650   HDL 51 08/07/2014 1650   CHOLHDL 3.4 08/07/2014 1650   VLDL 24 08/07/2014 1650   LDLCALC 100* 08/07/2014 1650      Wt Readings from Last 3 Encounters:  07/02/15 320 lb (145.151 kg)  08/07/14 312 lb (141.522 kg)  03/28/14 312 lb (141.522 kg)      Other studies Reviewed: Additional studies/ records that were reviewed today include: Electronic health record. Last echocardiogram was performed in 2010.Marland Kitchen The findings include at that time and was normally functioning bioprosthetic aortic valve. Normal LV size and function.    ASSESSMENT AND PLAN:  1. 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 Operation was performed in 2008.   2. S/P ascending aortic aneurysm repair Status post repair  3. Essential hypertension Excellent control  4. HYPERLIPIDEMIA, MIXED Adequate control when last evaluated 9 months ago  5. Hx of maze procedure No recurrence of atrial fibrillation that we are aware of.  6. Dyspnea, probably multifactorial related to obesity, deconditioning, diastolic heart dysfunction, hypertension, and perhaps relative aortic stenosis status post valve replacement.   Current medicines are reviewed at length with the patient today.  The patient has the following concerns regarding medicines: Reviewed and no specific abnormalities are felt to be present.  The following changes/actions have been instituted:    BNP, CBC ,BMET to  explore the potential causes for dyspnea.  2-D Doppler echocardiogram to follow-up bioprosthetic aortic valve  48 hour Holter monitor  Labs/ tests ordered today include:  No orders of the defined types were placed in this encounter.     Disposition:   FU with HS in 1 year  Signed, Sinclair Grooms, MD  07/02/2015 4:42 PM    [address], [place], [place]  [address] Phone: [phone]; Fax: [phone]

## 2015-04-28 ENCOUNTER — Encounter: Payer: Self-pay | Admitting: Internal Medicine

## 2015-05-07 ENCOUNTER — Other Ambulatory Visit: Payer: Self-pay | Admitting: Family Medicine

## 2015-05-07 DIAGNOSIS — D179 Benign lipomatous neoplasm, unspecified: Secondary | ICD-10-CM

## 2015-05-15 ENCOUNTER — Ambulatory Visit
Admission: RE | Admit: 2015-05-15 | Discharge: 2015-05-15 | Disposition: A | Discharge status: Home / Self Care | Payer: BLUE CROSS/BLUE SHIELD | Source: Ambulatory Visit | Attending: Family Medicine | Admitting: Family Medicine

## 2015-05-15 DIAGNOSIS — D179 Benign lipomatous neoplasm, unspecified: Secondary | ICD-10-CM

## 2015-05-20 NOTE — Progress Notes (Signed)
ENTERED IN ERROR

## 2015-05-22 ENCOUNTER — Encounter: Payer: Self-pay | Admitting: Physician Assistant

## 2015-07-02 ENCOUNTER — Ambulatory Visit (INDEPENDENT_AMBULATORY_CARE_PROVIDER_SITE_OTHER): Payer: BLUE CROSS/BLUE SHIELD | Admitting: Interventional Cardiology

## 2015-07-02 ENCOUNTER — Encounter: Payer: Self-pay | Admitting: Interventional Cardiology

## 2015-07-02 VITALS — BP 134/84 | HR 60 | Ht 71.0 in | Wt 320.0 lb

## 2015-07-02 DIAGNOSIS — Z9889 Other specified postprocedural states: Secondary | ICD-10-CM | POA: Diagnosis not present

## 2015-07-02 DIAGNOSIS — Z953 Presence of xenogenic heart valve: Secondary | ICD-10-CM

## 2015-07-02 DIAGNOSIS — Z954 Presence of other heart-valve replacement: Secondary | ICD-10-CM | POA: Diagnosis not present

## 2015-07-02 DIAGNOSIS — I10 Essential (primary) hypertension: Secondary | ICD-10-CM | POA: Diagnosis not present

## 2015-07-02 DIAGNOSIS — R002 Palpitations: Secondary | ICD-10-CM

## 2015-07-02 DIAGNOSIS — E782 Mixed hyperlipidemia: Secondary | ICD-10-CM

## 2015-07-02 DIAGNOSIS — Z8679 Personal history of other diseases of the circulatory system: Secondary | ICD-10-CM

## 2015-07-02 DIAGNOSIS — R0600 Dyspnea, unspecified: Secondary | ICD-10-CM

## 2015-07-02 LAB — BASIC METABOLIC PANEL
BUN: 23 mg/dL (ref 7–25)
CALCIUM: 9.7 mg/dL (ref 8.6–10.3)
CO2: 25 mmol/L (ref 20–31)
CREATININE: 1.29 mg/dL — AB (ref 0.70–1.25)
Chloride: 103 mmol/L (ref 98–110)
Glucose, Bld: 106 mg/dL — ABNORMAL HIGH (ref 65–99)
Potassium: 4.2 mmol/L (ref 3.5–5.3)
SODIUM: 139 mmol/L (ref 135–146)

## 2015-07-02 LAB — CBC WITH DIFFERENTIAL/PLATELET
BASOS PCT: 0 % (ref 0–1)
Basophils Absolute: 0 10*3/uL (ref 0.0–0.1)
EOS ABS: 0.6 10*3/uL (ref 0.0–0.7)
EOS PCT: 6 % — AB (ref 0–5)
HEMATOCRIT: 44.7 % (ref 39.0–52.0)
Hemoglobin: 14.6 g/dL (ref 13.0–17.0)
Lymphocytes Relative: 23 % (ref 12–46)
Lymphs Abs: 2.3 10*3/uL (ref 0.7–4.0)
MCH: 29.1 pg (ref 26.0–34.0)
MCHC: 32.7 g/dL (ref 30.0–36.0)
MCV: 89 fL (ref 78.0–100.0)
MONOS PCT: 8 % (ref 3–12)
MPV: 9.7 fL (ref 8.6–12.4)
Monocytes Absolute: 0.8 10*3/uL (ref 0.1–1.0)
NEUTROS ABS: 6.3 10*3/uL (ref 1.7–7.7)
NEUTROS PCT: 63 % (ref 43–77)
PLATELETS: 281 10*3/uL (ref 150–400)
RBC: 5.02 MIL/uL (ref 4.22–5.81)
RDW: 14.6 % (ref 11.5–15.5)
WBC: 10 10*3/uL (ref 4.0–10.5)

## 2015-07-02 NOTE — Patient Instructions (Signed)
Medication Instructions:  Your physician recommends that you continue on your current medications as directed. Please refer to the Current Medication list given to you today.   Labwork: Bmet, Bnp, Cbc  Testing/Procedures: Your physician has requested that you have an echocardiogram. Echocardiography is a painless test that uses sound waves to create images of your heart. It provides your doctor with information about the size and shape of your heart and how well your heart's chambers and valves are working. This procedure takes approximately one hour. There are no restrictions for this procedure.  Your physician has recommended that you wear a holter monitor. Holter monitors are medical devices that record the heart's electrical activity. Doctors most often use these monitors to diagnose arrhythmias. Arrhythmias are problems with the speed or rhythm of the heartbeat. The monitor is a small, portable device. You can wear one while you do your normal daily activities. This is usually used to diagnose what is causing palpitations/syncope (passing out).    Follow-Up: Your physician wants you to follow-up in: 1 year or sooner pending results You will receive a reminder letter in the mail two months in advance. If you don't receive a letter, please call our office to schedule the follow-up appointment.   Any Other Special Instructions Will Be Listed Below (If Applicable).     If you need a refill on your cardiac medications before your next appointment, please call your pharmacy.

## 2015-07-03 LAB — BRAIN NATRIURETIC PEPTIDE: BRAIN NATRIURETIC PEPTIDE: 25.2 pg/mL (ref 0.0–100.0)

## 2015-07-14 ENCOUNTER — Other Ambulatory Visit (HOSPITAL_COMMUNITY): Payer: BLUE CROSS/BLUE SHIELD

## 2015-07-21 ENCOUNTER — Ambulatory Visit (HOSPITAL_COMMUNITY): Payer: BLUE CROSS/BLUE SHIELD | Attending: Cardiovascular Disease

## 2015-07-21 ENCOUNTER — Other Ambulatory Visit: Payer: Self-pay

## 2015-07-21 ENCOUNTER — Ambulatory Visit (INDEPENDENT_AMBULATORY_CARE_PROVIDER_SITE_OTHER): Payer: BLUE CROSS/BLUE SHIELD

## 2015-07-21 DIAGNOSIS — I119 Hypertensive heart disease without heart failure: Secondary | ICD-10-CM | POA: Insufficient documentation

## 2015-07-21 DIAGNOSIS — G4733 Obstructive sleep apnea (adult) (pediatric): Secondary | ICD-10-CM | POA: Diagnosis not present

## 2015-07-21 DIAGNOSIS — Z952 Presence of prosthetic heart valve: Secondary | ICD-10-CM | POA: Insufficient documentation

## 2015-07-21 DIAGNOSIS — R002 Palpitations: Secondary | ICD-10-CM

## 2015-07-21 DIAGNOSIS — Z954 Presence of other heart-valve replacement: Secondary | ICD-10-CM | POA: Diagnosis not present

## 2015-07-21 DIAGNOSIS — I359 Nonrheumatic aortic valve disorder, unspecified: Secondary | ICD-10-CM | POA: Insufficient documentation

## 2015-07-21 DIAGNOSIS — Z953 Presence of xenogenic heart valve: Secondary | ICD-10-CM

## 2015-07-23 ENCOUNTER — Telehealth: Payer: Self-pay | Admitting: Interventional Cardiology

## 2015-07-23 NOTE — Telephone Encounter (Signed)
Pt just finish wearing a monitor.She wants you to know when you review it,that Monday patient slept all day. Tuesday one of the lead came off and he put it back on.He will return the monitor late this afternoon.

## 2015-07-24 NOTE — Telephone Encounter (Signed)
Adv pt that we will call him when his cardiac monitor results are received and reviewed by Dr.Smith. Pt verbalized understanding.

## 2015-10-14 ENCOUNTER — Other Ambulatory Visit: Payer: Self-pay | Admitting: Internal Medicine

## 2015-10-20 ENCOUNTER — Other Ambulatory Visit: Payer: Self-pay | Admitting: Internal Medicine

## 2015-10-21 NOTE — Telephone Encounter (Signed)
What is patient follow up plan.  Overdue for office visit .

## 2015-10-22 NOTE — Telephone Encounter (Signed)
LMOM at [facility] # (per HIPPA), advising I had BP med req sent to cardiologist and that I need for pt to RTC for f/up OR call me to advise of f/up in order to get a few tabs of prozac until then. Will deny w/note to have pt contact us.

## 2015-10-28 ENCOUNTER — Other Ambulatory Visit: Payer: Self-pay

## 2015-10-28 MED ORDER — METOPROLOL SUCCINATE ER 50 MG PO TB24: ORAL_TABLET | ORAL | Status: DC

## 2015-10-28 MED ORDER — AMLODIPINE-ATORVASTATIN 10-20 MG PO TABS: ORAL_TABLET | ORAL | Status: DC

## 2016-02-12 DIAGNOSIS — J069 Acute upper respiratory infection, unspecified: Secondary | ICD-10-CM | POA: Diagnosis not present

## 2016-02-18 DIAGNOSIS — R062 Wheezing: Secondary | ICD-10-CM | POA: Diagnosis not present

## 2016-02-18 DIAGNOSIS — J069 Acute upper respiratory infection, unspecified: Secondary | ICD-10-CM | POA: Diagnosis not present

## 2016-02-20 ENCOUNTER — Ambulatory Visit
Admission: RE | Admit: 2016-02-20 | Discharge: 2016-02-20 | Disposition: A | Discharge status: Home / Self Care | Payer: BLUE CROSS/BLUE SHIELD | Source: Ambulatory Visit | Attending: Family Medicine | Admitting: Family Medicine

## 2016-02-20 ENCOUNTER — Other Ambulatory Visit: Payer: Self-pay | Admitting: Family Medicine

## 2016-02-20 DIAGNOSIS — R0602 Shortness of breath: Secondary | ICD-10-CM | POA: Diagnosis not present

## 2016-02-20 DIAGNOSIS — R05 Cough: Secondary | ICD-10-CM

## 2016-02-20 DIAGNOSIS — R059 Cough, unspecified: Secondary | ICD-10-CM

## 2016-04-17 DIAGNOSIS — J069 Acute upper respiratory infection, unspecified: Secondary | ICD-10-CM | POA: Diagnosis not present

## 2016-04-17 DIAGNOSIS — J209 Acute bronchitis, unspecified: Secondary | ICD-10-CM | POA: Diagnosis not present

## 2016-06-21 DIAGNOSIS — G4733 Obstructive sleep apnea (adult) (pediatric): Secondary | ICD-10-CM | POA: Diagnosis not present

## 2016-06-23 ENCOUNTER — Ambulatory Visit (INDEPENDENT_AMBULATORY_CARE_PROVIDER_SITE_OTHER): Payer: BLUE CROSS/BLUE SHIELD

## 2016-06-23 ENCOUNTER — Ambulatory Visit (INDEPENDENT_AMBULATORY_CARE_PROVIDER_SITE_OTHER): Payer: BLUE CROSS/BLUE SHIELD | Admitting: Urgent Care

## 2016-06-23 VITALS — BP 110/78 | HR 63 | Temp 97.7°F | Resp 18 | Ht 71.0 in | Wt 315.0 lb

## 2016-06-23 DIAGNOSIS — R0602 Shortness of breath: Secondary | ICD-10-CM | POA: Diagnosis not present

## 2016-06-23 DIAGNOSIS — R059 Cough, unspecified: Secondary | ICD-10-CM

## 2016-06-23 DIAGNOSIS — R05 Cough: Secondary | ICD-10-CM | POA: Diagnosis not present

## 2016-06-23 DIAGNOSIS — I252 Old myocardial infarction: Secondary | ICD-10-CM | POA: Diagnosis not present

## 2016-06-23 DIAGNOSIS — R062 Wheezing: Secondary | ICD-10-CM

## 2016-06-23 DIAGNOSIS — J209 Acute bronchitis, unspecified: Secondary | ICD-10-CM

## 2016-06-23 DIAGNOSIS — Z952 Presence of prosthetic heart valve: Secondary | ICD-10-CM

## 2016-06-23 LAB — POCT CBC
Granulocyte percent: 74.7 %G (ref 37–80)
HEMATOCRIT: 43 % — AB (ref 43.5–53.7)
HEMOGLOBIN: 14.7 g/dL (ref 14.1–18.1)
LYMPH, POC: 1.2 (ref 0.6–3.4)
MCH, POC: 29.8 pg (ref 27–31.2)
MCHC: 34.3 g/dL (ref 31.8–35.4)
MCV: 87 fL (ref 80–97)
MID (CBC): 0.7 (ref 0–0.9)
MPV: 7.1 fL (ref 0–99.8)
POC GRANULOCYTE: 5.8 (ref 2–6.9)
POC LYMPH %: 15.9 % (ref 10–50)
POC MID %: 9.4 % (ref 0–12)
Platelet Count, POC: 263 10*3/uL (ref 142–424)
RBC: 4.94 M/uL (ref 4.69–6.13)
RDW, POC: 14.6 %
WBC: 7.8 10*3/uL (ref 4.6–10.2)

## 2016-06-23 MED ORDER — DOCUSATE SODIUM 50 MG PO CAPS: 50.0000 mg | ORAL_CAPSULE | Freq: Two times a day (BID) | ORAL | 0 refills | Status: DC

## 2016-06-23 MED ORDER — HYDROCODONE-HOMATROPINE 5-1.5 MG/5ML PO SYRP
5.0000 mL | ORAL_SOLUTION | Freq: Every evening | ORAL | 0 refills | Status: DC | PRN
Start: 2016-06-23 — End: 2016-08-16

## 2016-06-23 MED ORDER — AZITHROMYCIN 250 MG PO TABS: ORAL_TABLET | ORAL | 0 refills | Status: DC

## 2016-06-23 NOTE — Patient Instructions (Addendum)
Acute Bronchitis, Adult Acute bronchitis is sudden (acute) swelling of the air tubes (bronchi) in the lungs. Acute bronchitis causes these tubes to fill with mucus, which can make it hard to breathe. It can also cause coughing or wheezing. In adults, acute bronchitis usually goes away within 2 weeks. A cough caused by bronchitis may last up to 3 weeks. Smoking, allergies, and asthma can make the condition worse. Repeated episodes of bronchitis may cause further lung problems, such as chronic obstructive pulmonary disease (COPD). What are the causes? This condition can be caused by germs and by substances that irritate the lungs, including:  Cold and flu viruses. This condition is most often caused by the same virus that causes a cold.  Bacteria.  Exposure to tobacco smoke, dust, fumes, and air pollution. What increases the risk? This condition is more likely to develop in people who:  Have close contact with someone with acute bronchitis.  Are exposed to lung irritants, such as tobacco smoke, dust, fumes, and vapors.  Have a weak immune system.  Have a respiratory condition such as asthma. What are the signs or symptoms? Symptoms of this condition include:  A cough.  Coughing up clear, yellow, or green mucus.  Wheezing.  Chest congestion.  Shortness of breath.  A fever.  Body aches.  Chills.  A sore throat. How is this diagnosed? This condition is usually diagnosed with a physical exam. During the exam, your health care provider may order tests, such as chest X-rays, to rule out other conditions. He or she may also:  Test a sample of your mucus for bacterial infection.  Check the level of oxygen in your blood. This is done to check for pneumonia.  Do a chest X-ray or lung function testing to rule out pneumonia and other conditions.  Perform blood tests. Your health care provider will also ask about your symptoms and medical history. How is this treated? Most cases  of acute bronchitis clear up over time without treatment. Your health care provider may recommend:  Drinking more fluids. Drinking more makes your mucus thinner, which may make it easier to breathe.  Taking a medicine for a fever or cough.  Taking an antibiotic medicine.  Using an inhaler to help improve shortness of breath and to control a cough.  Using a cool mist vaporizer or humidifier to make it easier to breathe. Follow these instructions at home: Medicines  Take over-the-counter and prescription medicines only as told by your health care provider.  If you were prescribed an antibiotic, take it as told by your health care provider. Do not stop taking the antibiotic even if you start to feel better. General instructions  Get plenty of rest.  Drink enough fluids to keep your urine clear or pale yellow.  Avoid smoking and secondhand smoke. Exposure to cigarette smoke or irritating chemicals will make bronchitis worse. If you smoke and you need help quitting, ask your health care provider. Quitting smoking will help your lungs heal faster.  Use an inhaler, cool mist vaporizer, or humidifier as told by your health care provider.  Keep all follow-up visits as told by your health care provider. This is important. How is this prevented? To lower your risk of getting this condition again:  Wash your hands often with soap and water. If soap and water are not available, use hand sanitizer.  Avoid contact with people who have cold symptoms.  Try not to touch your hands to your mouth, nose, or eyes.    eyes.  Make sure to get the flu shot every year. Contact a health care provider if:  Your symptoms do not improve in 2 weeks of treatment. Get help right away if:  You cough up blood.  You have chest pain.  You have severe shortness of breath.  You become dehydrated.  You faint or keep feeling like you are going to faint.  You keep vomiting.  You have a severe headache.  Your  fever or chills gets worse. This information is not intended to replace advice given to you by your health care provider. Make sure you discuss any questions you have with your health care provider. Document Released: 06/24/2004 Document Revised: 12/10/2015 Document Reviewed: 11/05/2015 Elsevier Interactive Patient Education  2017 Elsevier Inc.     IF you received an x-ray today, you will receive an invoice from [facility]. Please contact [facility] at [phone] with questions or concerns regarding your invoice.   IF you received labwork today, you will receive an invoice from [facility]. Please contact [facility] at [phone] with questions or concerns regarding your invoice.   Our billing staff will not be able to assist you with questions regarding bills from these companies.  You will be contacted with the lab results as soon as they are available. The fastest way to get your results is to activate your My Chart account. Instructions are located on the last page of this paperwork. If you have not heard from us regarding the results in 2 weeks, please contact this office.     

## 2016-06-23 NOTE — Progress Notes (Signed)
MRN: [id number] DOB: [birth year]  Subjective:   [name] [name] is a 66 y.o. male presenting for chief complaint of Cough  Reports 4 day history of worsening cough, shob, wheezing, nasal congestion. Cough elicits throat pain. Has had multiple sick contacts. Has tried Delsym for his cough. Denies fever, chest pain, n/v, abdominal pain, rashes, sinus pain, eye pain, ear pain. Has a history of OSA, wears CPAP nightly. Has a history of heart valve replacement, history of MI. Denies smoking cigarettes. Denies history of allergies, asthma.  [name] [name] has a current medication list which includes the following prescription(s): amlodipine-atorvastatin, aspirin, metoprolol succinate, multivitamin, OVER THE COUNTER MEDICATION, OVER THE COUNTER MEDICATION, and OVER THE COUNTER MEDICATION. Also is allergic to amiodarone.  [name] [name]  has a past medical history of Heart attack; Heart murmur; and Hypertension. Also  has a past surgical history that includes Vasectomy and Cardiac valve replacement.  Objective:   Vitals: BP 110/78   Pulse 63   Temp 97.7 F (36.5 C) (Oral)   Resp 18   Ht 5\' 11"  (1.803 m)   Wt (!) 315 lb (142.9 kg)   SpO2 95%   BMI 43.93 kg/m   Physical Exam  Constitutional: He is oriented to person, place, and time. He appears well-developed and well-nourished.  HENT:  TM's intact bilaterally, no effusions or erythema. Nasal turbinates pink and moist, nasal passages patent. No sinus tenderness. Oropharynx clear, mucous membranes moist, dentition in good repair.  Eyes: Right eye exhibits no discharge. Left eye exhibits no discharge. No scleral icterus.  Neck: Normal range of motion. Neck supple.  Cardiovascular: Normal rate, regular rhythm and intact distal pulses.  Exam reveals no gallop and no friction rub.   No murmur heard. Pulmonary/Chest: No respiratory distress. He has no wheezes. He has rales (bilateral, mid-lower lung fields).  Lymphadenopathy:    He has no cervical adenopathy.    Neurological: He is alert and oriented to person, place, and time.  Skin: Skin is warm and dry.   Dg Chest 2 View  Result Date: 06/23/2016 CLINICAL DATA:  Cough, shortness of breath, wheezing EXAM: CHEST  2 VIEW COMPARISON:  Chest x-ray of 02/20/2016 FINDINGS: No active infiltrate or effusion is seen. There is some peribronchial thickening which may indicate bronchitis. Mediastinal and hilar contours are stable with slight lobular contour of the left aortopulmonary window being unchanged. This may simply represent somewhat ectatic descending thoracic aorta and overlapping pulmonary artery, but if clinically suspicious of chest pathology, a CT the chest with IV contrast media would be recommended. I did review the CT angio chest of 02/27/2007 with no definite abnormality noted at that site. The heart is within upper limits normal and aortic valve replacement is noted with median sternotomy sutures present. There are degenerative changes in the mid to lower thoracic spine. IMPRESSION: 1. No definite active process. Mild peribronchial thickening may indicate bronchitis. 2. Somewhat lobular contour of the left aortopulmonary window has not changed significantly compared to prior images as noted above. Consider either followup chest x-ray or CT of the chest with IV contrast to assess further if warranted. Electronically Signed   By: Ivar Drape M.D.   On: 06/23/2016 09:33   Results for orders placed or performed in visit on 06/23/16 (from the past 24 hour(s))  POCT CBC     Status: Abnormal   Collection Time: 06/23/16  9:23 AM  Result Value Ref Range   WBC 7.8 4.6 - 10.2 K/uL   Lymph, poc 1.2 0.6 -  3.4   POC LYMPH PERCENT 15.9 10 - 50 %L   MID (cbc) 0.7 0 - 0.9   POC MID % 9.4 0 - 12 %M   POC Granulocyte 5.8 2 - 6.9   Granulocyte percent 74.7 37 - 80 %G   RBC 4.94 4.69 - 6.13 M/uL   Hemoglobin 14.7 14.1 - 18.1 g/dL   HCT, POC 43.0 (A) 43.5 - 53.7 %   MCV 87.0 80 - 97 fL   MCH, POC 29.8 27 - 31.2 pg    MCHC 34.3 31.8 - 35.4 g/dL   RDW, POC 14.6 %   Platelet Count, POC 263 142 - 424 K/uL   MPV 7.1 0 - 99.8 fL    Assessment and Plan :   1. Acute bronchitis, unspecified organism 2. Cough 3. Shortness of breath 4. Wheezing 5. History of MI (myocardial infarction) 6. History of heart valve replacement - Will cover for bronchitis with azithromycin, f/u in 1 week. Reviewed abnormal x-ray with patient, we will consider f/u chest x-ray or chest CT depending on symptom progression. Patient is in agreement.  [name], PA-C [facility] [phone] 06/23/2016  8:47 AM

## 2016-06-24 LAB — BRAIN NATRIURETIC PEPTIDE: BNP: 17.7 pg/mL (ref 0.0–100.0)

## 2016-07-01 ENCOUNTER — Ambulatory Visit (INDEPENDENT_AMBULATORY_CARE_PROVIDER_SITE_OTHER): Payer: BLUE CROSS/BLUE SHIELD | Admitting: Urgent Care

## 2016-07-01 ENCOUNTER — Encounter: Payer: Self-pay | Admitting: Urgent Care

## 2016-07-01 VITALS — BP 145/82 | HR 64 | Temp 98.6°F | Resp 18 | Ht 71.0 in | Wt 311.0 lb

## 2016-07-01 DIAGNOSIS — J209 Acute bronchitis, unspecified: Secondary | ICD-10-CM | POA: Diagnosis not present

## 2016-07-01 DIAGNOSIS — R05 Cough: Secondary | ICD-10-CM

## 2016-07-01 DIAGNOSIS — R059 Cough, unspecified: Secondary | ICD-10-CM

## 2016-07-01 NOTE — Progress Notes (Signed)
    MRN: [id number] DOB: [birth year]  Subjective:   [name] [name] is a 66 y.o. male presenting for follow up on bronchitis. His symptoms are significantly improved but still has a cough. Denies fever, chest pain, shob, wheezing. Last chest x-ray was abnormal, compared to chest CT from 01/2007. There was a recommendation to consider follow up chest x-ray or chest CT if there was clinical suspicion for pathology. 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 Patient will see Dr. Daneen Schick, his cardiologist, in April 2018.   [name] has a current medication list which includes the following prescription(s): amlodipine-atorvastatin, aspirin, docusate sodium, metoprolol succinate, multivitamin, OVER THE COUNTER MEDICATION, OVER THE COUNTER MEDICATION, OVER THE COUNTER MEDICATION, azithromycin, and hydrocodone-homatropine. Also is allergic to amiodarone.  [name]  has a past medical history of Heart attack; Heart murmur; and Hypertension. Also  has a past surgical history that includes Vasectomy and Cardiac valve replacement.  Objective:   Vitals: BP (!) 145/82   Pulse 64   Temp 98.6 F (37 C) (Oral)   Resp 18   Ht 5\' 11"  (1.803 m)   Wt (!) 311 lb (141.1 kg)   SpO2 93%   BMI 43.38 kg/m   Physical Exam  Constitutional: He is oriented to person, place, and time. He appears well-developed and well-nourished.  Cardiovascular: Normal rate, regular rhythm and intact distal pulses.  Exam reveals no gallop and no friction rub.   No murmur heard. Pulmonary/Chest: No respiratory distress. He has no wheezes. He has no rales.    Neurological: He is alert and oriented to person, place, and time.   Assessment and Plan :   1. Acute bronchitis, unspecified organism 2. Cough - Significantly improved. I will forward notes to Dr. Daneen Schick for his recommendations per patient's request. I did counsel [name] that we could obtain x-ray in 4 weeks or just wait and see what Dr. Tamala Julian  would like to do but he opted for the latter. Anticipatory guidance provided.  [name], PA-C [facility] [phone] 07/01/2016 8:08 AM

## 2016-07-01 NOTE — Progress Notes (Signed)
I reviewed the x -ray and am not impressed, however I recommend repeat in 6 weeks.

## 2016-07-01 NOTE — Patient Instructions (Addendum)
    Acute Bronchitis, Adult Acute bronchitis is when air tubes (bronchi) in the lungs suddenly get swollen. The condition can make it hard to breathe. It can also cause these symptoms:  A cough.  Coughing up clear, yellow, or green mucus.  Wheezing.  Chest congestion.  Shortness of breath.  A fever.  Body aches.  Chills.  A sore throat. Follow these instructions at home: Medicines   Take over-the-counter and prescription medicines only as told by your doctor.  If you were prescribed an antibiotic medicine, take it as told by your doctor. Do not stop taking the antibiotic even if you start to feel better. General instructions   Rest.  Drink enough fluids to keep your pee (urine) clear or pale yellow.  Avoid smoking and secondhand smoke. If you smoke and you need help quitting, ask your doctor. Quitting will help your lungs heal faster.  Use an inhaler, cool mist vaporizer, or humidifier as told by your doctor.  Keep all follow-up visits as told by your doctor. This is important. How is this prevented? To lower your risk of getting this condition again:  Wash your hands often with soap and water. If you cannot use soap and water, use hand sanitizer.  Avoid contact with people who have cold symptoms.  Try not to touch your hands to your mouth, nose, or eyes.  Make sure to get the flu shot every year. Contact a doctor if:  Your symptoms do not get better in 2 weeks. Get help right away if:  You cough up blood.  You have chest pain.  You have very bad shortness of breath.  You become dehydrated.  You faint (pass out) or keep feeling like you are going to pass out.  You keep throwing up (vomiting).  You have a very bad headache.  Your fever or chills gets worse. This information is not intended to replace advice given to you by your health care provider. Make sure you discuss any questions you have with your health care provider. Document Released:  11/03/2007 Document Revised: 12/24/2015 Document Reviewed: 11/05/2015 Elsevier Interactive Patient Education  2017 Elsevier Inc.   IF you received an x-ray today, you will receive an invoice from [facility]. Please contact [facility] at [phone] with questions or concerns regarding your invoice.   IF you received labwork today, you will receive an invoice from [facility]. Please contact [facility] at [phone] with questions or concerns regarding your invoice.   Our billing staff will not be able to assist you with questions regarding bills from these companies.  You will be contacted with the lab results as soon as they are available. The fastest way to get your results is to activate your My Chart account. Instructions are located on the last page of this paperwork. If you have not heard from us regarding the results in 2 weeks, please contact this office.      

## 2016-07-07 NOTE — Progress Notes (Signed)
Patient will set up an appointment for f/u in 6 weeks for a recheck, cxr repeat.

## 2016-07-20 DIAGNOSIS — G4733 Obstructive sleep apnea (adult) (pediatric): Secondary | ICD-10-CM | POA: Diagnosis not present

## 2016-08-10 ENCOUNTER — Encounter: Payer: Self-pay | Admitting: Interventional Cardiology

## 2016-08-15 DIAGNOSIS — I480 Paroxysmal atrial fibrillation: Secondary | ICD-10-CM | POA: Insufficient documentation

## 2016-08-15 NOTE — Progress Notes (Signed)
Cardiology Office Note    Date:  08/16/2016   ID:  [name] [name], DOB 11-19-1950, MRN [id number]  PCP:  Lilian Coma, MD  Cardiologist: Sinclair Grooms, MD   Chief Complaint  Patient presents with  . Cardiac Valve Problem    Aortic valve replacement    History of Present Illness:  [name] [name] is a 66 y.o. male who presents for bicuspid aortic valve with aortic aneurysm status post Bentall procedure, maze procedure for paroxysmal atrial fibrillation, obesity, obstructive sleep apnea, and hypertension.  His wife states that he seems addicted to C Pap. He denies chest discomfort, palpitations, syncope, edema, orthopnea, and PND. He cannot remember when his aortic valve surgery occurred (it was in 2008). No transient neurological complaints.  Past Medical History:  Diagnosis Date  . Bicuspid aortic valve    Bentall procedure 2008  . Heart attack   . Hypertension     Past Surgical History:  Procedure Laterality Date  . CARDIAC VALVE REPLACEMENT     Bentall procedure, 2008  . VASECTOMY      Current Medications: Outpatient Medications Prior to Visit  Medication Sig Dispense Refill  . aspirin 81 MG tablet Take 81 mg by mouth daily.    . Multiple Vitamin (MULTIVITAMIN) tablet Take 1 tablet by mouth daily.    Marland Kitchen OVER THE COUNTER MEDICATION Take 2 tablets by mouth at bedtime. Med Name: SLEEP THRU    . amlodipine-atorvastatin (CADUET) 10-20 MG tablet TAKE 1 BY MOUTH DAILY  "OFFICE VISIT NEEDED FOR REFILLS" 90 tablet 2  . metoprolol succinate (TOPROL-XL) 50 MG 24 hr tablet TAKE 1 BY MOUTH DAILY- TISHIRA BREWINGTON  "OFFICE VISIT NEEDED FOR REFILLS" 90 tablet 2  . azithromycin (ZITHROMAX) 250 MG tablet Start with 2 tablets today, then 1 daily thereafter. (Patient not taking: Reported on 07/01/2016) 6 tablet 0  . docusate sodium (COLACE) 50 MG capsule Take 1 capsule (50 mg total) by mouth 2 (two) times daily. (Patient not taking: Reported on 08/16/2016) 10 capsule 0  .  HYDROcodone-homatropine (HYCODAN) 5-1.5 MG/5ML syrup Take 5 mLs by mouth at bedtime as needed. (Patient not taking: Reported on 07/01/2016) 100 mL 0  . OVER THE COUNTER MEDICATION Take 2 tablets by mouth daily. Med Name: OSTEO BI-FLEX    . OVER THE COUNTER MEDICATION Take 2 tablets by mouth daily. Med Name: ADRENAL HEALTH     No facility-administered medications prior to visit.      Allergies:   Amiodarone   Social History   Social History  . Marital status: Married    Spouse name: Dori  . Number of children: 2  . Years of education: College   Social History Main Topics  . Smoking status: Former Smoker    Quit date: 10/04/2003  . Smokeless tobacco: Never Used  . Alcohol use 0.0 oz/week     Comment: 1-2 glass per month  . Drug use: No  . Sexual activity: Not Asked   Other Topics Concern  . 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   Patient has two children.   Patient works at [employer].   Patient drinks two cups of coffee daily.   Patient has a college education.     Family History:  The patient's family history includes Cancer in his father, maternal grandfather, and maternal grandmother; Stroke in his paternal grandfather.   ROS:   Please see the history of present  illness.    Sleeps with C Pap. Dyspnea on exertion. No chest pain.  All other systems reviewed and are negative.   PHYSICAL EXAM:   VS:  BP (!) 140/92 (BP Location: Left Arm)   Pulse 64   Ht 5\' 10"  (1.778 m)   Wt (!) 314 lb (142.4 kg)   BMI 45.05 kg/m    GEN: Well nourished, well developed, in no acute distress  HEENT: normal  Neck: no JVD, carotid bruits, or masses Cardiac: RRR; 2/6 systolic  Murmur. There are no rubs, gallops, or edema.  Respiratory:  clear to auscultation bilaterally, normal work of breathing GI: soft, nontender, nondistended, + BS MS: no deformity or atrophy  Skin: warm and dry, no rash Neuro:  Alert and Oriented x 3, Strength and  sensation are intact Psych: euthymic mood, full affect  Wt Readings from Last 3 Encounters:  08/16/16 (!) 314 lb (142.4 kg)  07/01/16 (!) 311 lb (141.1 kg)  06/23/16 (!) 315 lb (142.9 kg)      Studies/Labs Reviewed:   EKG:  EKG  Normal sinus rhythm with poor R wave progression.  Recent Labs: 06/23/2016: BNP CANCELED; BNP 17.7; Hemoglobin 14.7   Lipid Panel    Component Value Date/Time   CHOL 175 08/07/2014 1650   TRIG 120 08/07/2014 1650   HDL 51 08/07/2014 1650   CHOLHDL 3.4 08/07/2014 1650   VLDL 24 08/07/2014 1650   LDLCALC 100 (H) 08/07/2014 1650    Additional studies/ records that were reviewed today include:   2-D ECHOCARDIOGRAM, February 2017: ------------------------------------------------------------------- Study Conclusions  - Left ventricle: The cavity size was normal. There was moderate   concentric hypertrophy. Systolic function was normal. The   estimated ejection fraction was in the range of 60% to 65%. Wall   motion was normal; there were no regional wall motion   abnormalities. Features are consistent with a pseudonormal left   ventricular filling pattern, with concomitant abnormal relaxation   and increased filling pressure (grade 2 diastolic dysfunction). - Ventricular septum: Septal motion showed paradox. - Aortic valve: A bioprosthesis was present and functioning   normally. - Aorta: S/P graft repair of the ascending aorta. - Left atrium: The atrium was mildly dilated.  Impressions:  - Prosthetic valve gradients are similar to 2011.   ASSESSMENT:    1. S/P aortic valve replacement with bioprosthetic valve   2. DOE (dyspnea on exertion)   3. Essential hypertension   4. Morbid obesity ([place])   5. PAF (paroxysmal atrial fibrillation) ([place])   6. HYPERLIPIDEMIA, MIXED   7. S/P ascending aortic aneurysm repair      PLAN:  In order of problems listed above:  1. Clinically there is no evidence of valve dysfunction. Compared to findings  on echo from 1 year ago, the exam suggests no significant change. 2. Probably related to deconditioning. Rule out CAD with exercise treadmill test. 3. Borderline blood pressure control. Recommended decrease calories, aerobic activity, 2 g sodium diet. The exercise treadmill test also help determine if blood pressure control is reasonable. 4. This remains perhaps his most significant medical issue. We discussed decreased caloric intake and aerobic activity. This problem is a risk factor for diabetes, arthritis, and obesity related heart disease. 5. Continued to clinically monitor for evidence of arrhythmia. Palpitation should be reported immediately. 6. Target LDL less than 100.  Overall plan is here over year risk factor modification. No evidence of valve dysfunction. Because of dyspnea on exertion, exercise treadmill testing will be done  to exclude the possibility of anginal equivalent.  Medication Adjustments/Labs and Tests Ordered: Current medicines are reviewed at length with the patient today.  Concerns regarding medicines are outlined above.  Medication changes, Labs and Tests ordered today are listed in the Patient Instructions below. Patient Instructions  Medication Instructions:  None  Labwork: None  Testing/Procedures: Your physician has requested that you have an exercise tolerance test. For further information please visit [email or web address]. Please also follow instruction sheet, as given.   Follow-Up: Your physician wants you to follow-up in: 1 year with Dr. Tamala Julian. You will receive a reminder letter in the mail two months in advance. If you don't receive a letter, please call our office to schedule the follow-up appointment.   Any Other Special Instructions Will Be Listed Below (If Applicable).     If you need a refill on your cardiac medications before your next appointment, please call your pharmacy.      Signed, Sinclair Grooms, MD  08/16/2016 9:06 AM    [facility] [address], [place], [place]  [address] Phone: [phone]; Fax: [phone]

## 2016-08-16 ENCOUNTER — Encounter: Payer: Self-pay | Admitting: Interventional Cardiology

## 2016-08-16 ENCOUNTER — Ambulatory Visit (INDEPENDENT_AMBULATORY_CARE_PROVIDER_SITE_OTHER): Payer: BLUE CROSS/BLUE SHIELD | Admitting: Interventional Cardiology

## 2016-08-16 VITALS — BP 140/92 | HR 64 | Ht 70.0 in | Wt 314.0 lb

## 2016-08-16 DIAGNOSIS — R0600 Dyspnea, unspecified: Secondary | ICD-10-CM

## 2016-08-16 DIAGNOSIS — Z953 Presence of xenogenic heart valve: Secondary | ICD-10-CM

## 2016-08-16 DIAGNOSIS — I10 Essential (primary) hypertension: Secondary | ICD-10-CM | POA: Diagnosis not present

## 2016-08-16 DIAGNOSIS — Z9889 Other specified postprocedural states: Secondary | ICD-10-CM

## 2016-08-16 DIAGNOSIS — I480 Paroxysmal atrial fibrillation: Secondary | ICD-10-CM | POA: Diagnosis not present

## 2016-08-16 DIAGNOSIS — R0609 Other forms of dyspnea: Secondary | ICD-10-CM | POA: Diagnosis not present

## 2016-08-16 DIAGNOSIS — E782 Mixed hyperlipidemia: Secondary | ICD-10-CM

## 2016-08-16 DIAGNOSIS — Z8679 Personal history of other diseases of the circulatory system: Secondary | ICD-10-CM

## 2016-08-16 MED ORDER — METOPROLOL SUCCINATE ER 50 MG PO TB24: 50.0000 mg | ORAL_TABLET | Freq: Every day | ORAL | 3 refills | Status: DC

## 2016-08-16 MED ORDER — AMLODIPINE-ATORVASTATIN 10-20 MG PO TABS: 1.0000 | ORAL_TABLET | Freq: Every day | ORAL | 3 refills | Status: DC

## 2016-08-16 NOTE — Patient Instructions (Signed)
Medication Instructions:  None  Labwork: None  Testing/Procedures: Your physician has requested that you have an exercise tolerance test. For further information please visit [email or web address]. Please also follow instruction sheet, as given.   Follow-Up: Your physician wants you to follow-up in: 1 year with Dr. Tamala Julian. You will receive a reminder letter in the mail two months in advance. If you don't receive a letter, please call our office to schedule the follow-up appointment.   Any Other Special Instructions Will Be Listed Below (If Applicable).     If you need a refill on your cardiac medications before your next appointment, please call your pharmacy.

## 2016-09-17 DIAGNOSIS — M170 Bilateral primary osteoarthritis of knee: Secondary | ICD-10-CM | POA: Diagnosis not present

## 2016-11-08 ENCOUNTER — Ambulatory Visit (INDEPENDENT_AMBULATORY_CARE_PROVIDER_SITE_OTHER): Payer: BLUE CROSS/BLUE SHIELD | Admitting: Urgent Care

## 2016-11-08 ENCOUNTER — Encounter: Payer: Self-pay | Admitting: Urgent Care

## 2016-11-08 ENCOUNTER — Ambulatory Visit (INDEPENDENT_AMBULATORY_CARE_PROVIDER_SITE_OTHER): Payer: BLUE CROSS/BLUE SHIELD

## 2016-11-08 VITALS — BP 154/86 | HR 82 | Temp 97.9°F | Resp 18 | Ht 70.0 in | Wt 298.0 lb

## 2016-11-08 DIAGNOSIS — M62838 Other muscle spasm: Secondary | ICD-10-CM

## 2016-11-08 DIAGNOSIS — M542 Cervicalgia: Secondary | ICD-10-CM | POA: Diagnosis not present

## 2016-11-08 MED ORDER — METHYLPREDNISOLONE ACETATE 80 MG/ML IJ SUSP
80.0000 mg | Freq: Once | INTRAMUSCULAR | Status: AC
  Administered 2016-11-08: 80 mg via INTRAMUSCULAR

## 2016-11-08 MED ORDER — CYCLOBENZAPRINE HCL 5 MG PO TABS: 5.0000 mg | ORAL_TABLET | Freq: Three times a day (TID) | ORAL | 1 refills | Status: DC | PRN

## 2016-11-08 NOTE — Progress Notes (Signed)
    MRN: [id number] DOB: 05-21-1951  Subjective:   [name] [name] is a 66 y.o. male presenting for chief complaint of Neck Pain (x1 week; states it feels like spasms)  Reports 1 week history of neck pain, spasms, tightness. Pain is sharp, constant, worsening, aggravated by neck bending. Patient has not slept well due to being in the hospital for his wife, having to take extra responsibilities for his wife, home . Has tried heat, ibuprofen, APAP, neck stretching and rolling. Has also tried deep tissue massage. Denies falls, trauma, radiculopathy, weakness.  Tryce has a current medication list which includes the following prescription(s): amlodipine-atorvastatin, aspirin, metoprolol succinate, multivitamin, OVER THE COUNTER MEDICATION, and turmeric. Also is allergic to amiodarone. Boyd  has a past medical history of Bicuspid aortic valve; Heart attack ([place]); and Hypertension. Also  has a past surgical history that includes Vasectomy and Cardiac valve replacement.  Objective:   Vitals: BP (!) 154/86   Pulse 82   Temp 97.9 F (36.6 C) (Oral)   Resp 18   Ht 5\' 10"  (1.778 m)   Wt 298 lb (135.2 kg)   SpO2 96%   BMI 42.76 kg/m   Physical Exam  Constitutional: He is oriented to person, place, and time. He appears well-developed and well-nourished.  Cardiovascular: Normal rate.   Pulmonary/Chest: Effort normal.  Musculoskeletal:       Cervical back: He exhibits decreased range of motion (extension, lateral flexion to the right), tenderness and spasm (over area depicted). He exhibits no bony tenderness, no swelling, no edema, no deformity, no laceration and no pain.       Back:  Neurological: He is alert and oriented to person, place, and time.   No results found.   Assessment and Plan :   1. Neck pain 2. Muscle spasms of neck 3. Trapezius muscle spasm - Radiology over-read pending. Will use IM Depo-Medrol today. Start Flexeril. RTC in 1 week if no improvement.  [name],  PA-C [facility] [phone] 11/08/2016  5:34 PM

## 2016-11-08 NOTE — Patient Instructions (Addendum)
Muscle Cramps and Spasms Muscle cramps and spasms occur when a muscle or muscles tighten and you have no control over this tightening (involuntary muscle contraction). They are a common problem and can develop in any muscle. The most common place is in the calf muscles of the leg. Muscle cramps and muscle spasms are both involuntary muscle contractions, but there are some differences between the two:  Muscle cramps are painful. They come and go and may last a few seconds to 15 minutes. Muscle cramps are often more forceful and last longer than muscle spasms.  Muscle spasms may or may not be painful. They may also last just a few seconds or much longer.  Certain medical conditions, such as diabetes or Parkinson disease, can make it more likely to develop cramps or spasms. However, cramps or spasms are usually not caused by a serious underlying problem. Common causes include:  Overexertion.  Overuse from repetitive motions, or doing the same thing over and over.  Remaining in a certain position for a long period of time.  Improper preparation, form, or technique while playing a sport or doing an activity.  Dehydration.  Injury.  Side effects of some medicines.  Abnormally low levels of the salts and ions in your blood (electrolytes), especially potassium and calcium. This could happen if you are taking water pills (diuretics) or if you are pregnant.  In many cases, the cause of muscle cramps or spasms is unknown. Follow these instructions at home:  Stay well hydrated. Drink enough fluid to keep your urine clear or pale yellow.  Try massaging, stretching, and relaxing the affected muscle.  If directed, apply heat to tight or tense muscles as often as told by your health care provider. Use the heat source that your health care provider recommends, such as a moist heat pack or a heating pad. ? Place a towel between your skin and the heat source. ? Leave the heat on for 20-30  minutes. ? Remove the heat if your skin turns bright red. This is especially important if you are unable to feel pain, heat, or cold. You may have a greater risk of getting burned.  If directed, put ice on the affected area. This may help if you are sore or have pain after a cramp or spasm. ? Put ice in a plastic bag. ? Place a towel between your skin and the bag. ? Leavethe ice on for 20 minutes, 2-3 times a day.  Take over-the-counter and prescription medicines only as told by your health care provider.  Pay attention to any changes in your symptoms. Contact a health care provider if:  Your cramps or spasms get more severe or happen more often.  Your cramps or spasms do not improve over time. This information is not intended to replace advice given to you by your health care provider. Make sure you discuss any questions you have with your health care provider. Document Released: 11/06/2001 Document Revised: 06/18/2015 Document Reviewed: 02/18/2015 Elsevier Interactive Patient Education  2018 [employer].     IF you received an x-ray today, you will receive an invoice from [facility]. Please contact [facility] at [phone] with questions or concerns regarding your invoice.   IF you received labwork today, you will receive an invoice from [facility]. Please contact [facility] at [phone] with questions or concerns regarding your invoice.   Our billing staff will not be able to assist you with questions regarding bills from these companies.  You will be contacted  with the lab results as soon as they are available. The fastest way to get your results is to activate your My Chart account. Instructions are located on the last page of this paperwork. If you have not heard from [place] regarding the results in 2 weeks, please contact this office.

## 2016-11-11 DIAGNOSIS — G4733 Obstructive sleep apnea (adult) (pediatric): Secondary | ICD-10-CM | POA: Diagnosis not present

## 2016-12-14 DIAGNOSIS — R7301 Impaired fasting glucose: Secondary | ICD-10-CM | POA: Diagnosis not present

## 2016-12-14 DIAGNOSIS — E782 Mixed hyperlipidemia: Secondary | ICD-10-CM | POA: Diagnosis not present

## 2016-12-14 DIAGNOSIS — Z0000 Encounter for general adult medical examination without abnormal findings: Secondary | ICD-10-CM | POA: Diagnosis not present

## 2016-12-14 DIAGNOSIS — Z79899 Other long term (current) drug therapy: Secondary | ICD-10-CM | POA: Diagnosis not present

## 2016-12-14 DIAGNOSIS — Z1211 Encounter for screening for malignant neoplasm of colon: Secondary | ICD-10-CM | POA: Diagnosis not present

## 2016-12-14 DIAGNOSIS — Z23 Encounter for immunization: Secondary | ICD-10-CM | POA: Diagnosis not present

## 2016-12-30 DIAGNOSIS — E119 Type 2 diabetes mellitus without complications: Secondary | ICD-10-CM | POA: Diagnosis not present

## 2017-01-14 DIAGNOSIS — M1711 Unilateral primary osteoarthritis, right knee: Secondary | ICD-10-CM | POA: Diagnosis not present

## 2017-01-14 DIAGNOSIS — M1712 Unilateral primary osteoarthritis, left knee: Secondary | ICD-10-CM | POA: Diagnosis not present

## 2017-01-14 DIAGNOSIS — M170 Bilateral primary osteoarthritis of knee: Secondary | ICD-10-CM | POA: Diagnosis not present

## 2017-01-27 ENCOUNTER — Ambulatory Visit: Payer: BLUE CROSS/BLUE SHIELD

## 2017-02-03 ENCOUNTER — Ambulatory Visit: Payer: BLUE CROSS/BLUE SHIELD

## 2017-02-10 ENCOUNTER — Ambulatory Visit: Payer: BLUE CROSS/BLUE SHIELD

## 2017-02-17 ENCOUNTER — Ambulatory Visit: Payer: BLUE CROSS/BLUE SHIELD

## 2017-02-23 DIAGNOSIS — R635 Abnormal weight gain: Secondary | ICD-10-CM | POA: Diagnosis not present

## 2017-02-24 ENCOUNTER — Ambulatory Visit: Payer: BLUE CROSS/BLUE SHIELD

## 2017-03-03 ENCOUNTER — Ambulatory Visit: Payer: BLUE CROSS/BLUE SHIELD

## 2017-03-03 DIAGNOSIS — Z6841 Body Mass Index (BMI) 40.0 and over, adult: Secondary | ICD-10-CM | POA: Diagnosis not present

## 2017-03-03 DIAGNOSIS — R635 Abnormal weight gain: Secondary | ICD-10-CM | POA: Diagnosis not present

## 2017-03-03 DIAGNOSIS — E669 Obesity, unspecified: Secondary | ICD-10-CM | POA: Diagnosis not present

## 2017-03-03 DIAGNOSIS — E119 Type 2 diabetes mellitus without complications: Secondary | ICD-10-CM | POA: Diagnosis not present

## 2017-03-16 DIAGNOSIS — M170 Bilateral primary osteoarthritis of knee: Secondary | ICD-10-CM | POA: Diagnosis not present

## 2017-03-24 DIAGNOSIS — M170 Bilateral primary osteoarthritis of knee: Secondary | ICD-10-CM | POA: Diagnosis not present

## 2017-03-31 DIAGNOSIS — M170 Bilateral primary osteoarthritis of knee: Secondary | ICD-10-CM | POA: Diagnosis not present

## 2017-04-08 DIAGNOSIS — E119 Type 2 diabetes mellitus without complications: Secondary | ICD-10-CM | POA: Diagnosis not present

## 2017-04-08 DIAGNOSIS — E1165 Type 2 diabetes mellitus with hyperglycemia: Secondary | ICD-10-CM | POA: Diagnosis not present

## 2017-04-08 DIAGNOSIS — Z23 Encounter for immunization: Secondary | ICD-10-CM | POA: Diagnosis not present

## 2017-05-06 DIAGNOSIS — G4733 Obstructive sleep apnea (adult) (pediatric): Secondary | ICD-10-CM | POA: Diagnosis not present

## 2017-06-17 DIAGNOSIS — M1712 Unilateral primary osteoarthritis, left knee: Secondary | ICD-10-CM | POA: Diagnosis not present

## 2017-06-17 DIAGNOSIS — M25561 Pain in right knee: Secondary | ICD-10-CM | POA: Diagnosis not present

## 2017-06-17 DIAGNOSIS — M1711 Unilateral primary osteoarthritis, right knee: Secondary | ICD-10-CM | POA: Diagnosis not present

## 2017-06-17 DIAGNOSIS — M25562 Pain in left knee: Secondary | ICD-10-CM | POA: Diagnosis not present

## 2017-06-17 DIAGNOSIS — M1710 Unilateral primary osteoarthritis, unspecified knee: Secondary | ICD-10-CM | POA: Insufficient documentation

## 2017-06-17 DIAGNOSIS — M179 Osteoarthritis of knee, unspecified: Secondary | ICD-10-CM | POA: Insufficient documentation

## 2017-08-12 ENCOUNTER — Ambulatory Visit: Payer: BLUE CROSS/BLUE SHIELD | Admitting: Urgent Care

## 2017-08-12 ENCOUNTER — Encounter: Payer: Self-pay | Admitting: Urgent Care

## 2017-08-12 ENCOUNTER — Other Ambulatory Visit: Payer: Self-pay

## 2017-08-12 VITALS — BP 138/80 | HR 65 | Temp 98.2°F | Resp 18 | Ht 70.0 in | Wt 251.6 lb

## 2017-08-12 DIAGNOSIS — J0100 Acute maxillary sinusitis, unspecified: Secondary | ICD-10-CM

## 2017-08-12 DIAGNOSIS — R51 Headache: Secondary | ICD-10-CM | POA: Diagnosis not present

## 2017-08-12 DIAGNOSIS — R519 Headache, unspecified: Secondary | ICD-10-CM

## 2017-08-12 MED ORDER — AMOXICILLIN-POT CLAVULANATE 875-125 MG PO TABS: 1.0000 | ORAL_TABLET | Freq: Two times a day (BID) | ORAL | 0 refills | Status: DC

## 2017-08-12 MED ORDER — CETIRIZINE HCL 10 MG PO TABS: 10.0000 mg | ORAL_TABLET | Freq: Every day | ORAL | 11 refills | Status: DC

## 2017-08-12 NOTE — Patient Instructions (Addendum)
Sinusitis, Adult Sinusitis is soreness and inflammation of your sinuses. Sinuses are hollow spaces in the bones around your face. They are located:  Around your eyes.  In the middle of your forehead.  Behind your nose.  In your cheekbones.  Your sinuses and nasal passages are lined with a stringy fluid (mucus). Mucus normally drains out of your sinuses. When your nasal tissues get inflamed or swollen, the mucus can get trapped or blocked so air cannot flow through your sinuses. This lets bacteria, viruses, and funguses grow, and that leads to infection. Follow these instructions at home: Medicines  Take, use, or apply over-the-counter and prescription medicines only as told by your doctor. These may include nasal sprays.  If you were prescribed an antibiotic medicine, take it as told by your doctor. Do not stop taking the antibiotic even if you start to feel better. Hydrate and Humidify  Drink enough water to keep your pee (urine) clear or pale yellow.  Use a cool mist humidifier to keep the humidity level in your home above 50%.  Breathe in steam for 10-15 minutes, 3-4 times a day or as told by your doctor. You can do this in the bathroom while a hot shower is running.  Try not to spend time in cool or dry air. Rest  Rest as much as possible.  Sleep with your head raised (elevated).  Make sure to get enough sleep each night. General instructions  Put a warm, moist washcloth on your face 3-4 times a day or as told by your doctor. This will help with discomfort.  Wash your hands often with soap and water. If there is no soap and water, use hand sanitizer.  Do not smoke. Avoid being around people who are smoking (secondhand smoke).  Keep all follow-up visits as told by your doctor. This is important. Contact a doctor if:  You have a fever.  Your symptoms get worse.  Your symptoms do not get better within 10 days. Get help right away if:  You have a very bad  headache.  You cannot stop throwing up (vomiting).  You have pain or swelling around your face or eyes.  You have trouble seeing.  You feel confused.  Your neck is stiff.  You have trouble breathing. This information is not intended to replace advice given to you by your health care provider. Make sure you discuss any questions you have with your health care provider. Document Released: 11/03/2007 Document Revised: 01/11/2016 Document Reviewed: 03/12/2015 Elsevier Interactive Patient Education  2018 Elsevier Inc.     IF you received an x-ray today, you will receive an invoice from [facility]. Please contact  Radiology at [phone] with questions or concerns regarding your invoice.   IF you received labwork today, you will receive an invoice from [facility]. Please contact [facility] at [phone] with questions or concerns regarding your invoice.   Our billing staff will not be able to assist you with questions regarding bills from these companies.  You will be contacted with the lab results as soon as they are available. The fastest way to get your results is to activate your My Chart account. Instructions are located on the last page of this paperwork. If you have not heard from us regarding the results in 2 weeks, please contact this office.    ' 

## 2017-08-12 NOTE — Progress Notes (Signed)
   MRN: [id number] DOB: 10-29-1950  Subjective:   [name] [name] is a 67 y.o. male presenting for 3 week history of sinus congestion, now having left sided sinus pain, left facial pain and ear pain. Denies fever, sore throat, cough. Has used otc nasal decongestant, antihistamine and Afrin (3 uses). Denies smoking cigarettes. Patient has lost ~60lbs through a [facility] weight loss program.  [name] [name] has a current medication list which includes the following prescription(s): metoprolol succinate. Also is allergic to amiodarone and amlodipine.  [name] [name]  has a past medical history of Bicuspid aortic valve, Heart attack ([place]), and Hypertension. Also  has a past surgical history that includes Vasectomy and Cardiac valve replacement.  Objective:   Vitals: BP 138/80   Pulse 65   Temp 98.2 F (36.8 C) (Oral)   Resp 18   Ht 5\' 10"  (1.778 m)   Wt 251 lb 9.6 oz (114.1 kg)   SpO2 97%   BMI 36.10 kg/m   Wt Readings from Last 3 Encounters:  08/12/17 251 lb 9.6 oz (114.1 kg)  11/08/16 298 lb (135.2 kg)  08/16/16 (!) 314 lb (142.4 kg)    Physical Exam  Constitutional: He is oriented to person, place, and time. He appears well-developed and well-nourished.  HENT:  Right Ear: Tympanic membrane normal.  Left Ear: Tympanic membrane normal.  Nose: Left sinus exhibits maxillary sinus tenderness.  Mouth/Throat: Oropharynx is clear and moist.  Eyes: Right eye exhibits no discharge. Left eye exhibits no discharge. No scleral icterus.  Neck: Normal range of motion. Neck supple.  Left-sided lymph node tenderness.  Cardiovascular: Normal rate.  Pulmonary/Chest: Effort normal.  Lymphadenopathy:    He has no cervical adenopathy.  Neurological: He is alert and oriented to person, place, and time.  Skin: Skin is warm and dry.  Psychiatric: He has a normal mood and affect.   Assessment and Plan :   Acute non-recurrent maxillary sinusitis  Facial pain  Start Augmentin, use supportive care otherwise.  Return-to-clinic precautions discussed, patient verbalized understanding.   [name], PA-C [facility] [phone] 08/12/2017  9:34 AM

## 2017-08-18 ENCOUNTER — Encounter: Payer: Self-pay | Admitting: Interventional Cardiology

## 2017-08-24 ENCOUNTER — Other Ambulatory Visit: Payer: Self-pay | Admitting: Interventional Cardiology

## 2017-08-25 ENCOUNTER — Other Ambulatory Visit: Payer: Self-pay | Admitting: Interventional Cardiology

## 2017-08-28 NOTE — Progress Notes (Signed)
Cardiology Office Note    Date:  08/29/2017   ID:  [name] [name], DOB 09-Jul-1950, MRN [id number]  PCP:  [name] Jordan, MD  Cardiologist: [name] Grooms, MD   Chief Complaint  Patient presents with  . Thoracic Aortic Aneurysm  . Cardiac Valve Problem    History of Present Illness:  [name] [name] is a 67 y.o. male presents for bicuspid aortic valve with aortic aneurysm status post Bentall procedure 2008, maze procedure for paroxysmal atrial fibrillation, obesity, obstructive sleep apnea, and hypertension.  [name] [name] was diagnosed with diabetes since the last office visit a year ago.  He got on a serious diet at [facility] and has lost 70 pounds.  Hemoglobin A1c and average glucose values have been markedly improved.  He has not required therapy.  He has no cardiopulmonary complaints and specifically denies dyspnea, orthopnea, palpitations, edema, and chest pain.   Past Medical History:  Diagnosis Date  . Bicuspid aortic valve    Bentall procedure 2008  . Heart attack ([place])   . Hypertension     Past Surgical History:  Procedure Laterality Date  . CARDIAC VALVE REPLACEMENT     Bentall procedure, 2008  . VASECTOMY      Current Medications: Outpatient Medications Prior to Visit  Medication Sig Dispense Refill  . amlodipine-atorvastatin (CADUET) 10-20 MG tablet Take 1 tablet by mouth daily.    . metoprolol succinate (TOPROL-XL) 50 MG 24 hr tablet TAKE 1 TABLET BY MOUTH DAILY GENERIC EQUIVALENT FOR TOPROL-XL 30 tablet 0  . amoxicillin-clavulanate (AUGMENTIN) 875-125 MG tablet Take 1 tablet by mouth 2 (two) times daily. (Patient not taking: Reported on 08/29/2017) 20 tablet 0  . cetirizine (ZYRTEC) 10 MG tablet Take 1 tablet (10 mg total) by mouth daily. (Patient not taking: Reported on 08/29/2017) 30 tablet 11   No facility-administered medications prior to visit.      Allergies:   Amiodarone and Amlodipine   Social History   Socioeconomic History  . Marital status: Married   Spouse name: Dori  . Number of children: 2  . Years of education: College  . Highest education level: Not on file  Occupational History  . Not on file  Social Needs  . Financial resource strain: Not on file  . Food insecurity:    Worry: Not on file    Inability: Not on file  . Transportation needs:    Medical: Not on file    Non-medical: Not on file  Tobacco Use  . Smoking status: Former Smoker    Last attempt to quit: 10/04/2003    Years since quitting: 13.9  . Smokeless tobacco: Never Used  Substance and Sexual Activity  . Alcohol use: Yes    Alcohol/week: 0.0 oz    Comment: 1-2 glass per month  . Drug use: No  . Sexual activity: Not on file  Lifestyle  . Physical activity:    Days per week: Not on file    Minutes per session: Not on file  . Stress: Not on file  Relationships  . Social connections:    Talks on phone: Not on file    Gets together: Not on file    Attends religious service: Not on file    Active member of club or organization: Not on file    Attends meetings of clubs or organizations: Not on file    Relationship status: Not on file  Other Topics Concern  . 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   Patient has two children.   Patient works at [employer].   Patient drinks two cups of coffee daily.   Patient has a college education.     Family History:  The patient's family history includes Atrial fibrillation in his unknown relative; Cancer in his father, maternal grandfather, and maternal grandmother; Kidney disease in his unknown relative; Stroke in his paternal grandfather.   ROS:   Please see the history of present illness.    Bilateral knee discomfort, otherwise no complaints. All other systems reviewed and are negative.   PHYSICAL EXAM:   VS:  BP 136/84   Pulse 65   Ht 5\' 10"  (1.778 m)   Wt 246 lb 6.4 oz (111.8 kg)   BMI 35.35 kg/m    GEN: Well nourished, well developed, in no acute  distress  HEENT: normal  Neck: no JVD, carotid bruits, or masses Cardiac: RRR; with soft 1/6 systolic murmur without diastolic murmur audible.  No rubs, or gallops,no edema. Respiratory:  clear to auscultation bilaterally, normal work of breathing GI: soft, nontender, nondistended, + BS MS: no deformity or atrophy  Skin: warm and dry, no rash Neuro:  Alert and Oriented x 3, Strength and sensation are intact Psych: euthymic mood, full affect  Wt Readings from Last 3 Encounters:  08/29/17 246 lb 6.4 oz (111.8 kg)  08/12/17 251 lb 9.6 oz (114.1 kg)  11/08/16 298 lb (135.2 kg)      Studies/Labs Reviewed:   EKG:  EKG sinus rhythm, PAC, poor R wave progression V1 through V4, prominent voltage, and when compared to March 2018, no significant change has occurred with the exception of the isolated PAC.  Recent Labs: No results found for requested labs within last 8760 hours.   Lipid Panel    Component Value Date/Time   CHOL 175 08/07/2014 1650   TRIG 120 08/07/2014 1650   HDL 51 08/07/2014 1650   CHOLHDL 3.4 08/07/2014 1650   VLDL 24 08/07/2014 1650   LDLCALC 100 (H) 08/07/2014 1650    Additional studies/ records that were reviewed today include:  Most recent lipid panel was September with LDL approximately 100.  2D Doppler echocardiogram July 21, 2015:  Study Conclusions   - Left ventricle: The cavity size was normal. There was moderate   concentric hypertrophy. Systolic function was normal. The   estimated ejection fraction was in the range of 60% to 65%. Wall   motion was normal; there were no regional wall motion   abnormalities. Features are consistent with a pseudonormal left   ventricular filling pattern, with concomitant abnormal relaxation   and increased filling pressure (grade 2 diastolic dysfunction). - Ventricular septum: Septal motion showed paradox. - Aortic valve: A bioprosthesis was present and functioning   normally. - Aorta: S/P graft repair of the  ascending aorta. - Left atrium: The atrium was mildly dilated.   Impressions:   - Prosthetic valve gradients are similar to 2011.   ASSESSMENT:    1. S/P ascending aortic aneurysm repair   2. S/P aortic valve replacement with bioprosthetic valve   3. PAF (paroxysmal atrial fibrillation) ([place])   4. HYPERLIPIDEMIA, MIXED   5. Essential hypertension      PLAN:  In order of problems listed above:  1. No clinical evidence of dysfunction 2. No clinical evidence of dysfunction.  No significant murmur of either aortic stenosis or regurgitation. 3. No clinical recurrence of atrial fibrillation. 4. Lipid  panel will be requested when he returns to the nutritional specialist who is monitoring blood work on a regular basis. 5. Blood pressure is adequately controlled currently 136/84 mmHg.  No change in current dose of CAD a wet and continue metoprolol succinate at 50 mg/day.  Plan clinical follow-up in 1 year.  No change in current medical regimen.  Consider 2D Doppler echocardiogram on return.  Call if dyspnea, chest pain, or other cardiac symptoms in the interim.  Medication Adjustments/Labs and Tests Ordered: Current medicines are reviewed at length with the patient today.  Concerns regarding medicines are outlined above.  Medication changes, Labs and Tests ordered today are listed in the Patient Instructions below. Patient Instructions  Medication Instructions:  Your physician recommends that you continue on your current medications as directed. Please refer to the Current Medication list given to you today.  Labwork: None  Testing/Procedures: None  Follow-Up: Your physician wants you to follow-up in: 1 year with Dr. Tamala Julian.  You will receive a reminder letter in the mail two months in advance. If you don't receive a letter, please call our office to schedule the follow-up appointment.   Any Other Special Instructions Will Be Listed Below (If Applicable).     If you need a  refill on your cardiac medications before your next appointment, please call your pharmacy.      Signed, [name] Grooms, MD  08/29/2017 5:35 PM    [facility] [address], [place], [place]  [address] Phone: [phone]; Fax: [phone]

## 2017-08-29 ENCOUNTER — Ambulatory Visit: Payer: BLUE CROSS/BLUE SHIELD | Admitting: Interventional Cardiology

## 2017-08-29 ENCOUNTER — Encounter: Payer: Self-pay | Admitting: Interventional Cardiology

## 2017-08-29 VITALS — BP 136/84 | HR 65 | Ht 70.0 in | Wt 246.4 lb

## 2017-08-29 DIAGNOSIS — E782 Mixed hyperlipidemia: Secondary | ICD-10-CM

## 2017-08-29 DIAGNOSIS — Z8679 Personal history of other diseases of the circulatory system: Secondary | ICD-10-CM

## 2017-08-29 DIAGNOSIS — I480 Paroxysmal atrial fibrillation: Secondary | ICD-10-CM | POA: Diagnosis not present

## 2017-08-29 DIAGNOSIS — Z9889 Other specified postprocedural states: Secondary | ICD-10-CM | POA: Diagnosis not present

## 2017-08-29 DIAGNOSIS — Z953 Presence of xenogenic heart valve: Secondary | ICD-10-CM | POA: Diagnosis not present

## 2017-08-29 DIAGNOSIS — I10 Essential (primary) hypertension: Secondary | ICD-10-CM | POA: Diagnosis not present

## 2017-08-29 NOTE — Patient Instructions (Signed)

## 2017-09-02 ENCOUNTER — Other Ambulatory Visit: Payer: Self-pay | Admitting: *Deleted

## 2017-09-02 NOTE — Telephone Encounter (Signed)
Okay to refill? No recent lipid panel in epic and per recent office visit lipid panel will be requested when he returns to the nutritional specialist. Please advise. Thanks, MI

## 2017-09-06 DIAGNOSIS — G4733 Obstructive sleep apnea (adult) (pediatric): Secondary | ICD-10-CM | POA: Diagnosis not present

## 2017-09-06 MED ORDER — AMLODIPINE-ATORVASTATIN 10-20 MG PO TABS: 1.0000 | ORAL_TABLET | Freq: Every day | ORAL | 3 refills | Status: DC

## 2017-09-06 NOTE — Telephone Encounter (Signed)
Ok to fill 

## 2017-09-16 ENCOUNTER — Other Ambulatory Visit: Payer: Self-pay | Admitting: Interventional Cardiology

## 2017-10-18 ENCOUNTER — Encounter: Payer: Self-pay | Admitting: Physician Assistant

## 2017-10-18 ENCOUNTER — Other Ambulatory Visit: Payer: Self-pay

## 2017-10-18 ENCOUNTER — Ambulatory Visit: Payer: BLUE CROSS/BLUE SHIELD | Admitting: Physician Assistant

## 2017-10-18 VITALS — BP 112/70 | HR 72 | Temp 99.2°F | Resp 18 | Ht 66.93 in | Wt 244.2 lb

## 2017-10-18 DIAGNOSIS — E349 Endocrine disorder, unspecified: Secondary | ICD-10-CM

## 2017-10-18 DIAGNOSIS — Z1159 Encounter for screening for other viral diseases: Secondary | ICD-10-CM

## 2017-10-18 DIAGNOSIS — Z1211 Encounter for screening for malignant neoplasm of colon: Secondary | ICD-10-CM | POA: Diagnosis not present

## 2017-10-18 DIAGNOSIS — Z114 Encounter for screening for human immunodeficiency virus [HIV]: Secondary | ICD-10-CM | POA: Diagnosis not present

## 2017-10-18 DIAGNOSIS — D171 Benign lipomatous neoplasm of skin and subcutaneous tissue of trunk: Secondary | ICD-10-CM

## 2017-10-18 DIAGNOSIS — E7800 Pure hypercholesterolemia, unspecified: Secondary | ICD-10-CM | POA: Diagnosis not present

## 2017-10-18 DIAGNOSIS — I10 Essential (primary) hypertension: Secondary | ICD-10-CM | POA: Diagnosis not present

## 2017-10-18 DIAGNOSIS — G4730 Sleep apnea, unspecified: Secondary | ICD-10-CM | POA: Diagnosis not present

## 2017-10-18 DIAGNOSIS — M85641 Other cyst of bone, right hand: Secondary | ICD-10-CM | POA: Diagnosis not present

## 2017-10-18 NOTE — Patient Instructions (Addendum)
Dr. Drema Dallas Address: [address], [place], [place] [address] Phone: [phone] Clinic: [place] [facility]    IF you received an x-ray today, you will receive an invoice from [facility]. Please contact [facility] at [phone] with questions or concerns regarding your invoice.   IF you received labwork today, you will receive an invoice from [facility]. Please contact [facility] at [phone] with questions or concerns regarding your invoice.   Our billing staff will not be able to assist you with questions regarding bills from these companies.  You will be contacted with the lab results as soon as they are available. The fastest way to get your results is to activate your My Chart account. Instructions are located on the last page of this paperwork. If you have not heard from [place] regarding the results in 2 weeks, please contact this office.

## 2017-10-18 NOTE — Progress Notes (Signed)
[name] [name]  MRN: [id number] DOB: 1950-06-07  PCP: Mancel Bale, PA-C  Chief Complaint  Patient presents with  . Establish Care    Subjective:  Pt presents to clinic to establish care.  He brings in a list that he and his wife have created for discussion of today.  Libido - testosterone was low in past  - he has no libido and rare ability to obtain or maintain an erection - he has never been on viagra in the past - he has been on testosterone but it was gel and there was concern about grandchildren touching him so he stopped treatment  Worried about weight loss and BP medication and need for cholesterol medication - he has lost 74lb in about a year with weight management clinic - he has yogurt in am , protein shake lunch, yogurt in the afternoon, salmon, mushrooms and asparagus for dinner - he has been eating the same thing every day for over 6 months - he is very happy with the results - he started this when he was told he was Diabetic and he did not want to go on medications - his last A1C was 5.7  Bilateral knee OA - he has had gel injections in the past - interested in what else there might be other than knee replacements   Bumps on head, right hand and right back -- he has always had them - they do not bother him - the back one has been [place] to show a lipoma - they d not bother him and he does not want them removed he just wants to make sure they are ok.  History is obtained by patient.  Review of Systems  Respiratory: Negative for cough.   Cardiovascular: Negative for chest pain.  Genitourinary: Negative for dysuria.  Musculoskeletal: Positive for gait problem (2nd to knee pain). Negative for joint swelling.    Patient Active Problem List   Diagnosis Date Noted  . Osteoarthritis of knee 06/17/2017  . DOE (dyspnea on exertion) 08/16/2016  . PAF (paroxysmal atrial fibrillation) ([place]) - controlled after Maze procedure 08/15/2016  . S/P ascending aortic aneurysm repair  03/28/2014  . S/P aortic valve replacement with bioprosthetic valve 03/28/2014  . Hx of maze procedure 03/28/2014  . BMI 38.0-38.9,adult - attending [facility] weight management clinic 11/03/2011  . HYPERLIPIDEMIA, MIXED - on medications 04/06/2007  . Essential hypertension - well controlled on medications 04/06/2007  . HERNIATED LUMBAR DISC 04/06/2007  . Sleep apnea - uses CPAP every night 04/06/2007    Current Outpatient Medications on File Prior to Visit  Medication Sig Dispense Refill  . ALPRAZolam (XANAX) 0.25 MG tablet Take 0.25 mg by mouth. Take 1/2 to 1 tablet twice daily as needed  0  . amlodipine-atorvastatin (CADUET) 10-20 MG tablet Take 1 tablet by mouth daily. 90 tablet 3  . aspirin 325 MG EC tablet Take 325 mg by mouth daily.    . metoprolol succinate (TOPROL-XL) 50 MG 24 hr tablet Take 1 tablet (50 mg total) by mouth daily. 90 tablet 3   No current facility-administered medications on file prior to visit.     Allergies  Allergen Reactions  . Amiodarone Rash  . Amlodipine Rash    Past Medical History:  Diagnosis Date  . Bicuspid aortic valve    Bentall procedure 2008  . Heart attack ([place])   . 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   Patient has two children.   Patient works at [employer].   Patient drinks two cups of coffee daily.   Patient has a college education.   Social History   Tobacco Use  . Smoking status: Former Smoker    Packs/day: 0.30    Years: 25.00    Pack years: 7.50    Last attempt to quit: 10/04/2003    Years since quitting: 14.0  . Smokeless tobacco: Never Used  Substance Use Topics  . Alcohol use: Not Currently    Alcohol/week: 0.0 oz  . Drug use: No   family history includes Atrial fibrillation in his brother and father; COPD in his mother; Cancer in his father, maternal grandfather, and maternal grandmother; Kidney disease in his maternal grandfather and  maternal grandmother; Stroke in his paternal grandfather.     Objective:  BP 112/70   Pulse 72   Temp 99.2 F (37.3 C) (Oral)   Resp 18   Ht 5' 6.93" (1.7 m)   Wt 244 lb 3.2 oz (110.8 kg)   SpO2 97%   BMI 38.33 kg/m  Body mass index is 38.33 kg/m.  Physical Exam  Constitutional: He is oriented to person, place, and time.  HENT:  Head: Normocephalic and atraumatic.    Right Ear: External ear normal.  Left Ear: External ear normal.  Eyes: Conjunctivae are normal.  Neck: Normal range of motion.  Cardiovascular: Normal rate, regular rhythm and normal heart sounds.  No murmur heard. Pulmonary/Chest: Effort normal and breath sounds normal. He has no wheezes.  Musculoskeletal:       Back:       Hands: Neurological: He is alert and oriented to person, place, and time.  Skin: Skin is warm and dry.  Psychiatric: Judgment normal.    Assessment and Plan :  Essential hypertension - Plan: TSH - continue current medication she does not need refills currently - we discussed symptoms of low BP with his weight loss that he plans to continue - he currently has no low blood pressure symptoms but he is aware of what they are and he will contact me  Testosterone insufficiency - Plan: Testosterone - check labs - if low he will need to start treatment - he does not want creams - if he still needs viagra at that time we will start that  Screening for HIV (human immunodeficiency virus) - Plan: HIV antibody  Encounter for hepatitis C screening test for low risk patient - Plan: HCV Ab w Reflex to Quant PCR  Screen for colon cancer - Plan: Cologuard - he had a colonoscopy 10 years ago which was normal - he does not want to do that again  Pure hypercholesterolemia - Plan: Lipid panel - check labs - they were good in 9/18 prior to his major weight loss -   Sleep apnea, unspecified type - continue CPAP  Lipoma of back - pt does not want to do anything about this at this time  Cyst of bone of  right hand - pt is not bothered by this at this time   Windell Hummingbird PA-C  [facility] at [facility] 10/18/2017 5:28 PM

## 2017-10-22 ENCOUNTER — Ambulatory Visit: Payer: BLUE CROSS/BLUE SHIELD

## 2017-10-22 DIAGNOSIS — N529 Male erectile dysfunction, unspecified: Secondary | ICD-10-CM

## 2017-10-22 DIAGNOSIS — Z1159 Encounter for screening for other viral diseases: Secondary | ICD-10-CM

## 2017-10-22 DIAGNOSIS — Z114 Encounter for screening for human immunodeficiency virus [HIV]: Secondary | ICD-10-CM | POA: Diagnosis not present

## 2017-10-22 DIAGNOSIS — E7800 Pure hypercholesterolemia, unspecified: Secondary | ICD-10-CM

## 2017-10-22 DIAGNOSIS — E349 Endocrine disorder, unspecified: Secondary | ICD-10-CM

## 2017-10-22 DIAGNOSIS — I10 Essential (primary) hypertension: Secondary | ICD-10-CM | POA: Diagnosis not present

## 2017-10-23 LAB — HCV INTERPRETATION

## 2017-10-23 LAB — LIPID PANEL
Chol/HDL Ratio: 2.6 ratio (ref 0.0–5.0)
Cholesterol, Total: 128 mg/dL (ref 100–199)
HDL: 50 mg/dL (ref >39)
LDL Calculated: 67 mg/dL (ref 0–99)
TRIGLYCERIDES: 53 mg/dL (ref 0–149)
VLDL Cholesterol Cal: 11 mg/dL (ref 5–40)

## 2017-10-23 LAB — TESTOSTERONE: Testosterone: 477 ng/dL (ref 264–916)

## 2017-10-23 LAB — HIV ANTIBODY (ROUTINE TESTING W REFLEX): HIV SCREEN 4TH GENERATION: NONREACTIVE

## 2017-10-23 LAB — HCV AB W REFLEX TO QUANT PCR: HCV Ab: <0.1 s/co ratio (ref 0.0–0.9)

## 2017-10-23 LAB — TSH: TSH: 0.96 u[IU]/mL (ref 0.450–4.500)

## 2017-10-25 MED ORDER — SILDENAFIL CITRATE 100 MG PO TABS
50.0000 mg | ORAL_TABLET | Freq: Every day | ORAL | 11 refills | Status: AC | PRN
Start: 2017-10-25 — End: ?

## 2017-10-25 NOTE — Addendum Note (Signed)
Addended by: Windell Hummingbird L on: 10/25/2017 01:00 PM   Modules accepted: Orders

## 2017-11-10 ENCOUNTER — Encounter: Payer: Self-pay | Admitting: Physician Assistant

## 2017-11-30 ENCOUNTER — Encounter: Payer: Self-pay | Admitting: Urgent Care

## 2017-11-30 ENCOUNTER — Ambulatory Visit: Payer: BLUE CROSS/BLUE SHIELD | Admitting: Urgent Care

## 2017-11-30 VITALS — BP 134/78 | HR 71 | Temp 99.0°F | Resp 17 | Ht 66.0 in | Wt 244.0 lb

## 2017-11-30 DIAGNOSIS — R0981 Nasal congestion: Secondary | ICD-10-CM

## 2017-11-30 DIAGNOSIS — J069 Acute upper respiratory infection, unspecified: Secondary | ICD-10-CM

## 2017-11-30 DIAGNOSIS — B9789 Other viral agents as the cause of diseases classified elsewhere: Secondary | ICD-10-CM | POA: Diagnosis not present

## 2017-11-30 DIAGNOSIS — R070 Pain in throat: Secondary | ICD-10-CM | POA: Diagnosis not present

## 2017-11-30 MED ORDER — PSEUDOEPHEDRINE HCL 60 MG PO TABS: 60.0000 mg | ORAL_TABLET | Freq: Three times a day (TID) | ORAL | 0 refills | Status: DC | PRN

## 2017-11-30 MED ORDER — HYDROCODONE-HOMATROPINE 5-1.5 MG/5ML PO SYRP: 5.0000 mL | ORAL_SOLUTION | Freq: Every evening | ORAL | 0 refills | Status: DC | PRN

## 2017-11-30 NOTE — Progress Notes (Signed)
   MRN: [id number] DOB: [birth year]  Subjective:   [name] [name] is a 67 y.o. male presenting for 1 week history of sinus congestion, productive cough, throat pain. Last episode of sinusitis was 08/12/2017, resolved with Augmentin. Has tried Zyrtec, throat lozenges. Denies smoking cigarettes.  Denies fever, sinus pain, ear pain, chest pain, wheezing.  [name] [name] has a current medication list which includes the following prescription(s): alprazolam, amlodipine-atorvastatin, aspirin, metoprolol succinate, and sildenafil. Also is allergic to amiodarone and amlodipine.  [name] [name]  has a past medical history of Bicuspid aortic valve, Heart attack ([place]), and Hypertension. Also  has a past surgical history that includes Vasectomy; Cardiac valve replacement; and Tonsillectomy.  Objective:   Vitals: BP 134/78   Pulse 71   Temp 99 F (37.2 C) (Oral)   Resp 17   Ht 5\' 6"  (1.676 m)   Wt 244 lb (110.7 kg)   SpO2 98%   BMI 39.38 kg/m   Physical Exam  Constitutional: He is oriented to person, place, and time. He appears well-developed and well-nourished.  HENT:  TMs intact bilaterally.  There is mucosal edema with erythema, nasal turbinates moist.  Throat with significant postnasal drainage.  No exudates.  Eyes: Right eye exhibits no discharge. Left eye exhibits no discharge. No scleral icterus.  Neck: Normal range of motion. Neck supple.  Cardiovascular: Normal rate, regular rhythm and intact distal pulses. Exam reveals no gallop and no friction rub.  No murmur heard. Pulmonary/Chest: No respiratory distress. He has no wheezes. He has no rales.  Lymphadenopathy:    He has no cervical adenopathy.  Neurological: He is alert and oriented to person, place, and time.  Skin: Skin is warm and dry.   Assessment and Plan :   Viral URI with cough  Nasal congestion  Throat pain  Likely viral in etiology d/t reassuring physical exam findings. Advised supportive care, offered symptomatic relief of cough syrup.  Counseled patient on potential for adverse effects with medications prescribed today, patient verbalized understanding. Return-to-clinic precautions discussed, patient verbalized understanding.    [name], PA-C Primary [facility] at [facility] [phone] 11/30/2017  4:49 PM

## 2017-11-30 NOTE — Patient Instructions (Addendum)
Hydrate well with at least 2 liters (1 gallon) of water daily. For sore throat try using a honey-based tea. Use 3 teaspoons of honey with juice squeezed from half lemon. Place shaved pieces of ginger into 1/2-1 cup of water and warm over stove top. Then mix the ingredients and repeat every 4 hours as needed.     Viral Respiratory Infection A respiratory infection is an illness that affects part of the respiratory system, such as the lungs, nose, or throat. Most respiratory infections are caused by either viruses or bacteria. A respiratory infection that is caused by a virus is called a viral respiratory infection. Common types of viral respiratory infections include:  A cold.  The flu (influenza).  A respiratory syncytial virus (RSV) infection.  How do I know if I have a viral respiratory infection? Most viral respiratory infections cause:  A stuffy or runny nose.  Yellow or green nasal discharge.  A cough.  Sneezing.  Fatigue.  Achy muscles.  A sore throat.  Sweating or chills.  A fever.  A headache.  How are viral respiratory infections treated? If influenza is diagnosed early, it may be treated with an antiviral medicine that shortens the length of time a person has symptoms. Symptoms of viral respiratory infections may be treated with over-the-counter and prescription medicines, such as:  Expectorants. These make it easier to cough up mucus.  Decongestant nasal sprays.  Health care providers do not prescribe antibiotic medicines for viral infections. This is because antibiotics are designed to kill bacteria. They have no effect on viruses. How do I know if I should stay home from work or school? To avoid exposing others to your respiratory infection, stay home if you have:  A fever.  A persistent cough.  A sore throat.  A runny nose.  Sneezing.  Muscles aches.  Headaches.  Fatigue.  Weakness.  Chills.  Sweating.  Nausea.  Follow these  instructions at home:  Rest as much as possible.  Take over-the-counter and prescription medicines only as told by your health care provider.  Drink enough fluid to keep your urine clear or pale yellow. This helps prevent dehydration and helps loosen up mucus.  Gargle with a salt-water mixture 3-4 times per day or as needed. To make a salt-water mixture, completely dissolve -1 tsp of salt in 1 cup of warm water.  Use nose drops made from salt water to ease congestion and soften raw skin around your nose.  Do not drink alcohol.  Do not use tobacco products, including cigarettes, chewing tobacco, and e-cigarettes. If you need help quitting, ask your health care provider. Contact a health care provider if:  Your symptoms last for 10 days or longer.  Your symptoms get worse over time.  You have a fever.  You have severe sinus pain in your face or forehead.  The glands in your jaw or neck become very swollen. Get help right away if:  You feel pain or pressure in your chest.  You have shortness of breath.  You faint or feel like you will faint.  You have severe and persistent vomiting.  You feel confused or disoriented. This information is not intended to replace advice given to you by your health care provider. Make sure you discuss any questions you have with your health care provider. Document Released: 02/24/2005 Document Revised: 10/23/2015 Document Reviewed: 10/23/2014 Elsevier Interactive Patient Education  2018 [employer].     IF you received an x-ray today, you will receive  an Pharmacologist from [facility]. Please contact [facility] at [phone] with questions or concerns regarding your invoice.   IF you received labwork today, you will receive an invoice from [place]. Please contact [facility] at [phone] with questions or concerns regarding your invoice.   Our billing staff will not be able to assist you with questions regarding bills from  these companies.  You will be contacted with the lab results as soon as they are available. The fastest way to get your results is to activate your My Chart account. Instructions are located on the last page of this paperwork. If you have not heard from [place] regarding the results in 2 weeks, please contact this office.

## 2017-12-13 DIAGNOSIS — G4733 Obstructive sleep apnea (adult) (pediatric): Secondary | ICD-10-CM | POA: Diagnosis not present

## 2017-12-17 ENCOUNTER — Ambulatory Visit: Payer: BLUE CROSS/BLUE SHIELD | Admitting: Physician Assistant

## 2017-12-17 ENCOUNTER — Encounter: Payer: Self-pay | Admitting: Physician Assistant

## 2017-12-17 ENCOUNTER — Other Ambulatory Visit: Payer: Self-pay

## 2017-12-17 VITALS — BP 135/75 | HR 58 | Temp 98.2°F | Resp 16 | Ht 67.0 in | Wt 238.6 lb

## 2017-12-17 DIAGNOSIS — R059 Cough, unspecified: Secondary | ICD-10-CM

## 2017-12-17 DIAGNOSIS — R194 Change in bowel habit: Secondary | ICD-10-CM | POA: Diagnosis not present

## 2017-12-17 DIAGNOSIS — R05 Cough: Secondary | ICD-10-CM

## 2017-12-17 DIAGNOSIS — Z566 Other physical and mental strain related to work: Secondary | ICD-10-CM | POA: Diagnosis not present

## 2017-12-17 MED ORDER — ESCITALOPRAM OXALATE 10 MG PO TABS: 10.0000 mg | ORAL_TABLET | Freq: Every day | ORAL | 1 refills | Status: DC

## 2017-12-17 MED ORDER — GUAIFENESIN ER 1200 MG PO TB12: 1.0000 | ORAL_TABLET | Freq: Two times a day (BID) | ORAL | 0 refills | Status: AC

## 2017-12-17 MED ORDER — ALPRAZOLAM 0.25 MG PO TABS: 0.2500 mg | ORAL_TABLET | Freq: Every day | ORAL | 0 refills | Status: DC

## 2017-12-17 MED ORDER — AMOXICILLIN 875 MG PO TABS: 875.0000 mg | ORAL_TABLET | Freq: Two times a day (BID) | ORAL | 0 refills | Status: DC

## 2017-12-17 NOTE — Patient Instructions (Signed)
     IF you received an x-ray today, you will receive an invoice from [facility]. Please contact [facility] at [phone] with questions or concerns regarding your invoice.   IF you received labwork today, you will receive an invoice from [facility]. Please contact [facility] at [phone] with questions or concerns regarding your invoice.   Our billing staff will not be able to assist you with questions regarding bills from these companies.  You will be contacted with the lab results as soon as they are available. The fastest way to get your results is to activate your My Chart account. Instructions are located on the last page of this paperwork. If you have not heard from us regarding the results in 2 weeks, please contact this office.     

## 2017-12-17 NOTE — Progress Notes (Signed)
[name] [name]  MRN: [id number] DOB: 1950-12-15  PCP: Mancel Bale, PA-C  Chief Complaint  Patient presents with  . Depression  . Cough    x 3 weeks ago    Subjective:  Pt presents to clinic for several concerns that are all written in a list by his wife.  3 weeks of cough - using cough medication and it helps - feels like there is something stuff in his throat - sometimes when it is productive it is clear - he has PND and some sinus congestion.  A few minutes of vertigo with several mintues when every he lays his head or lifts his head from laying down position - this has been going on for a couple of week - not changing - a couple of seconds and then resolves.    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 Uses Xanax every work day - due to stress.  This has been going on for a while and he does not see an and insight and the next several months of his situational work stress.  High protein diet - stool changes mainly constipation -this all happened with the change in his diet with his weight management plan.  History is obtained by patient.  Depression screen [facility] 2/9 12/17/2017 11/30/2017 10/18/2017 08/12/2017 11/08/2016  Decreased Interest 2 0 0 0 0  Down, Depressed, Hopeless 2 0 0 0 0  PHQ - 2 Score 4 0 0 0 0  Altered sleeping 0 - - - -  Tired, decreased energy 2 - - - -  Change in appetite 0 - - - -  Feeling bad or failure about yourself  1 - - - -  Trouble concentrating 0 - - - -  Moving slowly or fidgety/restless 0 - - - -  Suicidal thoughts 0 - - - -  PHQ-9 Score 7 - - - -     Review of Systems  HENT: Positive for congestion, postnasal drip and sore throat. Negative for rhinorrhea.   Respiratory: Positive for cough.   Neurological: Positive for dizziness.    Psychiatric/Behavioral: Negative for sleep disturbance. The patient is hyperactive.     Patient Active Problem List   Diagnosis Date Noted  . Osteoarthritis of knee 06/17/2017  . DOE (dyspnea on exertion) 08/16/2016  . PAF (paroxysmal atrial fibrillation) ([place]) 08/15/2016  . S/P ascending aortic aneurysm repair 03/28/2014  . S/P aortic valve replacement with bioprosthetic valve 03/28/2014  . Hx of maze procedure 03/28/2014  . BMI 38.0-38.9,adult 11/03/2011  . HYPERLIPIDEMIA, MIXED 04/06/2007  . Essential hypertension 04/06/2007  . HERNIATED LUMBAR DISC 04/06/2007  . Sleep apnea 04/06/2007    Current Outpatient Medications on File Prior to Visit  Medication Sig Dispense Refill  . amlodipine-atorvastatin (CADUET) 10-20 MG tablet Take 1 tablet by mouth daily. 90 tablet 3  . aspirin 325 MG EC tablet Take 325 mg by mouth daily.    Marland Kitchen HYDROcodone-homatropine (HYCODAN) 5-1.5 MG/5ML syrup Take 5 mLs by mouth at bedtime as needed. 100 mL 0  . metoprolol succinate (TOPROL-XL) 50 MG 24 hr tablet Take 1 tablet (50 mg total) by mouth daily. 90 tablet 3  . sildenafil (VIAGRA) 100 MG tablet Take 0.5-1 tablets (50-100 mg total) by mouth daily as needed for  erectile dysfunction. 5 tablet 11   No current facility-administered medications on file prior to visit.     Allergies  Allergen Reactions  . Amiodarone Rash  . Amlodipine Rash    Past Medical History:  Diagnosis Date  . Bicuspid aortic valve    Bentall procedure 2008  . Heart attack ([place])   . 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   Patient has two children.   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   Patient has a college education.   Social History   Tobacco Use  . Smoking status: Former Smoker    Packs/day: 0.30    Years: 25.00    Pack years: 7.50    Last attempt to quit: 10/04/2003    Years since  quitting: 14.2  . Smokeless tobacco: Never Used  Substance Use Topics  . Alcohol use: Not Currently    Alcohol/week: 0.0 oz  . Drug use: No   family history includes Atrial fibrillation in his brother and father; COPD in his mother; Cancer in his father, maternal grandfather, and maternal grandmother; Kidney disease in his maternal grandfather and maternal grandmother; Stroke in his paternal grandfather.     Objective:  BP 135/75 (BP Location: Right Arm)   Pulse (!) 58   Temp 98.2 F (36.8 C) (Oral)   Resp 16   Ht 5\' 7"  (1.702 m)   Wt 238 lb 9.6 oz (108.2 kg)   SpO2 94%   BMI 37.37 kg/m  Body mass index is 37.37 kg/m.  Wt Readings from Last 3 Encounters:  12/17/17 238 lb 9.6 oz (108.2 kg)  11/30/17 244 lb (110.7 kg)  10/18/17 244 lb 3.2 oz (110.8 kg)    Physical Exam  Constitutional: He is oriented to person, place, and time. He appears well-developed and well-nourished.  HENT:  Head: Normocephalic and atraumatic.  Right Ear: Hearing, tympanic membrane, external ear and ear canal normal.  Left Ear: Hearing, tympanic membrane, external ear and ear canal normal.  Nose: Nose normal.  Mouth/Throat: Uvula is midline, oropharynx is clear and moist and mucous membranes are normal.  Eyes: Conjunctivae are normal.  Neck: Normal range of motion.  Cardiovascular: Normal rate, regular rhythm and normal heart sounds.  No murmur heard. Pulmonary/Chest: Effort normal and breath sounds normal. He has no wheezes.  Lymphadenopathy:       Head (right side): No tonsillar adenopathy present.       Head (left side): No tonsillar adenopathy present.    He has no cervical adenopathy.       Right: No supraclavicular adenopathy present.       Left: No supraclavicular adenopathy present.  Neurological: He is alert and oriented to person, place, and time.  Skin: Skin is warm and dry.  Psychiatric: Judgment normal.  Vitals reviewed.   Assessment and Plan :  Cough - Plan: amoxicillin  (AMOXIL) 875 MG tablet, Guaifenesin (MUCINEX MAXIMUM STRENGTH) 1200 MG TB12 -sounds like patient has a sinus infection we will treat accordingly.  Suspect his symptoms to be resolved in 10 to 14 days.  Work stress - Plan: ALPRAZolam (XANAX) 0.25 MG tablet, escitalopram (LEXAPRO) 10 MG tablet -discussed with patient at length work stress and symptoms of stress leading to anxiety.  Discussed with patient the addictive nature of Xanax though he takes very sparingly at low doses.  Do feel like starting an SSRI for patient is what should be done at this point mainly to help him feel better so he does not have to use rescue Xanax.  We are unsure how long his work will continue but once his work and that he feels like his stress is gone we will start titrating off of Lexapro to symptom control.  Discussed with patient how to start medicine what to expect from this medicine and what his follow-up with this medication should be as well as any side effects that  may potentially happen.  stool changes seems to coincide with his high protein diet changes.  Patient has Cologuard at home he will do.-He really does not want to do colonoscopy and I think with the stool changes that using Cologuard would still be appropriate.  He has noticed no blood.  Patient verbalized to me that they understand the following: diagnosis, what is being done for them, what to expect and what should be done at home.  Their questions have been answered.  See after visit summary for patient specific instructions.  Windell Hummingbird PA-C  [facility] 12/22/2017 6:50 PM  Please note: Portions of this report may have been transcribed using dragon voice recognition software. Every effort was made to ensure accuracy; however, inadvertent computerized transcription errors may be present.

## 2017-12-22 ENCOUNTER — Encounter: Payer: Self-pay | Admitting: Physician Assistant

## 2018-01-05 ENCOUNTER — Telehealth: Payer: Self-pay | Admitting: Physician Assistant

## 2018-01-06 DIAGNOSIS — M1711 Unilateral primary osteoarthritis, right knee: Secondary | ICD-10-CM | POA: Diagnosis not present

## 2018-01-06 DIAGNOSIS — M1712 Unilateral primary osteoarthritis, left knee: Secondary | ICD-10-CM | POA: Diagnosis not present

## 2018-01-10 ENCOUNTER — Encounter: Payer: Self-pay | Admitting: Physician Assistant

## 2018-01-18 ENCOUNTER — Telehealth: Payer: Self-pay | Admitting: Interventional Cardiology

## 2018-01-18 NOTE — Telephone Encounter (Signed)
° °  [facility] Pre-operative Risk Assessment    Request for surgical clearance:  1. What type of surgery is being performed? Left TKA  2. When is this surgery scheduled? 03/20/2018  3. What type of clearance is required (medical clearance vs. Pharmacy clearance to hold med vs. Both)? Medical  4. Are there any medications that need to be held prior to surgery and how long? Please advise if there is any medications that need to be held and how long.   5. Practice name and name of physician performing surgery? [facility], Dr. Wynelle Link  6. What is your office phone number [phone]  ATTN: [name] [name]   7.   What is your office fax number [phone]  8.   Anesthesia type (None, local, MAC, general) ? Choice    [name] [name] 01/18/2018, 10:22 AM  _________________________________________________________________   (provider comments below)

## 2018-01-19 ENCOUNTER — Encounter: Payer: Self-pay | Admitting: Physician Assistant

## 2018-01-21 ENCOUNTER — Ambulatory Visit: Payer: BLUE CROSS/BLUE SHIELD | Admitting: Physician Assistant

## 2018-01-23 NOTE — Telephone Encounter (Signed)
Left a message for patient to call back. 

## 2018-01-24 NOTE — Telephone Encounter (Signed)
   Primary Cardiologist: Sinclair Grooms, MD  Chart reviewed as part of pre-operative protocol coverage. Patient was contacted 01/24/2018 in reference to pre-operative risk assessment for pending surgery as outlined below.  DELROY ORDWAY was last seen on 08/29/17 by Dr. Tamala Julian.  Since that day, LAYTHAN HAYTER has done well. He can complete 4.0 METS slowly, limited by knee pain.  Therefore, based on ACC/AHA guidelines, the patient would be at acceptable risk for the planned procedure without further cardiovascular testing.   I will route this recommendation to the requesting party via Epic fax function and remove from pre-op pool.  Please call with questions.  Tami Lin Sela Falk, PA 01/24/2018, 3:42 PM

## 2018-01-27 ENCOUNTER — Other Ambulatory Visit: Payer: Self-pay | Admitting: Physician Assistant

## 2018-01-27 DIAGNOSIS — Z566 Other physical and mental strain related to work: Secondary | ICD-10-CM

## 2018-01-27 MED ORDER — ALPRAZOLAM 0.25 MG PO TABS: 0.2500 mg | ORAL_TABLET | Freq: Every day | ORAL | 0 refills | Status: DC

## 2018-02-01 DIAGNOSIS — M1711 Unilateral primary osteoarthritis, right knee: Secondary | ICD-10-CM | POA: Diagnosis not present

## 2018-02-08 DIAGNOSIS — M1711 Unilateral primary osteoarthritis, right knee: Secondary | ICD-10-CM | POA: Diagnosis not present

## 2018-02-09 ENCOUNTER — Other Ambulatory Visit: Payer: Self-pay

## 2018-02-09 ENCOUNTER — Encounter: Payer: Self-pay | Admitting: Physician Assistant

## 2018-02-09 ENCOUNTER — Ambulatory Visit: Payer: BLUE CROSS/BLUE SHIELD | Admitting: Physician Assistant

## 2018-02-09 ENCOUNTER — Telehealth: Payer: Self-pay | Admitting: Interventional Cardiology

## 2018-02-09 VITALS — BP 129/84 | HR 72 | Temp 99.0°F | Resp 18 | Ht 67.0 in | Wt 236.6 lb

## 2018-02-09 DIAGNOSIS — Z566 Other physical and mental strain related to work: Secondary | ICD-10-CM | POA: Diagnosis not present

## 2018-02-09 DIAGNOSIS — F419 Anxiety disorder, unspecified: Secondary | ICD-10-CM

## 2018-02-09 DIAGNOSIS — Z23 Encounter for immunization: Secondary | ICD-10-CM | POA: Diagnosis not present

## 2018-02-09 MED ORDER — ESCITALOPRAM OXALATE 20 MG PO TABS: 20.0000 mg | ORAL_TABLET | Freq: Every day | ORAL | 0 refills | Status: AC

## 2018-02-09 MED ORDER — ALPRAZOLAM 0.25 MG PO TABS: 0.2500 mg | ORAL_TABLET | Freq: Every day | ORAL | 0 refills | Status: DC

## 2018-02-09 NOTE — Telephone Encounter (Signed)
Ok per [employer]- left detailed message letting pt know that he did not need to keep the appt with Richardson Dopp. Advised to call back if any questions.

## 2018-02-09 NOTE — Telephone Encounter (Signed)
° ° °  Patient has been cleared for upcoming procedure. Patient wants to know if it is necessary for him to keep appt on 9/16 with Richardson Dopp- PA. Please advise

## 2018-02-09 NOTE — Patient Instructions (Signed)
° ° ° °  If you have lab work done today you will be contacted with your lab results within the next 2 weeks.  If you have not heard from us then please contact us. The fastest way to get your results is to register for My Chart. ° ° °IF you received an x-ray today, you will receive an invoice from [facility]. Please contact [facility] at [phone] with questions or concerns regarding your invoice.  ° °IF you received labwork today, you will receive an invoice from [facility]. Please contact [facility] at [phone] with questions or concerns regarding your invoice.  ° °Our billing staff will not be able to assist you with questions regarding bills from these companies. ° °You will be contacted with the lab results as soon as they are available. The fastest way to get your results is to activate your My Chart account. Instructions are located on the last page of this paperwork. If you have not heard from us regarding the results in 2 weeks, please contact this office. °  ° ° ° °

## 2018-02-09 NOTE — Progress Notes (Signed)
[name] [name]  MRN: [id number] DOB: [birth year]  PCP: Mancel Bale, PA-C  Chief Complaint  Patient presents with  . Depression    follow up     Subjective:  Pt presents to clinic for medication recheck.  His mood and anxiety has been much better since start of Lexapro.  He does feel like it could improve.  He is still using Xanax daily on days of work.  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  He is hoping this will decrease his anxiety.  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  Left knee replacement planned for Oct 2019  History is obtained by patient.  Review of Systems  Psychiatric/Behavioral: Positive for dysphoric mood (Improved on medication). Negative for sleep disturbance. The patient is nervous/anxious (Improved on medication).     Patient Active Problem List   Diagnosis Date Noted  . Osteoarthritis of knee 06/17/2017  . DOE (dyspnea on exertion) 08/16/2016  . PAF (paroxysmal atrial fibrillation) ([place]) 08/15/2016  . S/P ascending aortic aneurysm repair 03/28/2014  . S/P aortic valve replacement with bioprosthetic valve 03/28/2014  . Hx of maze procedure 03/28/2014  . BMI 38.0-38.9,adult 11/03/2011  . HYPERLIPIDEMIA, MIXED 04/06/2007  . Essential hypertension 04/06/2007  . HERNIATED LUMBAR DISC 04/06/2007  . Sleep apnea 04/06/2007    Current Outpatient Medications on File Prior to Visit  Medication Sig Dispense Refill  . amlodipine-atorvastatin (CADUET) 10-20 MG tablet Take 1 tablet by mouth daily. 90 tablet 3  . aspirin 325 MG EC tablet Take 325 mg by mouth daily.    . metoprolol succinate (TOPROL-XL) 50 MG 24 hr tablet Take 1 tablet (50 mg total) by mouth daily. 90 tablet 3  . sildenafil (VIAGRA) 100 MG tablet Take 0.5-1 tablets (50-100 mg total) by mouth daily  as needed for erectile dysfunction. 5 tablet 11   No current facility-administered medications on file prior to visit.     Allergies  Allergen Reactions  . Amiodarone Rash  . Amlodipine Rash    Past Medical History:  Diagnosis Date  . Bicuspid aortic valve    Bentall procedure 2008  . Heart attack ([place])   . 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   Patient has two children.   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   Patient has a college education.   Social History   Tobacco Use  . Smoking status: Former Smoker    Packs/day: 0.30    Years: 25.00    Pack years: 7.50    Last attempt to quit: 10/04/2003    Years since quitting: 14.3  . Smokeless tobacco: Never Used  Substance Use Topics  . Alcohol use: Not Currently    Alcohol/week: 0.0 standard drinks  . Drug use: No   family history includes Atrial fibrillation in his brother and father; COPD in his mother; Cancer in his father, maternal grandfather, and maternal grandmother; Kidney disease in his maternal grandfather and maternal grandmother; Stroke in his paternal grandfather.     Objective:  BP 129/84   Pulse 72   Temp 99 F (37.2 C) (  Oral)   Resp 18   Ht 5\' 7"  (1.702 m)   Wt 236 lb 9.6 oz (107.3 kg)   SpO2 97%   BMI 37.06 kg/m  Body mass index is 37.06 kg/m.  Wt Readings from Last 3 Encounters:  02/09/18 236 lb 9.6 oz (107.3 kg)  12/17/17 238 lb 9.6 oz (108.2 kg)  11/30/17 244 lb (110.7 kg)    Physical Exam  Constitutional: He is oriented to person, place, and time. He appears well-developed and well-nourished.  HENT:  Head: Normocephalic and atraumatic.  Right Ear: External ear normal.  Left Ear: External ear normal.  Eyes: Conjunctivae are normal.  Neck: Normal range of motion.  Pulmonary/Chest: Effort normal.  Neurological: He is alert and oriented to person, place, and  time.  Skin: Skin is warm and dry.  Psychiatric: Judgment normal.  Vitals reviewed.   Assessment and Plan :  Work stress - Plan: escitalopram (LEXAPRO) 20 MG tablet, ALPRAZolam (XANAX) 0.25 MG tablet  Flu vaccine need - Plan: Flu Vaccine QUAD 36+ mos IM  Patient is doing much better on Lexapro will increase dose to 20 mg to see if we can get some improved control.  Will continue Xanax for now the goal is hopefully needing less so he will try to reduce the dose on a daily basis in about 2 to 3 weeks.  Updated flu vaccine.  Encourage patient to continue weight loss as he gets closer to his knee replacement date.  He is set up with home health care after the surgery through his orthopedic surgeon.  He will follow-up with me in 6 weeks.  Patient verbalized to me that they understand the following: diagnosis, what is being done for them, what to expect and what should be done at home.  Their questions have been answered.  See after visit summary for patient specific instructions.  Windell Hummingbird PA-C  [facility] 02/15/2018 7:59 AM  Please note: Portions of this report may have been transcribed using dragon voice recognition software. Every effort was made to ensure accuracy; however, inadvertent computerized transcription errors may be present.

## 2018-02-10 ENCOUNTER — Ambulatory Visit: Payer: BLUE CROSS/BLUE SHIELD | Admitting: Physician Assistant

## 2018-02-13 ENCOUNTER — Ambulatory Visit: Payer: BLUE CROSS/BLUE SHIELD | Admitting: Physician Assistant

## 2018-02-13 ENCOUNTER — Encounter

## 2018-02-15 DIAGNOSIS — M1711 Unilateral primary osteoarthritis, right knee: Secondary | ICD-10-CM | POA: Diagnosis not present

## 2018-02-24 NOTE — H&P (Signed)
TOTAL KNEE ADMISSION H&P  Patient is being admitted for left total knee arthroplasty.  Subjective:  Chief Complaint:left knee pain.  HPI: [name] [name], 67 y.o. male, has a history of pain and functional disability in the left knee due to arthritis and has failed non-surgical conservative treatments for greater than 12 weeks to includecorticosteriod injections, viscosupplementation injections and activity modification.  Onset of symptoms was gradual, starting 4 years ago with gradually worsening course since that time. The patient noted prior procedures on the knee to include  arthroscopy on the left knee(s).  Patient currently rates pain in the left knee(s) at 4 out of 10 with activity. Patient has worsening of pain with activity and weight bearing, crepitus and instability.  Patient has evidence of  significant bone-on-bone osteoarthritis of the medial and patellofemoral compartments by imaging studies. There is no active infection.  Patient Active Problem List   Diagnosis Date Noted  . Osteoarthritis of knee 06/17/2017  . DOE (dyspnea on exertion) 08/16/2016  . PAF (paroxysmal atrial fibrillation) ([place]) 08/15/2016  . S/P ascending aortic aneurysm repair 03/28/2014  . S/P aortic valve replacement with bioprosthetic valve 03/28/2014  . Hx of maze procedure 03/28/2014  . BMI 38.0-38.9,adult 11/03/2011  . HYPERLIPIDEMIA, MIXED 04/06/2007  . Essential hypertension 04/06/2007  . HERNIATED LUMBAR DISC 04/06/2007  . Sleep apnea 04/06/2007   Past Medical History:  Diagnosis Date  . Bicuspid aortic valve    Bentall procedure 2008  . Heart attack ([place])   . Hypertension     Past Surgical History:  Procedure Laterality Date  . CARDIAC VALVE REPLACEMENT     Bentall procedure, 2008  . TONSILLECTOMY     age 76  . VASECTOMY      No current facility-administered medications for this encounter.    Current Outpatient Medications  Medication Sig Dispense Refill Last Dose  . ALPRAZolam (XANAX)  0.25 MG tablet Take 1 tablet (0.25 mg total) by mouth daily. 30 tablet 0   . amlodipine-atorvastatin (CADUET) 10-20 MG tablet Take 1 tablet by mouth daily. 90 tablet 3 Taking  . aspirin 325 MG EC tablet Take 325 mg by mouth daily.   Taking  . escitalopram (LEXAPRO) 20 MG tablet Take 1 tablet (20 mg total) by mouth daily. 90 tablet 0   . metoprolol succinate (TOPROL-XL) 50 MG 24 hr tablet Take 1 tablet (50 mg total) by mouth daily. 90 tablet 3 Taking  . sildenafil (VIAGRA) 100 MG tablet Take 0.5-1 tablets (50-100 mg total) by mouth daily as needed for erectile dysfunction. 5 tablet 11 Taking   Allergies  Allergen Reactions  . Amiodarone Rash  . Amlodipine Rash    Social History   Tobacco Use  . Smoking status: Former Smoker    Packs/day: 0.30    Years: 25.00    Pack years: 7.50    Last attempt to quit: 10/04/2003    Years since quitting: 14.4  . Smokeless tobacco: Never Used  Substance Use Topics  . Alcohol use: Not Currently    Alcohol/week: 0.0 standard drinks    Family History  Problem Relation Age of Onset  . COPD Mother   . Cancer Father   . Atrial fibrillation Father   . Atrial fibrillation Brother   . Cancer Maternal Grandmother        died at age 68  . Kidney disease Maternal Grandmother   . Cancer Maternal Grandfather   . Kidney disease Maternal Grandfather   . Stroke Paternal Grandfather  Review of Systems  Constitutional: Negative for chills and fever.  HENT: Negative for congestion, sore throat and tinnitus.   Eyes: Negative for double vision, photophobia and pain.  Respiratory: Negative for cough, shortness of breath and wheezing.   Cardiovascular: Negative for chest pain, palpitations and orthopnea.  Gastrointestinal: Negative for heartburn, nausea and vomiting.  Genitourinary: Negative for dysuria, frequency and urgency.  Musculoskeletal: Positive for joint pain.  Neurological: Negative for dizziness, weakness and headaches.  Psychiatric/Behavioral:  Negative for depression.    Objective:  Physical Exam  Well nourished and well developed. General: Alert and oriented x3, cooperative and pleasant, no acute distress. Head: normocephalic, atraumatic, neck supple. Eyes: EOMI. Respiratory: breath sounds clear in all fields, no wheezing, rales, or rhonchi. Cardiovascular: Regular rate and rhythm, no murmurs, gallops or rubs.  Abdomen: non-tender to palpation and soft, normoactive bowel sounds. Musculoskeletal: Significant antalgic gait favoring the left and walking without assistive devices.  Bilateral Hip Exam: ROM: is normal without discomfort. There is no tenderness over the greater trochanter bursa. There is no pain on provocative testing of the hip. Right Knee Exam: No effusion. Significant varus deformity. The deformity corrects back to neutral when stressed from a valgus direction. Range of motion is 5-125 degrees. Market crepitus on range of motion of the knee. Significant medial joint line tenderness noted. No lateral joint line tenderness. Significant instability to valgus stressing.  Left Knee Exam: No effusion. No Swelling. Minimal varus deformity. The deformity corrects back to neutral when stressed from a valgus direction. Range of motion is 0-125 degrees. Moderate crepitus on range of motion of the knee. Medial joint line tenderness noted.No lateral joint line tenderness. Significant instability to valgus stressing.  Calves soft and nontender. Motor function intact in LE. Strength 5/5 LE bilaterally. Neuro: Distal pulses 2+. Sensation to light touch intact in LE.  Vital signs in last 24 hours: Blood pressure: 132/82 mmHg Pulse: 60 bpm   Labs:   Estimated body mass index is 37.06 kg/m as calculated from the following:   Height as of 02/09/18: 5\' 7"  (1.702 m).   Weight as of 02/09/18: 107.3 kg.   Imaging Review Plain radiographs demonstrate severe degenerative joint disease of the left knee(s). The overall alignment ismild  varus. The bone quality appears to be adequate for age and reported activity level.   Preoperative templating of the joint replacement has been completed, documented, and submitted to the Operating Room personnel in order to optimize intra-operative equipment management.   Anticipated LOS equal to or greater than 2 midnights due to - Age 43 and older with one or more of the following:  - Obesity  - Expected need for hospital services (PT, OT, Nursing) required for safe  discharge  - Anticipated need for postoperative skilled nursing care or inpatient rehab  - Active co-morbidities: Coronary Artery Disease OR   - Unanticipated findings during/Post Surgery: None  - Patient is a high risk of re-admission due to: None     Assessment/Plan:  End stage arthritis, left knee   The patient history, physical examination, clinical judgment of the provider and imaging studies are consistent with end stage degenerative joint disease of the left knee(s) and total knee arthroplasty is deemed medically necessary. The treatment options including medical management, injection therapy arthroscopy and arthroplasty were discussed at length. The risks and benefits of total knee arthroplasty were presented and reviewed. The risks due to aseptic loosening, infection, stiffness, patella tracking problems, thromboembolic complications and other imponderables were discussed. The  patient acknowledged the explanation, agreed to proceed with the plan and consent was signed. Patient is being admitted for inpatient treatment for surgery, pain control, PT, OT, prophylactic antibiotics, VTE prophylaxis, progressive ambulation and ADL's and discharge planning. The patient is planning to be discharged home with HHPT.   Therapy Plans: HHPT then outpatient therapy at [facility] Disposition: Home with wife Planned DVT Prophylaxis: Xarelto 10 mg daily (history of Afib and bovine aortic valve) DME needed: Gilford Rile, 3-n-1 PCP:  Dr. Titus Dubin Cardiologist: Dr. Daneen Schick TXA: IV Allergies: NKDA Anesthesia Concerns: None Last HgbA1c: 5.1%   - Patient was instructed on what medications to stop prior to surgery. - Follow-up visit in 2 weeks with Dr. Wynelle Link - Begin physical therapy following surgery - Pre-operative lab work as pre-surgical testing - Prescriptions will be provided in hospital at time of discharge  Theresa Duty, PA-C Orthopedic Surgery [facility] [place]

## 2018-03-01 NOTE — Patient Instructions (Addendum)
[name] [name]  03/01/2018   Your procedure is scheduled on: Monday 03/20/2018  Report to [facility] Main  Entrance              Report to admitting at   0600 AM    Call this number if you have problems the morning of surgery [phone]    Remember: Do not eat food or drink liquids :After Midnight.               Please bring CPAP mask and tubing with you to the hospital The morning of surgery!              BRUSH YOUR TEETH MORNING OF SURGERY AND RINSE YOUR MOUTH OUT, NO CHEWING GUM CANDY OR MINTS.     Take these medicines the morning of surgery with A SIP OF WATER:  Metoprolol succinate (Toprol XL), Escitalopram (Lexapro)                                You may not have any metal on your body including hair pins and              piercings  Do not wear jewelry, make-up, lotions, powders or perfumes, deodorant                         Men may shave face and neck.   Do not bring valuables to the hospital. [facility].  Contacts, dentures or bridgework may not be worn into surgery.  Leave suitcase in the car. After surgery it may be brought to your room.                  Please read over the following fact sheets you were given: _____________________________________________________________________             [facility] - Preparing for Surgery Before surgery, you can play an important role.  Because skin is not sterile, your skin needs to be as free of germs as possible.  You can reduce the number of germs on your skin by washing with CHG (chlorahexidine gluconate) soap before surgery.  CHG is an antiseptic cleaner which kills germs and bonds with the skin to continue killing germs even after washing. Please DO NOT use if you have an allergy to CHG or antibacterial soaps.  If your skin becomes reddened/irritated stop using the CHG and inform your nurse when you arrive at Short Stay. Do not shave (including  legs and underarms) for at least 48 hours prior to the first CHG shower.  You may shave your face/neck. Please follow these instructions carefully:  1.  Shower with CHG Soap the night before surgery and the  morning of Surgery.  2.  If you choose to wash your hair, wash your hair first as usual with your  normal  shampoo.  3.  After you shampoo, rinse your hair and body thoroughly to remove the  shampoo.                           4.  Use CHG as you would any other liquid soap.  You can apply chg directly  to  the skin and wash                       Gently with a scrungie or clean washcloth.  5.  Apply the CHG Soap to your body ONLY FROM THE NECK DOWN.   Do not use on face/ open                           Wound or open sores. Avoid contact with eyes, ears mouth and genitals (private parts).                       Wash face,  Genitals (private parts) with your normal soap.             6.  Wash thoroughly, paying special attention to the area where your surgery  will be performed.  7.  Thoroughly rinse your body with warm water from the neck down.  8.  DO NOT shower/wash with your normal soap after using and rinsing off  the CHG Soap.                9.  Pat yourself dry with a clean towel.            10.  Wear clean pajamas.            11.  Place clean sheets on your bed the night of your first shower and do not  sleep with pets. Day of Surgery : Do not apply any lotions/deodorants the morning of surgery.  Please wear clean clothes to the hospital/surgery center.  FAILURE TO FOLLOW THESE INSTRUCTIONS MAY RESULT IN THE CANCELLATION OF YOUR SURGERY PATIENT SIGNATURE_________________________________  NURSE SIGNATURE__________________________________  ________________________________________________________________________   [name] [name]  An incentive spirometer is a tool that can help keep your lungs clear and active. This tool measures how well you are filling your lungs with each breath.  Taking long deep breaths may help reverse or decrease the chance of developing breathing (pulmonary) problems (especially infection) following:  A long period of time when you are unable to move or be active. BEFORE THE PROCEDURE   If the spirometer includes an indicator to show your best effort, your nurse or respiratory therapist will set it to a desired goal.  If possible, sit up straight or lean slightly forward. Try not to slouch.  Hold the incentive spirometer in an upright position. INSTRUCTIONS FOR USE  1. Sit on the edge of your bed if possible, or sit up as far as you can in bed or on a chair. 2. Hold the incentive spirometer in an upright position. 3. Breathe out normally. 4. Place the mouthpiece in your mouth and seal your lips tightly around it. 5. Breathe in slowly and as deeply as possible, raising the piston or the ball toward the top of the column. 6. Hold your breath for 3-5 seconds or for as long as possible. Allow the piston or ball to fall to the bottom of the column. 7. Remove the mouthpiece from your mouth and breathe out normally. 8. Rest for a few seconds and repeat Steps 1 through 7 at least 10 times every 1-2 hours when you are awake. Take your time and take a few normal breaths between deep breaths. 9. The spirometer may include an indicator to show your best effort. Use the indicator as a goal to work toward during each repetition. 10. After each set  of 10 deep breaths, practice coughing to be sure your lungs are clear. If you have an incision (the cut made at the time of surgery), support your incision when coughing by placing a pillow or rolled up towels firmly against it. Once you are able to get out of bed, walk around indoors and cough well. You may stop using the incentive spirometer when instructed by your caregiver.  RISKS AND COMPLICATIONS  Take your time so you do not get dizzy or light-headed.  If you are in pain, you may need to take or ask for pain  medication before doing incentive spirometry. It is harder to take a deep breath if you are having pain. AFTER USE  Rest and breathe slowly and easily.  It can be helpful to keep track of a log of your progress. Your caregiver can provide you with a simple table to help with this. If you are using the spirometer at home, follow these instructions: [place] IF:   You are having difficultly using the spirometer.  You have trouble using the spirometer as often as instructed.  Your pain medication is not giving enough relief while using the spirometer.  You develop fever of 100.5 F (38.1 C) or higher. SEEK IMMEDIATE MEDICAL CARE IF:   You cough up bloody sputum that had not been present before.  You develop fever of 102 F (38.9 C) or greater.  You develop worsening pain at or near the incision site. MAKE SURE YOU:   Understand these instructions.  Will watch your condition.  Will get help right away if you are not doing well or get worse. Document Released: 09/27/2006 Document Revised: 08/09/2011 Document Reviewed: 11/28/2006 Exit[employer] Patient Information 2014 Exit[employer], [place].   ________________________________________________________________________  WHAT IS A BLOOD TRANSFUSION? Blood Transfusion Information  A transfusion is the replacement of blood or some of its parts. Blood is made up of multiple cells which provide different functions.  Red blood cells carry oxygen and are used for blood loss replacement.  White blood cells fight against infection.  Platelets control bleeding.  Plasma helps clot blood.  Other blood products are available for specialized needs, such as hemophilia or other clotting disorders. BEFORE THE TRANSFUSION  Who gives blood for transfusions?   Healthy volunteers who are fully evaluated to make sure their blood is safe. This is blood bank blood. Transfusion therapy is the safest it has ever been in the practice of medicine.  Before blood is taken from a donor, a complete history is taken to make sure that person has no history of diseases nor engages in risky social behavior (examples are intravenous drug use or sexual activity with multiple partners). The donor's travel history is screened to minimize risk of transmitting infections, such as malaria. The donated blood is tested for signs of infectious diseases, such as HIV and hepatitis. The blood is then tested to be sure it is compatible with you in order to minimize the chance of a transfusion reaction. If you or a relative donates blood, this is often done in anticipation of surgery and is not appropriate for emergency situations. It takes many days to process the donated blood. RISKS AND COMPLICATIONS Although transfusion therapy is very safe and saves many lives, the main dangers of transfusion include:   Getting an infectious disease.  Developing a transfusion reaction. This is an allergic reaction to something in the blood you were given. Every precaution is taken to prevent this. The decision to have a  blood transfusion has been considered carefully by your caregiver before blood is given. Blood is not given unless the benefits outweigh the risks. AFTER THE TRANSFUSION  Right after receiving a blood transfusion, you will usually feel much better and more energetic. This is especially true if your red blood cells have gotten low (anemic). The transfusion raises the level of the red blood cells which carry oxygen, and this usually causes an energy increase.  The nurse administering the transfusion will monitor you carefully for complications. HOME CARE INSTRUCTIONS  No special instructions are needed after a transfusion. You may find your energy is better. Speak with your caregiver about any limitations on activity for underlying diseases you may have. SEEK MEDICAL CARE IF:   Your condition is not improving after your transfusion.  You develop redness or  irritation at the intravenous (IV) site. SEEK IMMEDIATE MEDICAL CARE IF:  Any of the following symptoms occur over the next 12 hours:  Shaking chills.  You have a temperature by mouth above 102 F (38.9 C), not controlled by medicine.  Chest, back, or muscle pain.  People around you feel you are not acting correctly or are confused.  Shortness of breath or difficulty breathing.  Dizziness and fainting.  You get a rash or develop hives.  You have a decrease in urine output.  Your urine turns a dark color or changes to pink, red, or brown. Any of the following symptoms occur over the next 10 days:  You have a temperature by mouth above 102 F (38.9 C), not controlled by medicine.  Shortness of breath.  Weakness after normal activity.  The white part of the eye turns yellow (jaundice).  You have a decrease in the amount of urine or are urinating less often.  Your urine turns a dark color or changes to pink, red, or brown. Document Released: 05/14/2000 Document Revised: 08/09/2011 Document Reviewed: 01/01/2008 [facility] Patient Information 2014 [place], [place].  _______________________________________________________________________

## 2018-03-01 NOTE — Progress Notes (Signed)
01/18/2018-Cardiac Clearance on chart and in Epic  from Maryfrances Bunnell   08/29/2017- Office visit with Dr. Daneen Schick in [place] and EKG  07/21/2015- noted in [place]

## 2018-03-13 ENCOUNTER — Encounter (HOSPITAL_COMMUNITY): Payer: Self-pay

## 2018-03-13 ENCOUNTER — Encounter (HOSPITAL_COMMUNITY)
Admission: RE | Admit: 2018-03-13 | Discharge: 2018-03-13 | Disposition: A | Discharge status: Home / Self Care | Payer: BLUE CROSS/BLUE SHIELD | Source: Ambulatory Visit | Attending: Orthopedic Surgery | Admitting: Orthopedic Surgery

## 2018-03-13 ENCOUNTER — Other Ambulatory Visit: Payer: Self-pay

## 2018-03-13 DIAGNOSIS — Z01812 Encounter for preprocedural laboratory examination: Secondary | ICD-10-CM | POA: Insufficient documentation

## 2018-03-13 LAB — PROTIME-INR
INR: 0.89
PROTHROMBIN TIME: 11.9 s (ref 11.4–15.2)

## 2018-03-13 LAB — COMPREHENSIVE METABOLIC PANEL
ALT: 32 U/L (ref 0–44)
AST: 26 U/L (ref 15–41)
Albumin: 4.2 g/dL (ref 3.5–5.0)
Alkaline Phosphatase: 80 U/L (ref 38–126)
Anion gap: 10 (ref 5–15)
BUN: 33 mg/dL — AB (ref 8–23)
CO2: 26 mmol/L (ref 22–32)
CREATININE: 1.13 mg/dL (ref 0.61–1.24)
Calcium: 9.4 mg/dL (ref 8.9–10.3)
Chloride: 105 mmol/L (ref 98–111)
GFR calc Af Amer: >60 mL/min (ref >60)
Glucose, Bld: 106 mg/dL — ABNORMAL HIGH (ref 70–99)
Potassium: 4.8 mmol/L (ref 3.5–5.1)
Sodium: 141 mmol/L (ref 135–145)
Total Bilirubin: 0.7 mg/dL (ref 0.3–1.2)
Total Protein: 7.2 g/dL (ref 6.5–8.1)

## 2018-03-13 LAB — CBC
HEMATOCRIT: 46.6 % (ref 39.0–52.0)
Hemoglobin: 14.8 g/dL (ref 13.0–17.0)
MCH: 30.1 pg (ref 26.0–34.0)
MCHC: 31.8 g/dL (ref 30.0–36.0)
MCV: 94.9 fL (ref 80.0–100.0)
NRBC: 0 % (ref 0.0–0.2)
PLATELETS: 225 10*3/uL (ref 150–400)
RBC: 4.91 MIL/uL (ref 4.22–5.81)
RDW: 13.5 % (ref 11.5–15.5)
WBC: 6.8 10*3/uL (ref 4.0–10.5)

## 2018-03-13 LAB — APTT: aPTT: 36 seconds (ref 24–36)

## 2018-03-13 LAB — ABO/RH: ABO/RH(D): O POS

## 2018-03-13 LAB — SURGICAL PCR SCREEN
MRSA, PCR: NEGATIVE
Staphylococcus aureus: NEGATIVE

## 2018-03-16 DIAGNOSIS — F419 Anxiety disorder, unspecified: Secondary | ICD-10-CM | POA: Diagnosis not present

## 2018-03-16 DIAGNOSIS — F40248 Other situational type phobia: Secondary | ICD-10-CM | POA: Diagnosis not present

## 2018-03-17 ENCOUNTER — Encounter (HOSPITAL_COMMUNITY): Payer: Self-pay | Admitting: Anesthesiology

## 2018-03-17 NOTE — Anesthesia Preprocedure Evaluation (Addendum)
Anesthesia Evaluation  Patient identified by MRN, date of birth, ID band Patient awake    Reviewed: Allergy & Precautions, NPO status , Patient's Chart, lab work & pertinent test results  Airway Mallampati: I       Dental no notable dental hx. (+) Teeth Intact   Pulmonary former smoker,    Pulmonary exam normal breath sounds clear to auscultation       Cardiovascular hypertension, Pt. on medications and Pt. on home beta blockers  Rhythm:Regular     Neuro/Psych    GI/Hepatic negative GI ROS, Neg liver ROS,   Endo/Other  negative endocrine ROS  Renal/GU negative Renal ROS  negative genitourinary   Musculoskeletal   Abdominal   Peds  Hematology negative hematology ROS (+)   Anesthesia Other Findings ECHO COMPLETE WO IMAGING ENHANCING AGENT  Order# [id number]  Reading physician: Sanda Klein, MD Ordering physician: Belva Crome, MD Study date: 07/21/15 Result Notes for Echocardiogram   Notes Recorded by Lamar Laundry, CMA on 07/24/2015 at 2:59 PM Pt aware of echo results with verbal understanding ------  Notes Recorded by Belva Crome, MD on 07/22/2015 at 5:45 PM The echocardiogram demonstrates that the aortic valve prosthesis is functioning normally. The heart size (thickness ) remains mildly thickened. The heart function is normal. Overall the study is very reassuring.   Study Result   Result status: Final result                          [facility]                        [address], [place] [address]                            [phone]  ------------------------------------------------------------------- Transthoracic Echocardiography  Patient:    Bilaal, Leib MR #:       [id number] Study Date: 07/21/2015 Gender:     M Age:        67 Height:     180.3 cm Weight:     145.2 kg BSA:        2.77 m^2 Pt. Status: Room:   Ivar Bury, MD  SONOGRAPHER  Cindy Hazy, RDCS  ATTENDING    Sanda Klein, MD  PERFORMING   [facility]  cc:  ------------------------------------------------------------------- LV EF: 60% -   65%  ------------------------------------------------------------------- Indications:      I35.9 Aortic Valve Disorder.  ------------------------------------------------------------------- History:   PMH:  Acquired from the patient and from the patient&'s chart.  PMH:  Paroxysmal Atrial Fibrillation. Obstructive Sleep Apnea-CPAP. MI. Murmur. History of Bicuspid Aortic Valve.  Risk factors:  Hypertension.  ------------------------------------------------------------------- Study Conclusions  - Left ventricle: The cavity size was normal. There was moderate   concentric hypertrophy. Systolic function was normal. The   estimated ejection fraction was in the range of 60% to 65%. Wall   motion was normal; there were no regional wall motion   abnormalities. Features are consistent with a pseudonormal left   ventricular filling pattern, with concomitant abnormal relaxation   and increased filling pressure (grade 2 diastolic dysfunction). - Ventricular septum: Septal motion showed paradox. - Aortic valve: A bioprosthesis was present  and functioning   normally. - Aorta: S/P graft repair of the ascending aorta. - Left atrium: The atrium was mildly dilated.  Impressions:  - Prosthetic valve gradients are similar to 2011.  ------------------------------------------------------------------- Labs, prior tests, procedures, and surgery: Status post Aortic Valve Replacement. Status post Bentall Procedure.          Transthoracic echocardiography.  M-mode, complete 2D, spectral Doppler, and color Doppler.  Birthdate: Patient birthdate: 12/28/1950.  Age:  Patient is 67 yr old.  Sex: Gender: male.    BMI: 44.7 kg/m^2.  Blood pressure:     134/84 Patient status:  Outpatient.  Study  date:  Study date: 07/21/2015. Study time: 07:40 AM.  Location:  [facility]  -------------------------------------------------------------------  ------------------------------------------------------------------- Left ventricle:  The cavity size was normal. There was moderate concentric hypertrophy. Systolic function was normal. The estimated ejection fraction was in the range of 60% to 65%. Wall motion was normal; there were no regional wall motion abnormalities. Features are consistent with a pseudonormal left ventricular filling pattern, with concomitant abnormal relaxation and increased filling pressure (grade 2 diastolic dysfunction).  ------------------------------------------------------------------- Aortic valve:  A bioprosthesis was present and functioning normally.  Doppler:  There was no significant regurgitation.    VTI ratio of LVOT to aortic valve: 0.54. Peak velocity ratio of LVOT to aortic valve: 0.45. Mean velocity ratio of LVOT to aortic valve: 0.45.    Mean gradient (S): 15 mm Hg. Peak gradient (S): 24 mm Hg.   ------------------------------------------------------------------- Aorta:  S/P graft repair of the ascending aorta. Aortic root: The aortic root was normal in size. Ascending aorta: The ascending aorta was normal in size.  ------------------------------------------------------------------- Mitral valve:   Structurally normal valve.   Leaflet separation was normal.  Doppler:  Transvalvular velocity was within the normal range. There was no evidence for stenosis. There was trivial regurgitation.    Peak gradient (D): 6 mm Hg.  ------------------------------------------------------------------- Left atrium:  The atrium was mildly dilated.  ------------------------------------------------------------------- Right ventricle:  The cavity size was normal. Systolic function  was normal.  ------------------------------------------------------------------- Ventricular septum:   Septal motion showed paradox.  ------------------------------------------------------------------- Pulmonic valve:   Poorly visualized.  Doppler:  There was no significant regurgitation.  ------------------------------------------------------------------- Tricuspid valve:   Structurally normal valve.   Leaflet separation was normal.  Doppler:  Transvalvular velocity was within the normal range. There was no regurgitation.  ------------------------------------------------------------------- Pulmonary artery:    Systolic pressure could not be accurately estimated.  ------------------------------------------------------------------- Right atrium:  The atrium was normal in size.  ------------------------------------------------------------------- Pericardium:  There was no pericardial effusion.  ------------------------------------------------------------------- Systemic veins: Inferior vena cava: The vessel was normal in size. The respirophasic diameter changes were in the normal range (>= 50%), consistent with normal central venous pressure.  ------------------------------------------------------------------- Post procedure conclusions Ascending Aorta:  - S/P graft repair of the ascending aorta.  ------------------------------------------------------------------- Measurements   Left ventricle                         Value        Reference  LV ID, ED, PLAX chordal                51.6  mm     43 - 52  LV ID, ES, PLAX chordal                26.9  mm     23 - 38  LV fx shortening, PLAX chordal  48    %      >=29  LV PW thickness, ED                    14.8  mm     ---------  IVS/LV PW ratio, ED                    1.01         <=1.3  LV e&', lateral                         9.08  cm/s   ---------  LV E/e&', lateral                       13.22         ---------  LV e&', medial                          7.51  cm/s   ---------  LV E/e&', medial                        15.98        ---------  LV e&', average                         8.3   cm/s   ---------  LV E/e&', average                       14.47        ---------    Ventricular septum                     Value        Reference  IVS thickness, ED                      14.9  mm     ---------    LVOT                                   Value        Reference  LVOT peak velocity, S                  111   cm/s   ---------  LVOT mean velocity, S                  79.9  cm/s   ---------  LVOT VTI, S                            30.1  cm     ---------    Aortic valve                           Value        Reference  Aortic valve peak velocity, S          245   cm/s   ---------  Aortic valve mean velocity, S          179   cm/s   ---------  Aortic valve VTI, S  56.1  cm     ---------  Aortic mean gradient, S                15    mm Hg  ---------  Aortic peak gradient, S                24    mm Hg  ---------  VTI ratio, LVOT/AV                     0.54         ---------  Velocity ratio, peak, LVOT/AV          0.45         ---------  Velocity ratio, mean, LVOT/AV          0.45         ---------    Aorta                                  Value        Reference  Aortic root ID, ED                     35    mm     ---------  Ascending aorta ID, A-P, S             30    mm     ---------    Left atrium                            Value        Reference  LA ID, A-P, ES                         44    mm     ---------  LA ID/bsa, A-P                         1.59  cm/m^2 <=2.2  LA volume, S                           68.4  ml     ---------  LA volume/bsa, S                       24.7  ml/m^2 ---------  LA volume, ES, 1-p A4C                 72.6  ml     ---------  LA volume/bsa, ES, 1-p A4C             26.2  ml/m^2 ---------  LA volume, ES, 1-p A2C                 61.2  ml     ---------  LA  volume/bsa, ES, 1-p A2C             22.1  ml/m^2 ---------    Mitral valve                           Value        Reference  Mitral E-wave peak velocity            120   cm/s   ---------  Mitral A-wave peak velocity            49    cm/s   ---------  Mitral deceleration time       (H)     239   ms     150 - 230  Mitral peak gradient, D                6     mm Hg  ---------  Mitral E/A ratio, peak                 2.4          ---------    Right ventricle                        Value        Reference  TAPSE                                  24.6  mm     ---------  RV s&', lateral, S                      14.9  cm/s   ---------  Legend: (L)  and  (H)  mark values outside specified reference range.  ------------------------------------------------------------------- Prepared and Electronically Authenticated by  Sanda Klein, MD 2017-02-20T11:26:55 [facility] Images   Show images for Echocardiogram Patient Information   Patient Name Kartier, Bennison Sex Male DOB 11-27-1950 SSN [id number] Reason for Exam  Priority: Routine  Other - See Comments Section Dx: S/P aortic valve replacement with bioprosthetic valve (Z95.4 (ICD-10-CM)) Comments: Dx s/p avr Surgical History   Surgical History    Procedure Laterality Date Comment Source CARDIAC CATHETERIZATION    Provider CORONARY ARTERY BYPASS GRAFT  2007  Provider  Other Surgical History    Procedure Laterality Date Comment Source CARDIAC VALVE REPLACEMENT   Bentall procedure, 2008 Provider TONSILLECTOMY   age 74 Provider VASECTOMY    Provider  Performing Technologist/Nurse   Performing Technologist/Nurse: Cresenciano Lick          Implants    No active implants to display in this view. Order-Level Documents:   There are no order-level documents.  Encounter-Level Documents - 07/21/2015:   Electronic signature on 07/21/2015 7:14 AM - Signed  Electronic signature on 07/21/2015 7:13 AM - Signed    Signed    Electronically signed by Sanda Klein, MD on 07/21/15 at 1126 EST Printable Result Report    Result Report  External Result Report    External Result Report  Patient Result Comments   Viewed by [name] on 07/31/2015 10:49 PM  Written by Belva Crome, MD on 07/22/2015 5:45 PM  The echocardiogram demonstrates that the aortic valve prosthesis is functioning normally. The heart size (thickness ) remains mildly thickened. The heart function is normal. Overall the study is very reassuring.    Reproductive/Obstetrics                            Anesthesia Physical Anesthesia Plan  ASA: III  Anesthesia Plan: Spinal   Post-op Pain Management:  Regional for Post-op pain   Induction:   PONV Risk Score and Plan: 1 and Ondansetron  Airway Management Planned: Natural Airway and Nasal Cannula  Additional Equipment:   Intra-op Plan:   Post-operative Plan:  Informed Consent: I have reviewed the patients History and Physical, chart, labs and discussed the procedure including the risks, benefits and alternatives for the proposed anesthesia with the patient or authorized representative who has indicated his/her understanding and acceptance.     Plan Discussed with: CRNA and Surgeon  Anesthesia Plan Comments:        Anesthesia Quick Evaluation

## 2018-03-19 MED ORDER — BUPIVACAINE LIPOSOME 1.3 % IJ SUSP
20.0000 mL | INTRAMUSCULAR | Status: DC
  Filled 2018-03-19: qty 20

## 2018-03-20 ENCOUNTER — Inpatient Hospital Stay (HOSPITAL_COMMUNITY)
Admission: RE | Admit: 2018-03-20 | Discharge: 2018-03-22 | DRG: 470 | Disposition: A | Discharge status: Home / Self Care | Payer: BLUE CROSS/BLUE SHIELD | Attending: Orthopedic Surgery | Admitting: Orthopedic Surgery

## 2018-03-20 ENCOUNTER — Encounter (HOSPITAL_COMMUNITY): Payer: Self-pay | Admitting: *Deleted

## 2018-03-20 ENCOUNTER — Encounter (HOSPITAL_COMMUNITY)
Admission: RE | Disposition: A | Discharge status: Home / Self Care | Payer: Self-pay | Source: Home / Self Care | Attending: Orthopedic Surgery

## 2018-03-20 ENCOUNTER — Inpatient Hospital Stay (HOSPITAL_COMMUNITY): Payer: BLUE CROSS/BLUE SHIELD | Admitting: Anesthesiology

## 2018-03-20 ENCOUNTER — Other Ambulatory Visit: Payer: Self-pay

## 2018-03-20 DIAGNOSIS — G8918 Other acute postprocedural pain: Secondary | ICD-10-CM | POA: Diagnosis not present

## 2018-03-20 DIAGNOSIS — M179 Osteoarthritis of knee, unspecified: Secondary | ICD-10-CM | POA: Diagnosis present

## 2018-03-20 DIAGNOSIS — Z6835 Body mass index (BMI) 35.0-35.9, adult: Secondary | ICD-10-CM | POA: Diagnosis not present

## 2018-03-20 DIAGNOSIS — Z79899 Other long term (current) drug therapy: Secondary | ICD-10-CM

## 2018-03-20 DIAGNOSIS — E782 Mixed hyperlipidemia: Secondary | ICD-10-CM | POA: Diagnosis present

## 2018-03-20 DIAGNOSIS — I959 Hypotension, unspecified: Secondary | ICD-10-CM | POA: Diagnosis not present

## 2018-03-20 DIAGNOSIS — G4730 Sleep apnea, unspecified: Secondary | ICD-10-CM | POA: Diagnosis not present

## 2018-03-20 DIAGNOSIS — Z951 Presence of aortocoronary bypass graft: Secondary | ICD-10-CM

## 2018-03-20 DIAGNOSIS — E861 Hypovolemia: Secondary | ICD-10-CM | POA: Diagnosis not present

## 2018-03-20 DIAGNOSIS — M25562 Pain in left knee: Secondary | ICD-10-CM | POA: Diagnosis not present

## 2018-03-20 DIAGNOSIS — E669 Obesity, unspecified: Secondary | ICD-10-CM | POA: Diagnosis not present

## 2018-03-20 DIAGNOSIS — M1710 Unilateral primary osteoarthritis, unspecified knee: Secondary | ICD-10-CM | POA: Diagnosis present

## 2018-03-20 DIAGNOSIS — Z7982 Long term (current) use of aspirin: Secondary | ICD-10-CM

## 2018-03-20 DIAGNOSIS — I10 Essential (primary) hypertension: Secondary | ICD-10-CM | POA: Diagnosis not present

## 2018-03-20 DIAGNOSIS — M1712 Unilateral primary osteoarthritis, left knee: Secondary | ICD-10-CM | POA: Diagnosis not present

## 2018-03-20 DIAGNOSIS — Z953 Presence of xenogenic heart valve: Secondary | ICD-10-CM | POA: Diagnosis not present

## 2018-03-20 DIAGNOSIS — I252 Old myocardial infarction: Secondary | ICD-10-CM

## 2018-03-20 DIAGNOSIS — Z87891 Personal history of nicotine dependence: Secondary | ICD-10-CM

## 2018-03-20 DIAGNOSIS — I480 Paroxysmal atrial fibrillation: Secondary | ICD-10-CM | POA: Diagnosis not present

## 2018-03-20 DIAGNOSIS — Z888 Allergy status to other drugs, medicaments and biological substances status: Secondary | ICD-10-CM | POA: Diagnosis not present

## 2018-03-20 DIAGNOSIS — I9589 Other hypotension: Secondary | ICD-10-CM | POA: Diagnosis not present

## 2018-03-20 HISTORY — PX: TOTAL KNEE ARTHROPLASTY: SHX125

## 2018-03-20 LAB — TYPE AND SCREEN
ABO/RH(D): O POS
Antibody Screen: NEGATIVE

## 2018-03-20 SURGERY — ARTHROPLASTY, KNEE, TOTAL
Anesthesia: Spinal | Site: Knee | Laterality: Left

## 2018-03-20 MED ORDER — OXYCODONE HCL 5 MG PO TABS
5.0000 mg | ORAL_TABLET | ORAL | Status: DC | PRN
  Administered 2018-03-20 – 2018-03-22 (×9): 10 mg via ORAL
  Filled 2018-03-20 (×9): qty 2

## 2018-03-20 MED ORDER — PROPOFOL 10 MG/ML IV BOLUS
INTRAVENOUS | Status: AC
  Filled 2018-03-20: qty 40

## 2018-03-20 MED ORDER — FENTANYL CITRATE (PF) 100 MCG/2ML IJ SOLN
50.0000 ug | INTRAMUSCULAR | Status: DC
  Administered 2018-03-20 (×2): 50 ug via INTRAVENOUS
  Filled 2018-03-20: qty 2

## 2018-03-20 MED ORDER — LACTATED RINGERS IV SOLN
INTRAVENOUS | Status: DC
  Administered 2018-03-20 (×2): via INTRAVENOUS

## 2018-03-20 MED ORDER — MIDAZOLAM HCL 2 MG/2ML IJ SOLN
INTRAMUSCULAR | Status: AC
  Filled 2018-03-20: qty 2

## 2018-03-20 MED ORDER — ACETAMINOPHEN 500 MG PO TABS
1000.0000 mg | ORAL_TABLET | Freq: Four times a day (QID) | ORAL | Status: AC
  Administered 2018-03-20 – 2018-03-21 (×4): 1000 mg via ORAL
  Filled 2018-03-20 (×4): qty 2

## 2018-03-20 MED ORDER — ATORVASTATIN CALCIUM 20 MG PO TABS
20.0000 mg | ORAL_TABLET | Freq: Every day | ORAL | Status: DC
  Administered 2018-03-21: 20 mg via ORAL
  Filled 2018-03-20: qty 1

## 2018-03-20 MED ORDER — PROPOFOL 500 MG/50ML IV EMUL
INTRAVENOUS | Status: DC | PRN
  Administered 2018-03-20: 75 ug/kg/min via INTRAVENOUS

## 2018-03-20 MED ORDER — MORPHINE SULFATE (PF) 2 MG/ML IV SOLN
1.0000 mg | INTRAVENOUS | Status: DC | PRN
  Administered 2018-03-21: 1 mg via INTRAVENOUS
  Filled 2018-03-20: qty 1

## 2018-03-20 MED ORDER — MENTHOL 3 MG MT LOZG: 1.0000 | LOZENGE | OROMUCOSAL | Status: DC | PRN

## 2018-03-20 MED ORDER — BUPIVACAINE LIPOSOME 1.3 % IJ SUSP
INTRAMUSCULAR | Status: DC | PRN
  Administered 2018-03-20: 20 mL

## 2018-03-20 MED ORDER — CHLORHEXIDINE GLUCONATE 4 % EX LIQD: 60.0000 mL | Freq: Once | CUTANEOUS | Status: DC

## 2018-03-20 MED ORDER — DOCUSATE SODIUM 100 MG PO CAPS
100.0000 mg | ORAL_CAPSULE | Freq: Two times a day (BID) | ORAL | Status: DC
  Administered 2018-03-20 – 2018-03-22 (×4): 100 mg via ORAL
  Filled 2018-03-20 (×4): qty 1

## 2018-03-20 MED ORDER — FLEET ENEMA 7-19 GM/118ML RE ENEM: 1.0000 | ENEMA | Freq: Once | RECTAL | Status: DC | PRN

## 2018-03-20 MED ORDER — ACETAMINOPHEN 10 MG/ML IV SOLN
1000.0000 mg | Freq: Four times a day (QID) | INTRAVENOUS | Status: DC
  Administered 2018-03-20: 1000 mg via INTRAVENOUS
  Filled 2018-03-20: qty 100

## 2018-03-20 MED ORDER — METOPROLOL SUCCINATE ER 50 MG PO TB24: 50.0000 mg | ORAL_TABLET | Freq: Every day | ORAL | Status: DC

## 2018-03-20 MED ORDER — SODIUM CHLORIDE 0.9 % IJ SOLN
INTRAMUSCULAR | Status: DC | PRN
  Administered 2018-03-20: 60 mL via INTRAVENOUS

## 2018-03-20 MED ORDER — TRANEXAMIC ACID-NACL 1000-0.7 MG/100ML-% IV SOLN
1000.0000 mg | INTRAVENOUS | Status: AC
  Administered 2018-03-20: 1000 mg via INTRAVENOUS
  Filled 2018-03-20: qty 100

## 2018-03-20 MED ORDER — DEXAMETHASONE SODIUM PHOSPHATE 10 MG/ML IJ SOLN
8.0000 mg | Freq: Once | INTRAMUSCULAR | Status: AC
  Administered 2018-03-20: 8 mg via INTRAVENOUS

## 2018-03-20 MED ORDER — ROPIVACAINE HCL 7.5 MG/ML IJ SOLN
INTRAMUSCULAR | Status: DC | PRN
  Administered 2018-03-20 (×6): 5 mL via PERINEURAL

## 2018-03-20 MED ORDER — MIDAZOLAM HCL 2 MG/2ML IJ SOLN
1.0000 mg | INTRAMUSCULAR | Status: DC
  Administered 2018-03-20 (×2): 1 mg via INTRAVENOUS
  Filled 2018-03-20: qty 2

## 2018-03-20 MED ORDER — STERILE WATER FOR IRRIGATION IR SOLN
Status: DC | PRN
  Administered 2018-03-20: 2000 mL

## 2018-03-20 MED ORDER — RIVAROXABAN 10 MG PO TABS
10.0000 mg | ORAL_TABLET | Freq: Every day | ORAL | Status: DC
  Administered 2018-03-21 – 2018-03-22 (×2): 10 mg via ORAL
  Filled 2018-03-20 (×2): qty 1

## 2018-03-20 MED ORDER — BUPIVACAINE IN DEXTROSE 0.75-8.25 % IT SOLN
INTRATHECAL | Status: DC | PRN
  Administered 2018-03-20: 1.6 mL via INTRATHECAL

## 2018-03-20 MED ORDER — GABAPENTIN 300 MG PO CAPS
300.0000 mg | ORAL_CAPSULE | Freq: Three times a day (TID) | ORAL | Status: DC
  Administered 2018-03-20 – 2018-03-22 (×6): 300 mg via ORAL
  Filled 2018-03-20 (×6): qty 1

## 2018-03-20 MED ORDER — AMLODIPINE-ATORVASTATIN 10-20 MG PO TABS: 1.0000 | ORAL_TABLET | Freq: Every day | ORAL | Status: DC

## 2018-03-20 MED ORDER — METHOCARBAMOL 500 MG PO TABS
500.0000 mg | ORAL_TABLET | Freq: Four times a day (QID) | ORAL | Status: DC | PRN
  Administered 2018-03-20 – 2018-03-22 (×6): 500 mg via ORAL
  Filled 2018-03-20 (×6): qty 1

## 2018-03-20 MED ORDER — SODIUM CHLORIDE 0.9 % IJ SOLN
INTRAMUSCULAR | Status: AC
  Filled 2018-03-20: qty 10

## 2018-03-20 MED ORDER — ONDANSETRON HCL 4 MG/2ML IJ SOLN: 4.0000 mg | Freq: Four times a day (QID) | INTRAMUSCULAR | Status: DC | PRN

## 2018-03-20 MED ORDER — TRANEXAMIC ACID-NACL 1000-0.7 MG/100ML-% IV SOLN
1000.0000 mg | Freq: Once | INTRAVENOUS | Status: AC
  Administered 2018-03-20: 1000 mg via INTRAVENOUS
  Filled 2018-03-20: qty 100

## 2018-03-20 MED ORDER — BISACODYL 10 MG RE SUPP: 10.0000 mg | Freq: Every day | RECTAL | Status: DC | PRN

## 2018-03-20 MED ORDER — CEFAZOLIN SODIUM-DEXTROSE 2-4 GM/100ML-% IV SOLN
2.0000 g | Freq: Four times a day (QID) | INTRAVENOUS | Status: AC
  Administered 2018-03-20 (×2): 2 g via INTRAVENOUS
  Filled 2018-03-20 (×2): qty 100

## 2018-03-20 MED ORDER — EPHEDRINE 5 MG/ML INJ
INTRAVENOUS | Status: AC
  Filled 2018-03-20: qty 10

## 2018-03-20 MED ORDER — ONDANSETRON HCL 4 MG/2ML IJ SOLN
INTRAMUSCULAR | Status: DC | PRN
  Administered 2018-03-20: 4 mg via INTRAVENOUS

## 2018-03-20 MED ORDER — BUPIVACAINE HCL 0.25 % IJ SOLN
INTRAMUSCULAR | Status: DC | PRN
  Administered 2018-03-20: 30 mL

## 2018-03-20 MED ORDER — METHOCARBAMOL 500 MG IVPB - SIMPLE MED
500.0000 mg | Freq: Four times a day (QID) | INTRAVENOUS | Status: DC | PRN
  Administered 2018-03-20: 500 mg via INTRAVENOUS
  Filled 2018-03-20: qty 50
  Filled 2018-03-20: qty 500

## 2018-03-20 MED ORDER — PHENOL 1.4 % MT LIQD
1.0000 | OROMUCOSAL | Status: DC | PRN
  Filled 2018-03-20: qty 177

## 2018-03-20 MED ORDER — DEXAMETHASONE SODIUM PHOSPHATE 10 MG/ML IJ SOLN
10.0000 mg | Freq: Once | INTRAMUSCULAR | Status: AC
  Administered 2018-03-21: 10 mg via INTRAVENOUS
  Filled 2018-03-20: qty 1

## 2018-03-20 MED ORDER — AMLODIPINE BESYLATE 10 MG PO TABS: 10.0000 mg | ORAL_TABLET | Freq: Every day | ORAL | Status: DC

## 2018-03-20 MED ORDER — GABAPENTIN 300 MG PO CAPS
300.0000 mg | ORAL_CAPSULE | Freq: Once | ORAL | Status: AC
  Administered 2018-03-20: 300 mg via ORAL
  Filled 2018-03-20: qty 1

## 2018-03-20 MED ORDER — DIPHENHYDRAMINE HCL 12.5 MG/5ML PO ELIX: 12.5000 mg | ORAL_SOLUTION | ORAL | Status: DC | PRN

## 2018-03-20 MED ORDER — SODIUM CHLORIDE 0.9 % IJ SOLN
INTRAMUSCULAR | Status: AC
  Filled 2018-03-20: qty 50

## 2018-03-20 MED ORDER — SODIUM CHLORIDE 0.9 % IV SOLN
INTRAVENOUS | Status: DC
  Administered 2018-03-20 – 2018-03-22 (×3): via INTRAVENOUS

## 2018-03-20 MED ORDER — ESCITALOPRAM OXALATE 20 MG PO TABS
20.0000 mg | ORAL_TABLET | Freq: Every day | ORAL | Status: DC
  Administered 2018-03-21 – 2018-03-22 (×2): 20 mg via ORAL
  Filled 2018-03-20 (×2): qty 1

## 2018-03-20 MED ORDER — EPHEDRINE SULFATE 50 MG/ML IJ SOLN
INTRAMUSCULAR | Status: DC | PRN
  Administered 2018-03-20: 5 mg via INTRAVENOUS

## 2018-03-20 MED ORDER — ONDANSETRON HCL 4 MG PO TABS: 4.0000 mg | ORAL_TABLET | Freq: Four times a day (QID) | ORAL | Status: DC | PRN

## 2018-03-20 MED ORDER — METOCLOPRAMIDE HCL 5 MG/ML IJ SOLN: 5.0000 mg | Freq: Three times a day (TID) | INTRAMUSCULAR | Status: DC | PRN

## 2018-03-20 MED ORDER — SODIUM CHLORIDE 0.9 % IR SOLN
Status: DC | PRN
  Administered 2018-03-20: 1000 mL

## 2018-03-20 MED ORDER — BUPIVACAINE HCL (PF) 0.25 % IJ SOLN
INTRAMUSCULAR | Status: AC
  Filled 2018-03-20: qty 30

## 2018-03-20 MED ORDER — CEFAZOLIN SODIUM-DEXTROSE 2-4 GM/100ML-% IV SOLN
2.0000 g | INTRAVENOUS | Status: AC
  Administered 2018-03-20: 2 g via INTRAVENOUS
  Filled 2018-03-20: qty 100

## 2018-03-20 MED ORDER — POLYETHYLENE GLYCOL 3350 17 G PO PACK: 17.0000 g | PACK | Freq: Every day | ORAL | Status: DC | PRN

## 2018-03-20 MED ORDER — METOCLOPRAMIDE HCL 5 MG PO TABS: 5.0000 mg | ORAL_TABLET | Freq: Three times a day (TID) | ORAL | Status: DC | PRN

## 2018-03-20 SURGICAL SUPPLY — 58 items
ATTUNE MED DOME PAT 41 KNEE (Knees) ×2 IMPLANT
ATTUNE PS FEM LT SZ 7 CEM KNEE (Femur) ×2 IMPLANT
ATTUNE PSRP INSR SZ7 10 KNEE (Insert) ×2 IMPLANT
BAG ZIPLOCK 12X15 (MISCELLANEOUS) ×2 IMPLANT
BANDAGE ACE 6X5 VEL STRL LF (GAUZE/BANDAGES/DRESSINGS) ×2 IMPLANT
BANDAGE ELASTIC 6 VELCRO ST LF (GAUZE/BANDAGES/DRESSINGS) ×2 IMPLANT
BASE TIBIAL ROT PLAT SZ 7 KNEE (Knees) ×1 IMPLANT
BLADE SAG 18X100X1.27 (BLADE) ×2 IMPLANT
BLADE SAW SGTL 11.0X1.19X90.0M (BLADE) ×2 IMPLANT
BOWL SMART MIX CTS (DISPOSABLE) ×2 IMPLANT
CEMENT HV SMART SET (Cement) ×4 IMPLANT
COVER SURGICAL LIGHT HANDLE (MISCELLANEOUS) ×2 IMPLANT
COVER WAND RF STERILE (DRAPES) ×2 IMPLANT
CUFF TOURN SGL QUICK 34 (TOURNIQUET CUFF) ×1
CUFF TRNQT CYL 34X4X40X1 (TOURNIQUET CUFF) ×1 IMPLANT
DECANTER SPIKE VIAL GLASS SM (MISCELLANEOUS) ×2 IMPLANT
DRAPE U-SHAPE 47X51 STRL (DRAPES) ×2 IMPLANT
DRSG ADAPTIC 3X8 NADH LF (GAUZE/BANDAGES/DRESSINGS) ×2 IMPLANT
DRSG EMULSION OIL 3X16 NADH (GAUZE/BANDAGES/DRESSINGS) ×2 IMPLANT
DRSG PAD ABDOMINAL 8X10 ST (GAUZE/BANDAGES/DRESSINGS) ×2 IMPLANT
DURAPREP 26ML APPLICATOR (WOUND CARE) ×2 IMPLANT
ELECT REM PT RETURN 15FT ADLT (MISCELLANEOUS) ×2 IMPLANT
EVACUATOR 1/8 PVC DRAIN (DRAIN) ×2 IMPLANT
GAUZE SPONGE 4X4 12PLY STRL (GAUZE/BANDAGES/DRESSINGS) ×2 IMPLANT
GLOVE BIO SURGEON STRL SZ7 (GLOVE) ×2 IMPLANT
GLOVE BIO SURGEON STRL SZ8 (GLOVE) ×2 IMPLANT
GLOVE BIOGEL PI IND STRL 6.5 (GLOVE) ×1 IMPLANT
GLOVE BIOGEL PI IND STRL 7.0 (GLOVE) ×1 IMPLANT
GLOVE BIOGEL PI IND STRL 8 (GLOVE) ×1 IMPLANT
GLOVE BIOGEL PI INDICATOR 6.5 (GLOVE) ×1
GLOVE BIOGEL PI INDICATOR 7.0 (GLOVE) ×1
GLOVE BIOGEL PI INDICATOR 8 (GLOVE) ×1
GLOVE SURG SS PI 6.5 STRL IVOR (GLOVE) ×2 IMPLANT
GOWN STRL REUS W/TWL LRG LVL3 (GOWN DISPOSABLE) ×4 IMPLANT
GOWN STRL REUS W/TWL XL LVL3 (GOWN DISPOSABLE) ×2 IMPLANT
HANDPIECE INTERPULSE COAX TIP (DISPOSABLE) ×1
HOLDER FOLEY CATH W/STRAP (MISCELLANEOUS) IMPLANT
IMMOBILIZER KNEE 20 (SOFTGOODS) ×4 IMPLANT
IMMOBILIZER KNEE 20 THIGH 36 (SOFTGOODS) ×1 IMPLANT
MANIFOLD NEPTUNE II (INSTRUMENTS) ×2 IMPLANT
NS IRRIG 1000ML POUR BTL (IV SOLUTION) ×2 IMPLANT
PACK TOTAL KNEE CUSTOM (KITS) ×2 IMPLANT
PADDING CAST COTTON 6X4 STRL (CAST SUPPLIES) ×2 IMPLANT
PIN STEINMAN FIXATION KNEE (PIN) ×2 IMPLANT
PIN THREADED HEADED SIGMA (PIN) ×2 IMPLANT
POSITIONER SURGICAL ARM (MISCELLANEOUS) ×2 IMPLANT
SET HNDPC FAN SPRY TIP SCT (DISPOSABLE) ×1 IMPLANT
STRIP CLOSURE SKIN 1/2X4 (GAUZE/BANDAGES/DRESSINGS) ×4 IMPLANT
SUT MNCRL AB 4-0 PS2 18 (SUTURE) ×2 IMPLANT
SUT STRATAFIX 0 PDS 27 VIOLET (SUTURE) ×2
SUT VIC AB 2-0 CT1 27 (SUTURE) ×3
SUT VIC AB 2-0 CT1 TAPERPNT 27 (SUTURE) ×3 IMPLANT
SUTURE STRATFX 0 PDS 27 VIOLET (SUTURE) ×1 IMPLANT
TIBIAL BASE ROT PLAT SZ 7 KNEE (Knees) ×2 IMPLANT
TRAY FOLEY MTR SLVR 16FR STAT (SET/KITS/TRAYS/PACK) ×2 IMPLANT
WATER STERILE IRR 1000ML POUR (IV SOLUTION) ×4 IMPLANT
WRAP KNEE MAXI GEL POST OP (GAUZE/BANDAGES/DRESSINGS) ×2 IMPLANT
YANKAUER SUCT BULB TIP 10FT TU (MISCELLANEOUS) ×2 IMPLANT

## 2018-03-20 NOTE — Anesthesia Procedure Notes (Signed)
Spinal  Patient location during procedure: OR Start time: 03/20/2018 8:17 AM End time: 03/20/2018 8:19 AM Staffing Anesthesiologist: Lyn Hollingshead, MD Performed: anesthesiologist  Preanesthetic Checklist Completed: patient identified, site marked, surgical consent, pre-op evaluation, timeout performed, IV checked, risks and benefits discussed and monitors and equipment checked Spinal Block Patient position: sitting Prep: site prepped and draped and DuraPrep Patient monitoring: continuous pulse ox and blood pressure Approach: midline Location: L3-4 Injection technique: single-shot Needle Needle type: Pencan  Needle gauge: 24 G Needle length: 10 cm Needle insertion depth: 6 cm Assessment Sensory level: T8

## 2018-03-20 NOTE — Transfer of Care (Signed)
Immediate Anesthesia Transfer of Care Note  Patient: [name] [name]  Procedure(s) Performed: LEFT TOTAL KNEE ARTHROPLASTY (Left Knee)  Patient Location: PACU  Anesthesia Type:Spinal  Level of Consciousness: awake, alert  and oriented  Airway & Oxygen Therapy: Patient Spontanous Breathing and Patient connected to face mask oxygen  Post-op Assessment: Report given to RN and Post -op Vital signs reviewed and stable  Post vital signs: Reviewed and stable  Last Vitals:  Vitals Value Taken Time  BP    Temp    Pulse    Resp    SpO2      Last Pain:  Vitals:   03/20/18 0809  TempSrc:   PainSc: 0-No pain      Patients Stated Pain Goal: 3 (55/25/89 4834)  Complications: No apparent anesthesia complications

## 2018-03-20 NOTE — Anesthesia Postprocedure Evaluation (Signed)
Anesthesia Post Note  Patient: [name] [name]  Procedure(s) Performed: LEFT TOTAL KNEE ARTHROPLASTY (Left Knee)     Patient location during evaluation: PACU Anesthesia Type: Spinal Level of consciousness: awake Pain management: pain level controlled Vital Signs Assessment: post-procedure vital signs reviewed and stable Respiratory status: spontaneous breathing Cardiovascular status: stable Postop Assessment: no headache, no backache, spinal receding, patient able to bend at knees and no apparent nausea or vomiting Anesthetic complications: no    Last Vitals:  Vitals:   03/20/18 1000 03/20/18 1041  BP: 102/68 112/70  Pulse: (!) 57 (!) 53  Resp: 15 17  Temp: 36.6 C 36.5 C  SpO2: 100% 99%    Last Pain:  Vitals:   03/20/18 1041  TempSrc: Oral  PainSc:    Pain Goal: Patients Stated Pain Goal: 3 (03/20/18 0702)               Huston Foley

## 2018-03-20 NOTE — Anesthesia Procedure Notes (Signed)
Date/Time: 03/20/2018 8:13 AM Performed by: Glory Buff, CRNA Oxygen Delivery Method: Simple face mask

## 2018-03-20 NOTE — Evaluation (Signed)
Physical Therapy Evaluation Patient Details Name: [name] [name] MRN: [id number] DOB: 10-22-1950 Today's Date: 03/20/2018   History of Present Illness  Pt is a 67 YO male s/p L TKR on 03/20/18. PMH includes DOE, PAF, MI, obesity, HTN, sleep apnea. Surgical history includes AAA repair, aortic valve replacement 2008.   Clinical Impression   Pt presents with L knee pain, difficulty performing mobility tasks, decreased L knee ROM, and decreased tolerance to ambulation. Pt to benefit from acute PT to address deficits. Pt ambulated 50 ft with RW with min guard assist. PT to progress mobility as tolerated, and will continue to follow acutely.     Follow Up Recommendations Follow surgeon's recommendation for DC plan and follow-up therapies(HHPT, then OPPT)    Equipment Recommendations  None recommended by PT    Recommendations for Other Services       Precautions / Restrictions Precautions Precautions: Fall Required Braces or Orthoses: Knee Immobilizer - Left Knee Immobilizer - Left: On when out of bed or walking;Discontinue once straight leg raise with < 10 degree lag Restrictions Weight Bearing Restrictions: No      Mobility  Bed Mobility Overal bed mobility: Needs Assistance Bed Mobility: Supine to Sit     Supine to sit: Min assist;HOB elevated     General bed mobility comments: Min assist for LLE management. Verbal cuing for sequencing throughout. Increased time/effort   Transfers Overall transfer level: Needs assistance Equipment used: Rolling walker (2 wheeled) Transfers: Sit to/from Stand Sit to Stand: Min guard;From elevated surface         General transfer comment: Min guard for safety. VC for hand placement.   Ambulation/Gait Ambulation/Gait assistance: Min guard Gait Distance (Feet): 50 Feet Assistive device: Rolling walker (2 wheeled) Gait Pattern/deviations: Step-to pattern;Decreased weight shift to left;Antalgic;Decreased stance time - left;Decreased  stride length Gait velocity: decr    General Gait Details: min guard for safety. Verbal cuing for placement in RW, sequencing, turning, slow down for improved performance of gait.   Stairs            Wheelchair Mobility    Modified Rankin (Stroke Patients Only)       Balance Overall balance assessment: Mild deficits observed, not formally tested                                           Pertinent Vitals/Pain Pain Assessment: 0-10 Pain Score: 2  Pain Location: L knee and L thigh  Pain Descriptors / Indicators: Aching Pain Intervention(s): Monitored during session;Limited activity within patient's tolerance;Ice applied;Repositioned    Home Living Family/patient expects to be discharged to:: Private residence Living Arrangements: Spouse/significant other Available Help at Discharge: Family;Personal care attendant(Aide starting tomorrow at 4:30, will be assisting daytime hours ) Type of Home: House Home Access: Stairs to enter Entrance Stairs-Rails: Right Entrance Stairs-Number of Steps: 4 Home Layout: One level Home Equipment: Shower seat - built in;Bedside commode;Walker - 2 wheels;Cane - single point      Prior Function Level of Independence: Independent with assistive device(s)         Comments: used cane occasionally      Hand Dominance   Dominant Hand: Left    Extremity/Trunk Assessment   Upper Extremity Assessment Upper Extremity Assessment: Overall WFL for tasks assessed    Lower Extremity Assessment Lower Extremity Assessment: Overall WFL for tasks assessed;LLE deficits/detail LLE Deficits / Details:  suspected post-surgical weakness; able to perform quad set, SLR with assist, ankle pumps  LLE Sensation: WNL(decreased sensation on palmar aspect of forefoot, bilaterally )    Cervical / Trunk Assessment Cervical / Trunk Assessment: Normal  Communication   Communication: No difficulties  Cognition Arousal/Alertness:  Awake/alert Behavior During Therapy: WFL for tasks assessed/performed Overall Cognitive Status: Within Functional Limits for tasks assessed                                        General Comments      Exercises Total Joint Exercises Ankle Circles/Pumps: AROM;Both;5 reps;Seated   Assessment/Plan    PT Assessment Patient needs continued PT services  PT Problem List Decreased strength;Pain;Decreased range of motion;Decreased activity tolerance;Decreased knowledge of use of DME;Decreased balance;Decreased safety awareness;Decreased mobility       PT Treatment Interventions DME instruction;Therapeutic activities;Gait training;Therapeutic exercise;Patient/family education;Stair training;Balance training;Functional mobility training    PT Goals (Current goals can be found in the Care Plan section)  Acute Rehab PT Goals PT Goal Formulation: With patient Time For Goal Achievement: 03/27/18 Potential to Achieve Goals: Good    Frequency 7X/week   Barriers to discharge        Co-evaluation               AM-PAC PT "6 Clicks" Daily Activity  Outcome Measure Difficulty turning over in bed (including adjusting bedclothes, sheets and blankets)?: Unable Difficulty moving from lying on back to sitting on the side of the bed? : Unable Difficulty sitting down on and standing up from a chair with arms (e.g., wheelchair, bedside commode, etc,.)?: Unable Help needed moving to and from a bed to chair (including a wheelchair)?: A Little Help needed walking in hospital room?: A Little Help needed climbing 3-5 steps with a railing? : A Little 6 Click Score: 12    End of Session Equipment Utilized During Treatment: Gait belt;Left knee immobilizer Activity Tolerance: Patient tolerated treatment well Patient left: in chair;with chair alarm set;with call bell/phone within reach;with family/visitor present Nurse Communication: Mobility status PT Visit Diagnosis: Difficulty in  walking, not elsewhere classified (R26.2);Other abnormalities of gait and mobility (R26.89)    Time: 1430-1505 PT Time Calculation (min) (ACUTE ONLY): 35 min   Charges:   PT Evaluation $PT Eval Low Complexity: 1 Low PT Treatments $Gait Training: 8-22 mins       Julien Girt, PT Acute Rehabilitation Services Pager [phone]  Office [phone]   Surena Welge D Aarish Rockers 03/20/2018, 4:03 PM

## 2018-03-20 NOTE — Discharge Instructions (Addendum)
° °Dr. Frank Aluisio °Total Joint Specialist °[facility] °[address] °[place], [place] [address] °[phone] ° °TOTAL KNEE REPLACEMENT POSTOPERATIVE DIRECTIONS ° °Knee Rehabilitation, Guidelines Following Surgery  °Results after knee surgery are often greatly improved when you follow the exercise, range of motion and muscle strengthening exercises prescribed by your doctor. Safety measures are also important to protect the knee from further injury. Any time any of these exercises cause you to have increased pain or swelling in your knee joint, decrease the amount until you are comfortable again and slowly increase them. If you have problems or questions, call your caregiver or physical therapist for advice.  ° °HOME CARE INSTRUCTIONS  °Remove items at home which could result in a fall. This includes throw rugs or furniture in walking pathways.  °· ICE to the affected knee every three hours for 30 minutes at a time and then as needed for pain and swelling.  Continue to use ice on the knee for pain and swelling from surgery. You may notice swelling that will progress down to the foot and ankle.  This is normal after surgery.  Elevate the leg when you are not up walking on it.   °· Continue to use the breathing machine which will help keep your temperature down.  It is common for your temperature to cycle up and down following surgery, especially at night when you are not up moving around and exerting yourself.  The breathing machine keeps your lungs expanded and your temperature down. °· Do not place pillow under knee, focus on keeping the knee straight while resting ° °DIET °You may resume your previous home diet once your are discharged from the hospital. ° °DRESSING / WOUND CARE / SHOWERING °You may shower 3 days after surgery, but keep the wounds dry during showering.  You may use an occlusive plastic wrap (Press'n Seal for example), NO SOAKING/SUBMERGING IN THE BATHTUB.  If the bandage gets  wet, change with a clean dry gauze.  If the incision gets wet, pat the wound dry with a clean towel. °You may start showering once you are discharged home but do not submerge the incision under water. Just pat the incision dry and apply a dry gauze dressing on daily. °Change the surgical dressing daily and reapply a dry dressing each time. ° °ACTIVITY °Walk with your walker as instructed. °Use walker as long as suggested by your caregivers. °Avoid periods of inactivity such as sitting longer than an hour when not asleep. This helps prevent blood clots.  °You may resume a sexual relationship in one month or when given the OK by your doctor.  °You may return to work once you are cleared by your doctor.  °Do not drive a car for 6 weeks or until released by you surgeon.  °Do not drive while taking narcotics. ° °WEIGHT BEARING °Weight bearing as tolerated with assist device (walker, cane, etc) as directed, use it as long as suggested by your surgeon or therapist, typically at least 4-6 weeks. ° °POSTOPERATIVE CONSTIPATION PROTOCOL °Constipation - defined medically as fewer than three stools per week and severe constipation as less than one stool per week. ° °One of the most common issues patients have following surgery is constipation.  Even if you have a regular bowel pattern at home, your normal regimen is likely to be disrupted due to multiple reasons following surgery.  Combination of anesthesia, postoperative narcotics, change in appetite and fluid intake all can affect your bowels.    In order to avoid complications following surgery, here are some recommendations in order to help you during your recovery period. ° °Colace (docusate) - Pick up an over-the-counter form of Colace or another stool softener and take twice a day as long as you are requiring postoperative pain medications.  Take with a full glass of water daily.  If you experience loose stools or diarrhea, hold the colace until you stool forms back up.  If  your symptoms do not get better within 1 week or if they get worse, check with your doctor. ° °Dulcolax (bisacodyl) - Pick up over-the-counter and take as directed by the product packaging as needed to assist with the movement of your bowels.  Take with a full glass of water.  Use this product as needed if not relieved by Colace only.  ° °MiraLax (polyethylene glycol) - Pick up over-the-counter to have on hand.  MiraLax is a solution that will increase the amount of water in your bowels to assist with bowel movements.  Take as directed and can mix with a glass of water, juice, soda, coffee, or tea.  Take if you go more than two days without a movement. °Do not use MiraLax more than once per day. Call your doctor if you are still constipated or irregular after using this medication for 7 days in a row. ° °If you continue to have problems with postoperative constipation, please contact the office for further assistance and recommendations.  If you experience "the worst abdominal pain ever" or develop nausea or vomiting, please contact the office immediatly for further recommendations for treatment. ° °ITCHING ° If you experience itching with your medications, try taking only a single pain pill, or even half a pain pill at a time.  You can also use Benadryl over the counter for itching or also to help with sleep.  ° °TED HOSE STOCKINGS °Wear the elastic stockings on both legs for three weeks following surgery during the day but you may remove then at night for sleeping. ° °MEDICATIONS °See your medication summary on the “After Visit Summary” that the nursing staff will review with you prior to discharge.  You may have some home medications which will be placed on hold until you complete the course of blood thinner medication.  It is important for you to complete the blood thinner medication as prescribed by your surgeon.  Continue your approved medications as instructed at time of discharge. ° °PRECAUTIONS °If you  experience chest pain or shortness of breath - call 911 immediately for transfer to the hospital emergency department.  °If you develop a fever greater that 101 F, purulent drainage from wound, increased redness or drainage from wound, foul odor from the wound/dressing, or calf pain - CONTACT YOUR SURGEON.   °                                                °FOLLOW-UP APPOINTMENTS °Make sure you keep all of your appointments after your operation with your surgeon and caregivers. You should call the office at the above phone number and make an appointment for approximately two weeks after the date of your surgery or on the date instructed by your surgeon outlined in the "After Visit Summary". ° ° °RANGE OF MOTION AND STRENGTHENING EXERCISES  °Rehabilitation of the knee is important following a knee injury or   an operation. After just a few days of immobilization, the muscles of the thigh which control the knee become weakened and shrink (atrophy). Knee exercises are designed to build up the tone and strength of the thigh muscles and to improve knee motion. Often times heat used for twenty to thirty minutes before working out will loosen up your tissues and help with improving the range of motion but do not use heat for the first two weeks following surgery. These exercises can be done on a training (exercise) mat, on the floor, on a table or on a bed. Use what ever works the best and is most comfortable for you Knee exercises include:  °Leg Lifts - While your knee is still immobilized in a splint or cast, you can do straight leg raises. Lift the leg to 60 degrees, hold for 3 sec, and slowly lower the leg. Repeat 10-20 times 2-3 times daily. Perform this exercise against resistance later as your knee gets better.  °Quad and Hamstring Sets - Tighten up the muscle on the front of the thigh (Quad) and hold for 5-10 sec. Repeat this 10-20 times hourly. Hamstring sets are done by pushing the foot backward against an object  and holding for 5-10 sec. Repeat as with quad sets.  °· Leg Slides: Lying on your back, slowly slide your foot toward your buttocks, bending your knee up off the floor (only go as far as is comfortable). Then slowly slide your foot back down until your leg is flat on the floor again. °· Angel Wings: Lying on your back spread your legs to the side as far apart as you can without causing discomfort.  °A rehabilitation program following serious knee injuries can speed recovery and prevent re-injury in the future due to weakened muscles. Contact your doctor or a physical therapist for more information on knee rehabilitation.  ° °IF YOU ARE TRANSFERRED TO A SKILLED REHAB FACILITY °If the patient is transferred to a skilled rehab facility following release from the hospital, a list of the current medications will be sent to the facility for the patient to continue.  When discharged from the skilled rehab facility, please have the facility set up the patient's Home Health Physical Therapy prior to being released. Also, the skilled facility will be responsible for providing the patient with their medications at time of release from the facility to include their pain medication, the muscle relaxants, and their blood thinner medication. If the patient is still at the rehab facility at time of the two week follow up appointment, the skilled rehab facility will also need to assist the patient in arranging follow up appointment in our office and any transportation needs. ° °MAKE SURE YOU:  °Understand these instructions.  °Get help right away if you are not doing well or get worse.  ° ° °Pick up stool softner and laxative for home use following surgery while on pain medications. °Do not submerge incision under water. °Please use good hand washing techniques while changing dressing each day. °May shower starting three days after surgery. °Please use a clean towel to pat the incision dry following showers. °Continue to use ice for  pain and swelling after surgery. °Do not use any lotions or creams on the incision until instructed by your surgeon. ° °Information on my medicine - XARELTO® (Rivaroxaban) ° ° ° °Why was Xarelto® prescribed for you? °Xarelto® was prescribed for you to reduce the risk of blood clots forming after orthopedic surgery. The medical term for   these abnormal blood clots is venous thromboembolism (VTE). ° °What do you need to know about xarelto® ? °Take your Xarelto® ONCE DAILY at the same time every day. °You may take it either with or without food. ° °If you have difficulty swallowing the tablet whole, you may crush it and mix in applesauce just prior to taking your dose. ° °Take Xarelto® exactly as prescribed by your doctor and DO NOT stop taking Xarelto® without talking to the doctor who prescribed the medication.  Stopping without other VTE prevention medication to take the place of Xarelto® may increase your risk of developing a clot. ° °After discharge, you should have regular check-up appointments with your healthcare provider that is prescribing your Xarelto®.   ° °What do you do if you miss a dose? °If you miss a dose, take it as soon as you remember on the same day then continue your regularly scheduled once daily regimen the next day. Do not take two doses of Xarelto® on the same day.  ° °Important Safety Information °A possible side effect of Xarelto® is bleeding. You should call your healthcare provider right away if you experience any of the following: °? Bleeding from an injury or your nose that does not stop. °? Unusual colored urine (red or dark brown) or unusual colored stools (red or black). °? Unusual bruising for unknown reasons. °? A serious fall or if you hit your head (even if there is no bleeding). ° °Some medicines may interact with Xarelto® and might increase your risk of bleeding while on Xarelto®. To help avoid this, consult your healthcare provider or pharmacist prior to using any new  prescription or non-prescription medications, including herbals, vitamins, non-steroidal anti-inflammatory drugs (NSAIDs) and supplements. ° °This website has more information on Xarelto®: www.xarelto.com. ° ° °

## 2018-03-20 NOTE — Progress Notes (Signed)
Assisted Dr. Hatchett with left, ultrasound guided, adductor canal block. Side rails up, monitors on throughout procedure. See vital signs in flow sheet. Tolerated Procedure well. 

## 2018-03-20 NOTE — Op Note (Signed)
OPERATIVE REPORT-TOTAL KNEE ARTHROPLASTY   Pre-operative diagnosis- Osteoarthritis  Left knee(s)  Post-operative diagnosis- Osteoarthritis Left knee(s)  Procedure-  Left  Total Knee Arthroplasty (Depuy Attune)  Surgeon- Dione Plover. Lord Lancour, MD  Assistant- Ardeen Jourdain, PA-C   Anesthesia-  Adductor canal block and spinal  EBL-25 mL   Drains Hemovac  Tourniquet time- 35 minutes @ 893 mm Hg   Complications- None  Condition-PACU - hemodynamically stable.   Brief Clinical Note   [name] [name] is a 67 y.o. year old male with end stage OA of his left knee with progressively worsening pain and dysfunction. He has constant pain, with activity and at rest and significant functional deficits with difficulties even with ADLs. He has had extensive non-op management including analgesics, injections of cortisone, and home exercise program, but remains in significant pain with significant dysfunction. Radiographs show bone on bone arthritis medial and patellofemoral. He presents now for left Total Knee Arthroplasty.     Procedure in detail---   The patient is brought into the operating room and positioned supine on the operating table. After successful administration of  Adductor canal block and spinal,   a tourniquet is placed high on the  Left thigh(s) and the lower extremity is prepped and draped in the usual sterile fashion. Time out is performed by the operating team and then the  Left lower extremity is wrapped in Esmarch, knee flexed and the tourniquet inflated to 300 mmHg.       A midline incision is made with a ten blade through the subcutaneous tissue to the level of the extensor mechanism. A fresh blade is used to make a medial parapatellar arthrotomy. Soft tissue over the proximal medial tibia is subperiosteally elevated to the joint line with a knife and into the semimembranosus bursa with a Cobb elevator. Soft tissue over the proximal lateral tibia is elevated with attention being  paid to avoiding the patellar tendon on the tibial tubercle. The patella is everted, knee flexed 90 degrees and the ACL and PCL are removed. Findings are bone on bone medial and patellofemoral with large global osteophytes.        The drill is used to create a starting hole in the distal femur and the canal is thoroughly irrigated with sterile saline to remove the fatty contents. The 5 degree Left  valgus alignment guide is placed into the femoral canal and the distal femoral cutting block is pinned to remove 9 mm off the distal femur. Resection is made with an oscillating saw.      The tibia is subluxed forward and the menisci are removed. The extramedullary alignment guide is placed referencing proximally at the medial aspect of the tibial tubercle and distally along the second metatarsal axis and tibial crest. The block is pinned to remove 9mm off the more deficient medial  side. Resection is made with an oscillating saw. Size 7is the most appropriate size for the tibia and the proximal tibia is prepared with the modular drill and keel punch for that size.      The femoral sizing guide is placed and size 7 is most appropriate. Rotation is marked off the epicondylar axis and confirmed by creating a rectangular flexion gap at 90 degrees. The size 7 cutting block is pinned in this rotation and the anterior, posterior and chamfer cuts are made with the oscillating saw. The intercondylar block is then placed and that cut is made.      Trial size 7 tibial component, trial  size 7 posterior stabilized femur and a 10  mm posterior stabilized rotating platform insert trial is placed. Full extension is achieved with excellent varus/valgus and anterior/posterior balance throughout full range of motion. The patella is everted and thickness measured to be 27  mm. Free hand resection is taken to 15 mm, a 41 template is placed, lug holes are drilled, trial patella is placed, and it tracks normally. Osteophytes are removed  off the posterior femur with the trial in place. All trials are removed and the cut bone surfaces prepared with pulsatile lavage. Cement is mixed and once ready for implantation, the size 7 tibial implant, size  7 posterior stabilized femoral component, and the size 41 patella are cemented in place and the patella is held with the clamp. The trial insert is placed and the knee held in full extension. The Exparel (20 ml mixed with 60 ml saline) is injected into the extensor mechanism, posterior capsule, medial and lateral gutters and subcutaneous tissues.  All extruded cement is removed and once the cement is hard the permanent 10 mm posterior stabilized rotating platform insert is placed into the tibial tray.      The wound is copiously irrigated with saline solution and the extensor mechanism closed over a hemovac drain with #1 V-loc suture. The tourniquet is released for a total tourniquet time of 35  minutes. Flexion against gravity is 140 degrees and the patella tracks normally. Subcutaneous tissue is closed with 2.0 vicryl and subcuticular with running 4.0 Monocryl. The incision is cleaned and dried and steri-strips and a bulky sterile dressing are applied. The limb is placed into a knee immobilizer and the patient is awakened and transported to recovery in stable condition.      Please note that a surgical assistant was a medical necessity for this procedure in order to perform it in a safe and expeditious manner. Surgical assistant was necessary to retract the ligaments and vital neurovascular structures to prevent injury to them and also necessary for proper positioning of the limb to allow for anatomic placement of the prosthesis.   Dione Plover Adlai Sinning, MD    03/20/2018, 9:25 AM

## 2018-03-20 NOTE — Plan of Care (Signed)
Plan of care reviewed and discussed with patient and spouse. 

## 2018-03-20 NOTE — Anesthesia Procedure Notes (Signed)
Anesthesia Regional Block: Adductor canal block   Pre-Anesthetic Checklist: ,, timeout performed, Correct Patient, Correct Site, Correct Laterality, Correct Procedure, Correct Position, site marked, Risks and benefits discussed,  Surgical consent,  Pre-op evaluation,  At surgeon's request and post-op pain management  Laterality: Left and Lower  Prep: chloraprep       Needles:  Injection technique: Single-shot  Needle Type: Echogenic Stimulator Needle     Needle Length: 10cm  Needle Gauge: 21   Needle insertion depth: 3 cm   Additional Needles:   Procedures:,,,, ultrasound used (permanent image in chart),,,,  Narrative:  Start time: 03/20/2018 8:00 AM End time: 03/20/2018 8:10 AM

## 2018-03-20 NOTE — Interval H&P Note (Signed)
History and Physical Interval Note:  03/20/2018 6:27 AM  [name] [name]  has presented today for surgery, with the diagnosis of Osteoarthritis left knee  The various methods of treatment have been discussed with the patient and family. After consideration of risks, benefits and other options for treatment, the patient has consented to  Procedure(s): LEFT TOTAL KNEE ARTHROPLASTY (Left) as a surgical intervention .  The patient's history has been reviewed, patient examined, no change in status, stable for surgery.  I have reviewed the patient's chart and labs.  Questions were answered to the patient's satisfaction.     Pilar Plate Maryna Yeagle

## 2018-03-21 ENCOUNTER — Encounter (HOSPITAL_COMMUNITY): Payer: Self-pay | Admitting: Orthopedic Surgery

## 2018-03-21 LAB — TROPONIN I: Troponin I: <0.03 ng/mL (ref <0.03)

## 2018-03-21 LAB — BASIC METABOLIC PANEL
Anion gap: 8 (ref 5–15)
BUN: 24 mg/dL — AB (ref 8–23)
CO2: 27 mmol/L (ref 22–32)
CREATININE: 1.07 mg/dL (ref 0.61–1.24)
Calcium: 9 mg/dL (ref 8.9–10.3)
Chloride: 103 mmol/L (ref 98–111)
GFR calc Af Amer: >60 mL/min (ref >60)
GLUCOSE: 127 mg/dL — AB (ref 70–99)
Potassium: 4.6 mmol/L (ref 3.5–5.1)
SODIUM: 138 mmol/L (ref 135–145)

## 2018-03-21 LAB — CBC
HCT: 42.4 % (ref 39.0–52.0)
Hemoglobin: 13.5 g/dL (ref 13.0–17.0)
MCH: 30.3 pg (ref 26.0–34.0)
MCHC: 31.8 g/dL (ref 30.0–36.0)
MCV: 95.3 fL (ref 80.0–100.0)
PLATELETS: 224 10*3/uL (ref 150–400)
RBC: 4.45 MIL/uL (ref 4.22–5.81)
RDW: 13.3 % (ref 11.5–15.5)
WBC: 11.8 10*3/uL — ABNORMAL HIGH (ref 4.0–10.5)
nRBC: 0 % (ref 0.0–0.2)

## 2018-03-21 MED ORDER — OXYCODONE HCL 5 MG PO TABS: 5.0000 mg | ORAL_TABLET | Freq: Four times a day (QID) | ORAL | 0 refills | Status: DC | PRN

## 2018-03-21 MED ORDER — GABAPENTIN 300 MG PO CAPS: 300.0000 mg | ORAL_CAPSULE | Freq: Three times a day (TID) | ORAL | 0 refills | Status: DC

## 2018-03-21 MED ORDER — SODIUM CHLORIDE 0.9 % IV BOLUS
500.0000 mL | Freq: Once | INTRAVENOUS | Status: AC
  Administered 2018-03-21: 500 mL via INTRAVENOUS

## 2018-03-21 MED ORDER — SODIUM CHLORIDE 0.9 % IV BOLUS: 500.0000 mL | Freq: Once | INTRAVENOUS | Status: AC

## 2018-03-21 MED ORDER — METHOCARBAMOL 500 MG PO TABS: 500.0000 mg | ORAL_TABLET | Freq: Four times a day (QID) | ORAL | 0 refills | Status: DC | PRN

## 2018-03-21 MED ORDER — SODIUM CHLORIDE 0.9 % IV BOLUS: 500.0000 mL | Freq: Once | INTRAVENOUS | Status: DC

## 2018-03-21 MED ORDER — RIVAROXABAN 10 MG PO TABS: 10.0000 mg | ORAL_TABLET | Freq: Every day | ORAL | 0 refills | Status: DC

## 2018-03-21 NOTE — Progress Notes (Signed)
Physical Therapy Treatment Patient Details Name: [name] [name] MRN: [id number] DOB: 07-24-1950 Today's Date: 03/21/2018    History of Present Illness Pt is a 67 YO male s/p L TKR on 03/20/18. PMH includes DOE, PAF, MI, obesity, HTN, sleep apnea. Surgical history includes AAA repair, aortic valve replacement 2008.     PT Comments    Pt ambulated in hallway and able to progress distance however became dizzy and diaphoretic upon returning to recliner in room.  BP 82/58 mmHg and RN notified.   Follow Up Recommendations  Follow surgeon's recommendation for DC plan and follow-up therapies     Equipment Recommendations  None recommended by PT    Recommendations for Other Services       Precautions / Restrictions Precautions Precautions: Fall Precaution Comments: pt able to perform SLR Required Braces or Orthoses: Knee Immobilizer - Left Knee Immobilizer - Left: On when out of bed or walking;Discontinue once straight leg raise with < 10 degree lag    Mobility  Bed Mobility Overal bed mobility: Needs Assistance Bed Mobility: Supine to Sit     Supine to sit: Min guard     General bed mobility comments: verbal cues for self assist  Transfers Overall transfer level: Needs assistance Equipment used: Rolling walker (2 wheeled) Transfers: Sit to/from Stand Sit to Stand: Min assist         General transfer comment: assist to rise and steady, verbal cues for UE and LE positioning  Ambulation/Gait Ambulation/Gait assistance: Min guard Gait Distance (Feet): 60 Feet Assistive device: Rolling walker (2 wheeled) Gait Pattern/deviations: Step-to pattern;Antalgic;Decreased stance time - left     General Gait Details: verbal cues for sequence, RW positioning, step length; pt began feeling dizzy and diaphoretic upon returning to recliner; BP 82/58 mmHg (RN notified), once reclined a few minutes BP 117/67 mmHg   Stairs             Wheelchair Mobility    Modified Rankin  (Stroke Patients Only)       Balance                                            Cognition Arousal/Alertness: Awake/alert Behavior During Therapy: WFL for tasks assessed/performed Overall Cognitive Status: Within Functional Limits for tasks assessed                                        Exercises     General Comments        Pertinent Vitals/Pain Pain Assessment: 0-10 Pain Score: 4  Pain Location:  L thigh  Pain Descriptors / Indicators: Aching Pain Intervention(s): Limited activity within patient's tolerance;Repositioned;Monitored during session    Home Living                      Prior Function            PT Goals (current goals can now be found in the care plan section) Progress towards PT goals: Progressing toward goals    Frequency    7X/week      PT Plan Current plan remains appropriate    Co-evaluation              AM-PAC PT "6 Clicks" Daily Activity  Outcome Measure  Difficulty turning over  in bed (including adjusting bedclothes, sheets and blankets)?: A Lot Difficulty moving from lying on back to sitting on the side of the bed? : A Lot Difficulty sitting down on and standing up from a chair with arms (e.g., wheelchair, bedside commode, etc,.)?: Unable Help needed moving to and from a bed to chair (including a wheelchair)?: A Little Help needed walking in hospital room?: A Little Help needed climbing 3-5 steps with a railing? : A Lot 6 Click Score: 13    End of Session Equipment Utilized During Treatment: Gait belt Activity Tolerance: Treatment limited secondary to medical complications (Comment) Patient left: in chair;with call bell/phone within reach;with family/visitor present Nurse Communication: Mobility status(hypotension) PT Visit Diagnosis: Difficulty in walking, not elsewhere classified (R26.2);Other abnormalities of gait and mobility (R26.89)     Time: 9122-5834 PT Time Calculation  (min) (ACUTE ONLY): 22 min  Charges:  $Gait Training: 8-22 mins                      Carmelia Bake, PT, DPT Acute Rehabilitation Services Office: [phone] Pager: [phone]  Trena Platt 03/21/2018, 3:49 PM

## 2018-03-21 NOTE — Progress Notes (Signed)
[name] [name] had a hypotensive episode this AM while working with therapy, alerted by RN Benedetto Goad. BP dropped to 75/46 mmHg and pulse 47 bpm. Improved with sitting. 500 mL bolus ordered and Norvasc and Metoprolol held. May need to keep one additional night depending on progress.

## 2018-03-21 NOTE — Care Management Note (Signed)
Case Management Note  Patient Details  Name: [name] [name] MRN: [id number] Date of Birth: 11-Jun-1950  Subjective/Objective:   Spoke with patient and spouse at bedside. They state they spoke with Dr. Maureen Ralphs and now plan for OP PT. Has RW and 3n1. [phone]                 Action/Plan: No needs  Expected Discharge Date:  03/21/18               Expected Discharge Plan:  OP Rehab  In-House Referral:  NA  Discharge planning Services  NA  Post Acute Care Choice:  NA Choice offered to:  Patient, Spouse  DME Arranged:  N/A DME Agency:  NA  HH Arranged:  NA HH Agency:  NA  Status of Service:  Completed, signed off  If discussed at [place] of Stay Meetings, dates discussed:    Additional Comments:  Guadalupe Maple, RN 03/21/2018, 9:48 AM

## 2018-03-21 NOTE — Progress Notes (Signed)
Physical Therapy Treatment Patient Details Name: [name] [name] MRN: [id number] DOB: 25-Oct-1950 Today's Date: 03/21/2018    History of Present Illness Pt is a 67 YO male s/p L TKR on 03/20/18. PMH includes DOE, PAF, MI, obesity, HTN, sleep apnea. Surgical history includes AAA repair, aortic valve replacement 2008.     PT Comments    Pt performed LE exercises and ambulated short distance in hallway.  Ambulation limited by dizziness and BP low so RN notified.   Follow Up Recommendations  Follow surgeon's recommendation for DC plan and follow-up therapies     Equipment Recommendations  None recommended by PT    Recommendations for Other Services       Precautions / Restrictions Precautions Precautions: Fall Required Braces or Orthoses: Knee Immobilizer - Left Knee Immobilizer - Left: On when out of bed or walking;Discontinue once straight leg raise with < 10 degree lag    Mobility  Bed Mobility Overal bed mobility: Needs Assistance Bed Mobility: Supine to Sit     Supine to sit: Min guard     General bed mobility comments: verbal cues for self assist  Transfers Overall transfer level: Needs assistance Equipment used: Rolling walker (2 wheeled) Transfers: Sit to/from Stand Sit to Stand: Min assist         General transfer comment: assist to rise and steady as well as control descent, verbal cues for UE and LE positioning  Ambulation/Gait Ambulation/Gait assistance: Min assist Gait Distance (Feet): 25 Feet Assistive device: Rolling walker (2 wheeled) Gait Pattern/deviations: Step-to pattern;Antalgic;Decreased stance time - left     General Gait Details: verbal cues for sequence, RW positioning, step length; pt began feeling dizzy and required more assist so recliner brought behind pt to sit down; BP and in docflowsheets - very low (RN notified), once reclined a few minutes BP 105/56 mmHg; pt taken back to room in Art gallery manager    Modified Rankin (Stroke Patients Only)       Balance                                            Cognition Arousal/Alertness: Awake/alert Behavior During Therapy: WFL for tasks assessed/performed Overall Cognitive Status: Within Functional Limits for tasks assessed                                        Exercises Total Joint Exercises Ankle Circles/Pumps: AROM;10 reps;Both Quad Sets: AROM;10 reps;Both Short Arc Quad: AROM;10 reps;Left Heel Slides: AROM;10 reps;Left Hip ABduction/ADduction: Left;AROM;10 reps Straight Leg Raises: AROM;10 reps;Left Goniometric ROM: L knee AAROM approx -8-65*    General Comments        Pertinent Vitals/Pain Pain Assessment: 0-10 Pain Score: 4  Pain Location: L knee and L thigh  Pain Descriptors / Indicators: Aching Pain Intervention(s): Limited activity within patient's tolerance;Monitored during session;Repositioned;Ice applied    Home Living                      Prior Function            PT Goals (current goals can now be found in the care plan section) Progress towards PT goals: Progressing toward goals  Frequency    7X/week      PT Plan Current plan remains appropriate    Co-evaluation              AM-PAC PT "6 Clicks" Daily Activity  Outcome Measure  Difficulty turning over in bed (including adjusting bedclothes, sheets and blankets)?: A Lot Difficulty moving from lying on back to sitting on the side of the bed? : A Lot Difficulty sitting down on and standing up from a chair with arms (e.g., wheelchair, bedside commode, etc,.)?: Unable Help needed moving to and from a bed to chair (including a wheelchair)?: A Little Help needed walking in hospital room?: A Lot Help needed climbing 3-5 steps with a railing? : Total 6 Click Score: 11    End of Session Equipment Utilized During Treatment: Gait belt;Left knee immobilizer Activity Tolerance: Treatment  limited secondary to medical complications (Comment) Patient left: in chair;with nursing/sitter in room;with call bell/phone within reach;with family/visitor present Nurse Communication: Mobility status(hypotension) PT Visit Diagnosis: Difficulty in walking, not elsewhere classified (R26.2);Other abnormalities of gait and mobility (R26.89)     Time: 3748-2707 PT Time Calculation (min) (ACUTE ONLY): 25 min  Charges:  $Gait Training: 8-22 mins $Therapeutic Exercise: 8-22 mins                     Carmelia Bake, PT, DPT Acute Rehabilitation Services Office: [phone] Pager: [phone]  Trena Platt 03/21/2018, 1:00 PM

## 2018-03-21 NOTE — Progress Notes (Signed)
   Subjective: 1 Day Post-Op Procedure(s) (LRB): LEFT TOTAL KNEE ARTHROPLASTY (Left) Patient reports pain as mild.   Patient seen in rounds by Dr. Wynelle Link. Patient is well, and has had no acute complaints or problems other than pain in the left knee. No issues overnight. Foley catheter removed this AM. Denies chest pain, SOB, or calf pain. Did well ambulating with therapy yesterday, will continue working with PT today.   Objective: Vital signs in last 24 hours: Temp:  [97.6 F (36.4 C)-98.2 F (36.8 C)] 97.9 F (36.6 C) (10/22 0505) Pulse Rate:  [45-57] 45 (10/22 0505) Resp:  [9-20] 17 (10/22 0505) BP: (102-166)/(62-98) 124/69 (10/22 0505) SpO2:  [96 %-100 %] 100 % (10/22 0505)  Intake/Output from previous day:  Intake/Output Summary (Last 24 hours) at 03/21/2018 0704 Last data filed at 03/21/2018 0600 Gross per 24 hour  Intake 4274.24 ml  Output 3020 ml  Net 1254.24 ml    Labs: Recent Labs    03/21/18 0428  HGB 13.5   Recent Labs    03/21/18 0428  WBC 11.8*  RBC 4.45  HCT 42.4  PLT 224   Recent Labs    03/21/18 0428  NA 138  K 4.6  CL 103  CO2 27  BUN 24*  CREATININE 1.07  GLUCOSE 127*  CALCIUM 9.0   Exam: General - Patient is Alert and Oriented Extremity - Neurologically intact Neurovascular intact Sensation intact distally Dorsiflexion/Plantar flexion intact Dressing - dressing C/D/I Motor Function - intact, moving foot and toes well on exam.   Past Medical History:  Diagnosis Date  . Bicuspid aortic valve    [place] procedure 2008  . Heart attack ([place])   . Hypertension     Assessment/Plan: 1 Day Post-Op Procedure(s) (LRB): LEFT TOTAL KNEE ARTHROPLASTY (Left) Principal Problem:   Osteoarthritis of knee Active Problems:   OA (osteoarthritis) of knee  Estimated body mass index is 35.23 kg/m as calculated from the following:   Height as of this encounter: 5\' 10"  (1.778 m).   Weight as of this encounter: 111.4 kg. Advance diet Up with  therapy D/C IV fluids  Anticipated LOS equal to or greater than 2 midnights due to - Age 12 and older with one or more of the following:  - Obesity  - Expected need for hospital services (PT, OT, Nursing) required for safe  discharge  - Anticipated need for postoperative skilled nursing care or inpatient rehab  - Active co-morbidities: Coronary Artery Disease OR   - Unanticipated findings during/Post Surgery: None  - Patient is a high risk of re-admission due to: None    DVT Prophylaxis - Xarelto Weight bearing as tolerated. D/C O2 and pulse ox and try on room air. Hemovac pulled without difficulty, will continue therapy today.  Plan is to go Home after hospital stay. Possible discharge this afternoon if progresses with therapy and is meeting his goals. Scheduled for outpatient physical therapy at [facility]. Follow-up in the office in 2 weeks with Dr. Wynelle Link.  Theresa Duty, PA-C Orthopedic Surgery 03/21/2018, 7:04 AM

## 2018-03-22 DIAGNOSIS — I9589 Other hypotension: Secondary | ICD-10-CM

## 2018-03-22 DIAGNOSIS — I480 Paroxysmal atrial fibrillation: Secondary | ICD-10-CM

## 2018-03-22 DIAGNOSIS — E861 Hypovolemia: Secondary | ICD-10-CM

## 2018-03-22 LAB — CBC
HEMATOCRIT: 35.9 % — AB (ref 39.0–52.0)
Hemoglobin: 11.5 g/dL — ABNORMAL LOW (ref 13.0–17.0)
MCH: 30.5 pg (ref 26.0–34.0)
MCHC: 32 g/dL (ref 30.0–36.0)
MCV: 95.2 fL (ref 80.0–100.0)
NRBC: 0 % (ref 0.0–0.2)
Platelets: 197 10*3/uL (ref 150–400)
RBC: 3.77 MIL/uL — ABNORMAL LOW (ref 4.22–5.81)
RDW: 13.6 % (ref 11.5–15.5)
WBC: 10.6 10*3/uL — AB (ref 4.0–10.5)

## 2018-03-22 LAB — TROPONIN I

## 2018-03-22 LAB — BASIC METABOLIC PANEL
ANION GAP: 9 (ref 5–15)
BUN: 23 mg/dL (ref 8–23)
CALCIUM: 8.3 mg/dL — AB (ref 8.9–10.3)
CO2: 22 mmol/L (ref 22–32)
CREATININE: 0.93 mg/dL (ref 0.61–1.24)
Chloride: 108 mmol/L (ref 98–111)
GFR calc non Af Amer: >60 mL/min (ref >60)
Glucose, Bld: 116 mg/dL — ABNORMAL HIGH (ref 70–99)
Potassium: 3.9 mmol/L (ref 3.5–5.1)
SODIUM: 139 mmol/L (ref 135–145)

## 2018-03-22 MED ORDER — METOPROLOL SUCCINATE ER 25 MG PO TB24: 25.0000 mg | ORAL_TABLET | Freq: Every day | ORAL | 0 refills | Status: DC

## 2018-03-22 MED ORDER — AMLODIPINE BESYLATE 10 MG PO TABS: 10.0000 mg | ORAL_TABLET | Freq: Every day | ORAL | Status: DC

## 2018-03-22 MED ORDER — ATORVASTATIN CALCIUM 20 MG PO TABS: 20.0000 mg | ORAL_TABLET | Freq: Every day | ORAL | 0 refills | Status: DC

## 2018-03-22 MED ORDER — METOPROLOL SUCCINATE ER 50 MG PO TB24: 50.0000 mg | ORAL_TABLET | Freq: Every day | ORAL | Status: DC

## 2018-03-22 MED ORDER — METOPROLOL SUCCINATE ER 25 MG PO TB24
25.0000 mg | ORAL_TABLET | Freq: Every day | ORAL | Status: DC
  Administered 2018-03-22: 25 mg via ORAL
  Filled 2018-03-22: qty 1

## 2018-03-22 MED ORDER — ATORVASTATIN CALCIUM 20 MG PO TABS: 20.0000 mg | ORAL_TABLET | Freq: Every day | ORAL | Status: DC

## 2018-03-22 NOTE — Progress Notes (Signed)
Physical Therapy Treatment Patient Details Name: [name] [name] MRN: [id number] DOB: 1950/11/12 Today's Date: 03/22/2018    History of Present Illness Pt is a 67 YO male s/p L TKR on 03/20/18. PMH includes DOE, PAF, MI, obesity, HTN, sleep apnea. Surgical history includes AAA repair, aortic valve replacement 2008.     PT Comments    Pt ambulated to steps and practiced twice.  Pt and spouse required repetitive cues and demonstrations.  Provided handout on safe technique as well.  Pt had no dizziness this afternoon.  Pt to d/c home today.   Follow Up Recommendations  Follow surgeon's recommendation for DC plan and follow-up therapies     Equipment Recommendations  None recommended by PT    Recommendations for Other Services       Precautions / Restrictions Precautions Precautions: Fall Precaution Comments: reviewed appropriate use of KI Required Braces or Orthoses: Knee Immobilizer - Left Knee Immobilizer - Left: On when out of bed or walking;Discontinue once straight leg raise with < 10 degree lag    Mobility  Bed Mobility Overal bed mobility: Needs Assistance Bed Mobility: Supine to Sit     Supine to sit: Min guard     General bed mobility comments: verbal cues for self assist  Transfers Overall transfer level: Needs assistance Equipment used: Rolling walker (2 wheeled) Transfers: Sit to/from Stand Sit to Stand: Min guard         General transfer comment: verbal cues for UE and LE positioning  Ambulation/Gait Ambulation/Gait assistance: Min guard Gait Distance (Feet): 30 Feet Assistive device: Rolling walker (2 wheeled) Gait Pattern/deviations: Step-to pattern;Antalgic;Decreased stance time - left     General Gait Details: verbal cues for sequence, RW positioning, step length; no dizziness today    Stairs Stairs: Yes Stairs assistance: Min assist;Min guard Stair Management: Step to pattern;Backwards;With walker Number of Stairs: 2 General stair  comments: verbal cues for sequence, safety and RW positioning; spouse assisted with holding RW upon second performance, pt and spouse report understanding; spouse provided with handout   Wheelchair Mobility    Modified Rankin (Stroke Patients Only)       Balance                                            Cognition Arousal/Alertness: Awake/alert Behavior During Therapy: WFL for tasks assessed/performed Overall Cognitive Status: Within Functional Limits for tasks assessed                                        Exercises     General Comments        Pertinent Vitals/Pain Pain Assessment: 0-10 Pain Score: 5  Pain Location:  L thigh and knee Pain Descriptors / Indicators: Aching;Sore Pain Intervention(s): Limited activity within patient's tolerance;Repositioned;Monitored during session    Home Living                      Prior Function            PT Goals (current goals can now be found in the care plan section) Progress towards PT goals: Progressing toward goals    Frequency    7X/week      PT Plan Current plan remains appropriate    Co-evaluation  AM-PAC PT "6 Clicks" Daily Activity  Outcome Measure  Difficulty turning over in bed (including adjusting bedclothes, sheets and blankets)?: A Little Difficulty moving from lying on back to sitting on the side of the bed? : A Little Difficulty sitting down on and standing up from a chair with arms (e.g., wheelchair, bedside commode, etc,.)?: A Little Help needed moving to and from a bed to chair (including a wheelchair)?: A Little Help needed walking in hospital room?: A Little Help needed climbing 3-5 steps with a railing? : A Little 6 Click Score: 18    End of Session Equipment Utilized During Treatment: Gait belt;Left knee immobilizer Activity Tolerance: Patient tolerated treatment well Patient left: in chair;with call bell/phone within  reach;with family/visitor present;with nursing/sitter in room   PT Visit Diagnosis: Difficulty in walking, not elsewhere classified (R26.2);Other abnormalities of gait and mobility (R26.89)     Time: 4332-9518 PT Time Calculation (min) (ACUTE ONLY): 25 min  Charges:  $Gait Training: 23-37 mins                     Carmelia Bake, PT, DPT Acute Rehabilitation Services Office: [phone] Pager: [phone]  Trena Platt 03/22/2018, 4:32 PM

## 2018-03-22 NOTE — Consult Note (Addendum)
Cardiology Consultation:   Patient ID: [name] [name] MRN: [id number]; DOB: 1950-12-24  Admit date: 03/20/2018 Date of Consult: 03/22/2018  Primary Care Provider: Mancel Bale, [name] Primary Cardiologist: [name] Grooms, MD  Primary Electrophysiologist:  None    Patient Profile:   [name] [name] is a 67 y.o. male with a hx of bicuspid aortic vavle with aortic aneurysm s/p Bentall procedure in 2008, maze for PAF, obesity, OSA, DM, and HTN who is being seen today for the evaluation of hypotension at the request of [name] [name].  History of Present Illness:   [name] [name] was recently cleared by cardiology for knee surgery. He presented to [facility] for knee surgery and tolerated the procedure well. However, when working with PT yesterday he was noted to be hypotensive and became dizzy. He was given 500 cc NS bolus.   Since yesterday, pressures have systolic in the 865-784O without torpol or norvasc. He feels well today, no further dizziness or feelings of near-syncope. Cardiology was asked to consult to adjust cardiac medication regimen for discharge home.   Past Medical History:  Diagnosis Date  . Bicuspid aortic valve    Bentall procedure 2008  . Heart attack ([place])   . Hypertension     Past Surgical History:  Procedure Laterality Date  . CARDIAC CATHETERIZATION    . CARDIAC VALVE REPLACEMENT     Bentall procedure, 2008  . CORONARY ARTERY BYPASS GRAFT  2007  . TONSILLECTOMY     age 48  . TOTAL KNEE ARTHROPLASTY Left 03/20/2018   Procedure: LEFT TOTAL KNEE ARTHROPLASTY;  Surgeon: [name] Arabian, MD;  Location: [facility]S;  Service: Orthopedics;  Laterality: Left;  Marland Kitchen VASECTOMY       Home Medications:  Prior to Admission medications   Medication Sig Start Date End Date Taking? Authorizing Provider  acetaminophen (TYLENOL) 500 MG tablet Take 1,000 mg by mouth 3 (three) times daily.   Yes [provider]  ALPRAZolam (XANAX) 0.25 MG tablet Take 1 tablet (0.25 mg total) by mouth  daily. Patient taking differently: Take 0.25 mg by mouth daily as needed. Last dose was 1 week ago - use as needed for public speaking 9/62/95  Yes Weber, [name] [name]  amlodipine-atorvastatin (CADUET) 10-20 MG tablet Take 1 tablet by mouth daily. 09/06/17  Yes [name] Crome, MD  aspirin 81 MG EC tablet Take 81 mg by mouth daily.    Yes [provider]  escitalopram (LEXAPRO) 20 MG tablet Take 1 tablet (20 mg total) by mouth daily. 02/09/18  Yes Weber, [name] [name]  metoprolol succinate (TOPROL-XL) 50 MG 24 hr tablet Take 1 tablet (50 mg total) by mouth daily. 09/16/17  Yes [name] Crome, MD  gabapentin (NEURONTIN) 300 MG capsule Take 1 capsule (300 mg total) by mouth 3 (three) times daily. Gabapentin 300 mg Protocol Take a 300 mg capsule three times a day for two weeks following surgery. Then take a 300 mg capsule two times a day for two weeks.  Then take a 300 mg capsule once a day for two weeks.  Then discontinue the Gabapentin. 03/21/18   [name] [name]  methocarbamol (ROBAXIN) 500 MG tablet Take 1 tablet (500 mg total) by mouth every 6 (six) hours as needed for muscle spasms. 03/21/18   Edmisten, Kristie [name], [name]  oxyCODONE (OXY IR/ROXICODONE) 5 MG immediate release tablet Take 1-2 tablets (5-10 mg total) by mouth every 6 (six) hours as needed for moderate pain (pain score 4-6).  03/21/18   [name] [name]  rivaroxaban (XARELTO) 10 MG TABS tablet Take 1 tablet (10 mg total) by mouth daily with breakfast. Then resume one 81 mg aspirin once a day. 03/21/18   [name] [name]  sildenafil (VIAGRA) 100 MG tablet Take 0.5-1 tablets (50-100 mg total) by mouth daily as needed for erectile dysfunction. 10/25/17   [name] [name]  sodium chloride (OCEAN) 0.65 % SOLN nasal spray Place 1 spray into both nostrils daily as needed for congestion.    [provider]    Inpatient Medications: Scheduled Meds: . amLODipine  10 mg Oral Daily   And  . atorvastatin  20  mg Oral q1800  . docusate sodium  100 mg Oral BID  . escitalopram  20 mg Oral Daily  . gabapentin  300 mg Oral TID  . metoprolol succinate  50 mg Oral Daily  . rivaroxaban  10 mg Oral Q breakfast   Continuous Infusions: . sodium chloride 100 mL/hr at 03/22/18 0137  . methocarbamol (ROBAXIN) IV Stopped (03/20/18 1218)   PRN Meds: bisacodyl, diphenhydrAMINE, menthol-cetylpyridinium **OR** phenol, methocarbamol **OR** methocarbamol (ROBAXIN) IV, metoCLOPramide **OR** metoCLOPramide (REGLAN) injection, morphine injection, ondansetron **OR** ondansetron (ZOFRAN) IV, oxyCODONE, polyethylene glycol, sodium phosphate  Allergies:    Allergies  Allergen Reactions  . Amiodarone Rash  . Amlodipine Rash    Social History:   Social History   Socioeconomic History  . Marital status: Married    Spouse name: [name] [name]  . Number of children: 2  . Years of education: College  . Highest education level: Not on file  Occupational History  . Not on file  Social Needs  . Financial resource strain: Not on file  . Food insecurity:    Worry: Not on file    Inability: Not on file  . Transportation needs:    Medical: Not on file    Non-medical: Not on file  Tobacco Use  . Smoking status: Former Smoker    Packs/day: 0.30    Years: 25.00    Pack years: 7.50    Last attempt to quit: 10/04/2003    Years since quitting: 14.4  . Smokeless tobacco: Never Used  Substance and Sexual Activity  . Alcohol use: Not Currently    Alcohol/week: 0.0 standard drinks  . Drug use: No  . Sexual activity: Yes    Partners: Female  Lifestyle  . Physical activity:    Days per week: Not on file    Minutes per session: Not on file  . Stress: Not on file  Relationships  . Social connections:    Talks on phone: Not on file    Gets together: Not on file    Attends religious service: Not on file    Active member of club or organization: Not on file    Attends meetings of clubs or organizations: Not on file     Relationship status: Not on file  . Intimate partner violence:    Fear of current or ex partner: Not on file    Emotionally abused: Not on file    Physically abused: Not on file    Forced sexual activity: Not on file  Other Topics Concern  . Not on file  Social History Narrative   Patient is married Engineer, drilling) and lives at home with his wife.   Patient has two children.   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   Patient has  a college education.    Family History:    Family History  Problem Relation Age of Onset  . COPD Mother   . Cancer Father   . Atrial fibrillation Father   . Atrial fibrillation Brother   . Cancer Maternal Grandmother        died at age 2  . Kidney disease Maternal Grandmother   . Cancer Maternal Grandfather   . Kidney disease Maternal Grandfather   . Stroke Paternal Grandfather      ROS:  Please see the history of present illness.   All other ROS reviewed and negative.     Physical Exam/Data:   Vitals:   03/21/18 1959 03/21/18 2108 03/22/18 0114 03/22/18 0500  BP: 129/66 125/69 136/69 139/72  Pulse: (!) 59 (!) 58 63 (!) 58  Resp: 16 18 17 16   Temp: 98.2 F (36.8 C) 97.7 F (36.5 C)  98.4 F (36.9 C)  TempSrc: Oral Oral  Oral  SpO2: 97% 97% 98% 99%  Weight:      Height:        Intake/Output Summary (Last 24 hours) at 03/22/2018 0841 Last data filed at 03/22/2018 0600 Gross per 24 hour  Intake 4077.41 ml  Output 300 ml  Net 3777.41 ml   Filed Weights   03/20/18 0702  Weight: 111.4 kg   Body mass index is 35.23 kg/m.  General:  Well nourished, well developed, in no acute distress HEENT: normal Neck: no JVD Vascular: No carotid bruits Cardiac:  normal S1, S2; RRR; no murmur  Lungs:  clear to auscultation bilaterally, no wheezing, rhonchi or rales  Abd: soft, nontender, no hepatomegaly  Ext: no edema Musculoskeletal:  No deformities, BUE and BLE strength normal and equal Skin: warm and dry    Neuro:  CNs 2-12 intact, no focal abnormalities noted Psych:  Normal affect   EKG:  The EKG was personally reviewed and demonstrates:  Sinus bradycardia Telemetry:  Telemetry was personally reviewed and demonstrates:  N/A  Relevant CV Studies:  Echo 07/21/15: Study Conclusions - Left ventricle: The cavity size was normal. There was moderate   concentric hypertrophy. Systolic function was normal. The   estimated ejection fraction was in the range of 60% to 65%. Wall   motion was normal; there were no regional wall motion   abnormalities. Features are consistent with a pseudonormal left   ventricular filling pattern, with concomitant abnormal relaxation   and increased filling pressure (grade 2 diastolic dysfunction). - Ventricular septum: Septal motion showed paradox. - Aortic valve: A bioprosthesis was present and functioning   normally. - Aorta: S/P graft repair of the ascending aorta. - Left atrium: The atrium was mildly dilated.  Impressions: - Prosthetic valve gradients are similar to 2011.   Holter monitor 07/21/15: There is evidence of sinus node dysfunction with both bradycardia and tachycardia. No atrial fibrillation is noted. This requires clinical observation but no active treatment at this time.   Laboratory Data:  Chemistry Recent Labs  Lab 03/21/18 0428 03/22/18 0455  NA 138 139  K 4.6 3.9  CL 103 108  CO2 27 22  GLUCOSE 127* 116*  BUN 24* 23  CREATININE 1.07 0.93  CALCIUM 9.0 8.3*  GFRNONAA >60 >60  GFRAA >60 >60  ANIONGAP 8 9    No results for input(s): PROT, ALBUMIN, AST, ALT, ALKPHOS, BILITOT in the last 168 hours. Hematology Recent Labs  Lab 03/21/18 0428 03/22/18 0455  WBC 11.8* 10.6*  RBC 4.45 3.77*  HGB 13.5 11.5*  HCT 42.4 35.9*  MCV 95.3 95.2  MCH 30.3 30.5  MCHC 31.8 32.0  RDW 13.3 13.6  PLT 224 197   Cardiac Enzymes Recent Labs  Lab 03/21/18 1639 03/21/18 2255 03/22/18 0455  TROPONINI <0.03 <0.03 <0.03   No results  for input(s): TROPIPOC in the last 168 hours.  BNPNo results for input(s): BNP, PROBNP in the last 168 hours.  DDimer No results for input(s): DDIMER in the last 168 hours.  Radiology/Studies:  No results found.  Assessment and Plan:   1. Brief episode of hypotension yesterday while working with PT, now resolved - given 500 NS bolus - pressures 726-203T systolic prior to morning norvasc and toprol - for discharge: hold norvasc for now, decrease torpol to 25 mg daily - ambulate again and recheck pressure; if stable and asymptomatic, can discharge home - we will see him in the office next week and restart norvasc if needed at that time  2. Recent weight loss - has lost 70 lbs - may not need to restart norvasc - monitor pressures at home  3. Hx of PAF s/p MAZE, no Afib on holter 2017 - EKG with sinus bradycardia - will decrease torpol to 25 mg daily  [facility] will sign off.   Medication Recommendations:  D/C  Norvasc, decrease toprol to 25 mg Other recommendations (labs, testing, etc):   Follow up as an outpatient:  appt made  For questions or updates, please contact [place] Please consult [email or web address] for contact info under   Signed, Ledora Bottcher, [name]  03/22/2018 8:41 AM   The patient was seen, examined and discussed with Minette Brine , [name] and I agree with the above.   67 y.o. male with a hx of bicuspid aortic vavle with aortic aneurysm s/p Bentall procedure in 2008, maze for PAF, obesity, OSA, DM, and HTN who underwent knee replacement surgery on March 20, 2018.  Cardiology was consulted for recurrent symptomatic hypotension during physical therapy in the post..  The patient has been doing great from cardiac standpoint recently, in the last year he was diagnosed with diabetes and his significantly changed his diet and exercise plan resulting in weight loss of 80 pounds.  Yesterday while walking with physical therapy he became dizzy and diaphoretic his  blood pressure was 70/50, this happened on 2 occasions. He is currently asymptomatic, his vitals are blood pressure ranging from 120s to 130s, telemetry shows sinus bradycardia with ventricular rate in 50s. This is most probably a combination of recent dramatic weight loss, spinal block, and postop period with decreased oral intake.  We would recommend to hold amlodipine, decrease Toprol-XL from 50 to 25 mg daily, we will arrange for this patient to be seen in our clinic next week and adjust his medications as needed.  He can be discharged today.  Ena Dawley, MD 03/22/2018

## 2018-03-22 NOTE — Progress Notes (Signed)
Physical Therapy Treatment Patient Details Name: [name] [name] MRN: [id number] DOB: 1951/01/12 Today's Date: 03/22/2018    History of Present Illness Pt is a 67 YO male s/p L TKR on 03/20/18. PMH includes DOE, PAF, MI, obesity, HTN, sleep apnea. Surgical history includes AAA repair, aortic valve replacement 2008.     PT Comments    Pt ambulated in hallway and had no dizziness today.  Pt performed LE exercises and provided with HEP handout.  Will return to practice steps with pt prior to d/c.   Follow Up Recommendations  Follow surgeon's recommendation for DC plan and follow-up therapies     Equipment Recommendations  None recommended by PT    Recommendations for Other Services       Precautions / Restrictions Precautions Precautions: Fall Required Braces or Orthoses: Knee Immobilizer - Left    Mobility  Bed Mobility Overal bed mobility: Needs Assistance Bed Mobility: Supine to Sit     Supine to sit: Min guard     General bed mobility comments: verbal cues for self assist  Transfers Overall transfer level: Needs assistance Equipment used: Rolling walker (2 wheeled) Transfers: Sit to/from Stand Sit to Stand: Min guard         General transfer comment: verbal cues for UE and LE positioning  Ambulation/Gait Ambulation/Gait assistance: Min guard Gait Distance (Feet): 100 Feet Assistive device: Rolling walker (2 wheeled) Gait Pattern/deviations: Step-to pattern;Antalgic;Decreased stance time - left     General Gait Details: verbal cues for sequence, RW positioning, step length; no dizziness today and BP checked (see docflowsheets)   Stairs             Wheelchair Mobility    Modified Rankin (Stroke Patients Only)       Balance                                            Cognition Arousal/Alertness: Awake/alert Behavior During Therapy: WFL for tasks assessed/performed Overall Cognitive Status: Within Functional Limits for  tasks assessed                                        Exercises Total Joint Exercises Ankle Circles/Pumps: AROM;10 reps;Both Quad Sets: AROM;10 reps;Both Short Arc Quad: AROM;10 reps;Left Heel Slides: AAROM;10 reps;Left Hip ABduction/ADduction: Left;AROM;5 reps Straight Leg Raises: AROM;Left;5 reps    General Comments        Pertinent Vitals/Pain Pain Assessment: 0-10 Pain Score: 6  Pain Location:  L thigh  Pain Descriptors / Indicators: Aching;Sore Pain Intervention(s): Monitored during session;Limited activity within patient's tolerance;Repositioned;Ice applied;RN gave pain meds during session    Home Living                      Prior Function            PT Goals (current goals can now be found in the care plan section) Progress towards PT goals: Progressing toward goals    Frequency    7X/week      PT Plan Current plan remains appropriate    Co-evaluation              AM-PAC PT "6 Clicks" Daily Activity  Outcome Measure  Difficulty turning over in bed (including adjusting bedclothes, sheets and blankets)?: A Little  Difficulty moving from lying on back to sitting on the side of the bed? : A Little Difficulty sitting down on and standing up from a chair with arms (e.g., wheelchair, bedside commode, etc,.)?: A Little Help needed moving to and from a bed to chair (including a wheelchair)?: A Little Help needed walking in hospital room?: A Little Help needed climbing 3-5 steps with a railing? : A Little 6 Click Score: 18    End of Session Equipment Utilized During Treatment: Gait belt;Left knee immobilizer Activity Tolerance: Patient tolerated treatment well Patient left: in chair;with call bell/phone within reach;with family/visitor present   PT Visit Diagnosis: Difficulty in walking, not elsewhere classified (R26.2);Other abnormalities of gait and mobility (R26.89)     Time: 0354-6568 PT Time Calculation (min) (ACUTE  ONLY): 38 min  Charges:  $Gait Training: 8-22 mins $Therapeutic Exercise: 8-22 mins                     Carmelia Bake, PT, DPT Acute Rehabilitation Services Office: [phone] Pager: [phone]  Trena Platt 03/22/2018, 1:01 PM

## 2018-03-22 NOTE — Plan of Care (Signed)
Patient discharged home. All Rx given. IV removed. Instructions given to patient and wife, both verbalized understanding

## 2018-03-22 NOTE — Progress Notes (Signed)
   Subjective: 2 Days Post-Op Procedure(s) (LRB): LEFT TOTAL KNEE ARTHROPLASTY (Left) Patient reports pain as moderate.   Patient seen in rounds with Dr. Wynelle Link. Patient is well, and has had no acute complaints or problems other than pain in the left knee. Got up to go to the restroom last night with no issues, no repeat episodes of hypotension. Called for cardiology consult yesterday after second PT session, they will be rounding with patient this AM. Voiding without difficulty and positive flatus.  Plan is to go Home after hospital stay.  Objective: Vital signs in last 24 hours: Temp:  [97.7 F (36.5 C)-98.4 F (36.9 C)] 98.4 F (36.9 C) (10/23 0500) Pulse Rate:  [47-68] 58 (10/23 0500) Resp:  [16-18] 16 (10/23 0500) BP: (75-139)/(46-75) 139/72 (10/23 0500) SpO2:  [96 %-100 %] 99 % (10/23 0500)  Intake/Output from previous day:  Intake/Output Summary (Last 24 hours) at 03/22/2018 0744 Last data filed at 03/22/2018 0600 Gross per 24 hour  Intake 4077.41 ml  Output 850 ml  Net 3227.41 ml    Intake/Output this shift: No intake/output data recorded.  Labs: Recent Labs    03/21/18 0428 03/22/18 0455  HGB 13.5 11.5*   Recent Labs    03/21/18 0428 03/22/18 0455  WBC 11.8* 10.6*  RBC 4.45 3.77*  HCT 42.4 35.9*  PLT 224 197   Recent Labs    03/21/18 0428 03/22/18 0455  NA 138 139  K 4.6 3.9  CL 103 108  CO2 27 22  BUN 24* 23  CREATININE 1.07 0.93  GLUCOSE 127* 116*  CALCIUM 9.0 8.3*   No results for input(s): LABPT, INR in the last 72 hours.  Exam: General - Patient is Alert and Oriented Extremity - Neurologically intact Neurovascular intact Sensation intact distally Dorsiflexion/Plantar flexion intact Dressing/Incision - clean, dry, no drainage Motor Function - intact, moving foot and toes well on exam.   Past Medical History:  Diagnosis Date  . Bicuspid aortic valve    Bentall procedure 2008  . Heart attack ([place])   . Hypertension      Assessment/Plan: 2 Days Post-Op Procedure(s) (LRB): LEFT TOTAL KNEE ARTHROPLASTY (Left) Principal Problem:   Osteoarthritis of knee Active Problems:   OA (osteoarthritis) of knee  Estimated body mass index is 35.23 kg/m as calculated from the following:   Height as of this encounter: 5\' 10"  (1.778 m).   Weight as of this encounter: 111.4 kg. Up with therapy D/C IV fluids  DVT Prophylaxis - Xarelto Weight-bearing as tolerated  From orthopedic standpoint, patient is ready for d/c to home. If cleared by cardiology this AM, can go home with outpatient PT at [place], PA-C Orthopedic Surgery 03/22/2018, 7:44 AM

## 2018-03-23 NOTE — Telephone Encounter (Signed)
done

## 2018-03-24 DIAGNOSIS — M25562 Pain in left knee: Secondary | ICD-10-CM | POA: Diagnosis not present

## 2018-03-27 DIAGNOSIS — M25562 Pain in left knee: Secondary | ICD-10-CM | POA: Diagnosis not present

## 2018-03-27 NOTE — Discharge Summary (Signed)
Physician Discharge Summary   Patient ID: JESTER KLINGBERG MRN: [id number] DOB/AGE: 02-13-1951 67 y.o.  Admit date: 03/20/2018 Discharge date: 03/22/2018  Primary Diagnosis: Osteoarthritis, left knee   Admission Diagnoses:  Past Medical History:  Diagnosis Date  . Bicuspid aortic valve    Bentall procedure 2008  . Heart attack ([facility])   . Hypertension    Discharge Diagnoses:   Principal Problem:   Osteoarthritis of knee Active Problems:   OA (osteoarthritis) of knee  Estimated body mass index is 35.23 kg/m as calculated from the following:   Height as of this encounter: '5\' 10"'$  (1.778 m).   Weight as of this encounter: 111.4 kg.  Procedure:  Procedure(s) (LRB): LEFT TOTAL KNEE ARTHROPLASTY (Left)   Consults: cardiology  HPI: HOMER MILLER is a 67 y.o. year old male year old male with end stage OA of his left knee with progressively worsening pain and dysfunction. He has constant pain, with activity and at rest and significant functional deficits with difficulties even with ADLs. He has had extensive non-op management including analgesics, injections of cortisone, and home exercise program, but remains in significant pain with significant dysfunction. Radiographs show bone on bone arthritis medial and patellofemoral. He presents now for left Total Knee Arthroplasty.   Laboratory Data: Admission on 03/20/2018, Discharged on 03/22/2018  Component Date Value Ref Range Status  . WBC 03/21/2018 11.8* 4.0 - 10.5 K/uL Final  . RBC 03/21/2018 4.45  4.22 - 5.81 MIL/uL Final  . Hemoglobin 03/21/2018 13.5  13.0 - 17.0 g/dL Final  . HCT 03/21/2018 42.4  39.0 - 52.0 % Final  . MCV 03/21/2018 95.3  80.0 - 100.0 fL Final  . MCH 03/21/2018 30.3  26.0 - 34.0 pg Final  . MCHC 03/21/2018 31.8  30.0 - 36.0 g/dL Final  . RDW 03/21/2018 13.3  11.5 - 15.5 % Final  . Platelets 03/21/2018 224  150 - 400 K/uL Final  . nRBC 03/21/2018 0.0  0.0 - 0.2 % Final   Performed at [facility], [place]  [address] [place], [place] [address]  . Sodium 03/21/2018 138  135 - 145 mmol/L Final  . Potassium 03/21/2018 4.6  3.5 - 5.1 mmol/L Final  . Chloride 03/21/2018 103  98 - 111 mmol/L Final  . CO2 03/21/2018 27  22 - 32 mmol/L Final  . Glucose, Bld 03/21/2018 127* 70 - 99 mg/dL Final  . BUN 03/21/2018 24* 8 - 23 mg/dL Final  . Creatinine, Ser 03/21/2018 1.07  0.61 - 1.24 mg/dL Final  . Calcium 03/21/2018 9.0  8.9 - 10.3 mg/dL Final  . GFR calc non Af Amer 03/21/2018 >60  >60 mL/min Final  . GFR calc Af Amer 03/21/2018 >60  >60 mL/min Final   Comment: (NOTE) The eGFR has been calculated using the CKD EPI equation. This calculation has not been validated in all clinical situations. eGFR's persistently <60 mL/min signify possible Chronic Kidney Disease.   Georgiann Hahn gap 03/21/2018 8  5 - 15 Final   Performed at [facility], [place] [address] [place], [place] [address]  . Troponin I 03/21/2018 <0.03  <0.03 ng/mL Final   Performed at [facility], [place] [address] [place], [place] [address]  . Troponin I 03/21/2018 <0.03  <0.03 ng/mL Final   Performed at [facility], [place] [address], [place], [place] [address]  . Troponin I 03/22/2018 <0.03  <0.03 ng/mL Final   Performed at [facility], [place] [address]  [place], [place] [address]  . WBC 03/22/2018 10.6* 4.0 - 10.5 K/uL Final  . RBC 03/22/2018 3.77* 4.22 - 5.81 MIL/uL Final  . Hemoglobin 03/22/2018 11.5* 13.0 - 17.0 g/dL Final  . HCT 03/22/2018 35.9* 39.0 - 52.0 % Final  . MCV 03/22/2018 95.2  80.0 - 100.0 fL Final  . MCH 03/22/2018 30.5  26.0 - 34.0 pg Final  . MCHC 03/22/2018 32.0  30.0 - 36.0 g/dL Final  . RDW 03/22/2018 13.6  11.5 - 15.5 % Final  . Platelets 03/22/2018 197  150 - 400 K/uL Final  . nRBC 03/22/2018 0.0  0.0 - 0.2 % Final   Performed at [facility], [place] [address] [place], [place] [address]  . Sodium 03/22/2018 139  135 - 145  mmol/L Final  . Potassium 03/22/2018 3.9  3.5 - 5.1 mmol/L Final  . Chloride 03/22/2018 108  98 - 111 mmol/L Final  . CO2 03/22/2018 22  22 - 32 mmol/L Final  . Glucose, Bld 03/22/2018 116* 70 - 99 mg/dL Final  . BUN 03/22/2018 23  8 - 23 mg/dL Final  . Creatinine, Ser 03/22/2018 0.93  0.61 - 1.24 mg/dL Final  . Calcium 03/22/2018 8.3* 8.9 - 10.3 mg/dL Final  . GFR calc non Af Amer 03/22/2018 >60  >60 mL/min Final  . GFR calc Af Amer 03/22/2018 >60  >60 mL/min Final   Comment: (NOTE) The eGFR has been calculated using the CKD EPI equation. This calculation has not been validated in all clinical situations. eGFR's persistently <60 mL/min signify possible Chronic Kidney Disease.   Georgiann Hahn gap 03/22/2018 9  5 - 15 Final   Performed at [facility], [place] [address], [place], [place] [address]  Hospital Outpatient Visit on 03/13/2018  Component Date Value Ref Range Status  . MRSA, PCR 03/13/2018 NEGATIVE  NEGATIVE Final  . Staphylococcus aureus 03/13/2018 NEGATIVE  NEGATIVE Final   Comment: (NOTE) The Xpert SA Assay (FDA approved for NASAL specimens in patients 33 years of age and older), is one component of a comprehensive surveillance program. It is not intended to diagnose infection nor to guide or monitor treatment. Performed at [facility], [place] [address], [place], [place] [address]   . aPTT 03/13/2018 36  24 - 36 seconds Final   Performed at [facility], [place] [address] [place], [place] [address]  . WBC 03/13/2018 6.8  4.0 - 10.5 K/uL Final  . RBC 03/13/2018 4.91  4.22 - 5.81 MIL/uL Final  . Hemoglobin 03/13/2018 14.8  13.0 - 17.0 g/dL Final  . HCT 03/13/2018 46.6  39.0 - 52.0 % Final  . MCV 03/13/2018 94.9  80.0 - 100.0 fL Final  . MCH 03/13/2018 30.1  26.0 - 34.0 pg Final  . MCHC 03/13/2018 31.8  30.0 - 36.0 g/dL Final  . RDW 03/13/2018 13.5  11.5 - 15.5 % Final  . Platelets 03/13/2018 225  150 - 400 K/uL Final  .  nRBC 03/13/2018 0.0  0.0 - 0.2 % Final   Performed at [facility], [place] [address] [place], [place] [address]  . Sodium 03/13/2018 141  135 - 145 mmol/L Final  . Potassium 03/13/2018 4.8  3.5 - 5.1 mmol/L Final  . Chloride 03/13/2018 105  98 - 111 mmol/L Final  . CO2 03/13/2018 26  22 - 32 mmol/L Final  . Glucose, Bld 03/13/2018 106* 70 - 99 mg/dL Final  . BUN 03/13/2018 33* 8 - 23  mg/dL Final  . Creatinine, Ser 03/13/2018 1.13  0.61 - 1.24 mg/dL Final  . Calcium 03/13/2018 9.4  8.9 - 10.3 mg/dL Final  . Total Protein 03/13/2018 7.2  6.5 - 8.1 g/dL Final  . Albumin 03/13/2018 4.2  3.5 - 5.0 g/dL Final  . AST 03/13/2018 26  15 - 41 U/L Final  . ALT 03/13/2018 32  0 - 44 U/L Final  . Alkaline Phosphatase 03/13/2018 80  38 - 126 U/L Final  . Total Bilirubin 03/13/2018 0.7  0.3 - 1.2 mg/dL Final  . GFR calc non Af Amer 03/13/2018 >60  >60 mL/min Final  . GFR calc Af Amer 03/13/2018 >60  >60 mL/min Final   Comment: (NOTE) The eGFR has been calculated using the CKD EPI equation. This calculation has not been validated in all clinical situations. eGFR's persistently <60 mL/min signify possible Chronic Kidney Disease.   Georgiann Hahn gap 03/13/2018 10  5 - 15 Final   Performed at [facility], [place] [address], [place], [place] [address]  . Prothrombin Time 03/13/2018 11.9  11.4 - 15.2 seconds Final  . INR 03/13/2018 0.89   Final   Performed at [facility], [place] [address], [place], [place] [address]  . ABO/RH(D) 03/13/2018 O POS   Final  . Antibody Screen 03/13/2018 NEG   Final  . Sample Expiration 03/13/2018 03/23/2018   Final  . Extend sample reason 03/13/2018    Final                   Value:NO TRANSFUSIONS OR PREGNANCY IN THE PAST 3 MONTHS Performed at [facility], [place] [address], [place], [place] [address]   . ABO/RH(D) 03/13/2018    Final                   Value:O POS Performed at [facility], [place] [address] [place],  [address]      X-Rays:No results found.  EKG: Orders placed or performed during the hospital encounter of 03/20/18  . EKG 12-Lead  . EKG 12-Lead     Hospital Course: ROBBIN ESCHER is a 67 y.o. who was admitted to [facility]. They were brought to the operating room on 03/20/2018 and underwent Procedure(s): LEFT TOTAL KNEE ARTHROPLASTY.  Patient tolerated the procedure well and was later transferred to the recovery room and then to the orthopaedic floor for postoperative care. They were given PO and IV analgesics for pain control following their surgery. They were given 24 hours of postoperative antibiotics of  Anti-infectives (From admission, onward)   Start     Dose/Rate Route Frequency Ordered Stop   03/20/18 1430  ceFAZolin (ANCEF) IVPB 2g/100 mL premix     2 g 200 mL/hr over 30 Minutes Intravenous Every 6 hours 03/20/18 1046 03/20/18 2031   03/20/18 0700  ceFAZolin (ANCEF) IVPB 2g/100 mL premix     2 g 200 mL/hr over 30 Minutes Intravenous On call to O.R. 03/20/18 8546 03/20/18 0850     and started on DVT prophylaxis in the form of Xarelto.   PT and OT were ordered for total joint protocol. Discharge planning consulted to help with postop disposition and equipment needs. Patient had a good night on the evening of surgery. They started to get up OOB with therapy on POD #0. Hemovac drain was pulled without difficulty on day one. He had a hypotensive episode that AM while working with therapy, with BP  dropping to 75/46 mmHg and pulse 47 bpm. 500 mL bolus ordered and Norvasc and Metoprolol were held. During second episode of physical therapy, patient became hypotensive again. Another 500 mL bolus was ordered, and due to patient's cardiac history, cardiology was consulted. Patient improved throughout the rest of day one, and was able to get up to the bathroom later that night with no further issues. Pt was seen during rounds on day two and  was ready to go home from an orthopedic standpoint. Patient was seen by cardiology that morning who made minor changes to medications and cleared him for discharge. Dressing was changed and the incision was clean, dry, and intact. Pt worked with therapy for two additional sessions and was meeting their goals. He was discharged to home later that day in stable condition.  Diet: Cardiac diet Activity: WBAT Follow-up: in 2 weeks with Dr. Wynelle Link Disposition: Home with outpatient PT at [facility] Discharged Condition: stable   Discharge Instructions    Call MD / Call 911   Complete by:  As directed    If you experience chest pain or shortness of breath, CALL 911 and be transported to the hospital emergency room.  If you develope a fever above 101 F, pus (white drainage) or increased drainage or redness at the wound, or calf pain, call your surgeon's office.   Change dressing   Complete by:  As directed    Change dressing on Wednesday, then change the dressing daily with sterile 4 x 4 inch gauze dressing and apply TED hose.   Constipation Prevention   Complete by:  As directed    Drink plenty of fluids.  Prune juice may be helpful.  You may use a stool softener, such as Colace (over the counter) 100 mg twice a day.  Use MiraLax (over the counter) for constipation as needed.   Diet - low sodium heart healthy   Complete by:  As directed    Discharge instructions   Complete by:  As directed    Dr. Gaynelle Arabian Total Joint Specialist [facility] [address] [place], [place] [address] [phone]  TOTAL KNEE REPLACEMENT POSTOPERATIVE DIRECTIONS  Knee Rehabilitation, Guidelines Following Surgery  Results after knee surgery are often greatly improved when you follow the exercise, range of motion and muscle strengthening exercises prescribed by your doctor. Safety measures are also important to protect the knee from further injury. Any time any of these exercises cause you to  have increased pain or swelling in your knee joint, decrease the amount until you are comfortable again and slowly increase them. If you have problems or questions, call your caregiver or physical therapist for advice.   HOME CARE INSTRUCTIONS  Remove items at home which could result in a fall. This includes throw rugs or furniture in walking pathways.  ICE to the affected knee every three hours for 30 minutes at a time and then as needed for pain and swelling.  Continue to use ice on the knee for pain and swelling from surgery. You may notice swelling that will progress down to the foot and ankle.  This is normal after surgery.  Elevate the leg when you are not up walking on it.   Continue to use the breathing machine which will help keep your temperature down.  It is common for your temperature to cycle up and down following surgery, especially at night when you are not up moving around and exerting yourself.  The breathing machine keeps your  lungs expanded and your temperature down. Do not place pillow under knee, focus on keeping the knee straight while resting   DIET You may resume your previous home diet once your are discharged from the hospital.  DRESSING / WOUND CARE / SHOWERING You may shower 3 days after surgery, but keep the wounds dry during showering.  You may use an occlusive plastic wrap (Press'n Seal for example), NO SOAKING/SUBMERGING IN THE BATHTUB.  If the bandage gets wet, change with a clean dry gauze.  If the incision gets wet, pat the wound dry with a clean towel. You may start showering once you are discharged home but do not submerge the incision under water. Just pat the incision dry and apply a dry gauze dressing on daily. Change the surgical dressing daily and reapply a dry dressing each time.  ACTIVITY Walk with your walker as instructed. Use walker as long as suggested by your caregivers. Avoid periods of inactivity such as sitting longer than an hour when not  asleep. This helps prevent blood clots.  You may resume a sexual relationship in one month or when given the OK by your doctor.  You may return to work once you are cleared by your doctor.  Do not drive a car for 6 weeks or until released by you surgeon.  Do not drive while taking narcotics.  WEIGHT BEARING Weight bearing as tolerated with assist device (walker, cane, etc) as directed, use it as long as suggested by your surgeon or therapist, typically at least 4-6 weeks.  POSTOPERATIVE CONSTIPATION PROTOCOL Constipation - defined medically as fewer than three stools per week and severe constipation as less than one stool per week.  One of the most common issues patients have following surgery is constipation.  Even if you have a regular bowel pattern at home, your normal regimen is likely to be disrupted due to multiple reasons following surgery.  Combination of anesthesia, postoperative narcotics, change in appetite and fluid intake all can affect your bowels.  In order to avoid complications following surgery, here are some recommendations in order to help you during your recovery period.  Colace (docusate) - Pick up an over-the-counter form of Colace or another stool softener and take twice a day as long as you are requiring postoperative pain medications.  Take with a full glass of water daily.  If you experience loose stools or diarrhea, hold the colace until you stool forms back up.  If your symptoms do not get better within 1 week or if they get worse, check with your doctor.  Dulcolax (bisacodyl) - Pick up over-the-counter and take as directed by the product packaging as needed to assist with the movement of your bowels.  Take with a full glass of water.  Use this product as needed if not relieved by Colace only.   MiraLax (polyethylene glycol) - Pick up over-the-counter to have on hand.  MiraLax is a solution that will increase the amount of water in your bowels to assist with bowel  movements.  Take as directed and can mix with a glass of water, juice, soda, coffee, or tea.  Take if you go more than two days without a movement. Do not use MiraLax more than once per day. Call your doctor if you are still constipated or irregular after using this medication for 7 days in a row.  If you continue to have problems with postoperative constipation, please contact the office for further assistance and recommendations.  If you experience "  the worst abdominal pain ever" or develop nausea or vomiting, please contact the office immediatly for further recommendations for treatment.  ITCHING  If you experience itching with your medications, try taking only a single pain pill, or even half a pain pill at a time.  You can also use Benadryl over the counter for itching or also to help with sleep.   TED HOSE STOCKINGS Wear the elastic stockings on both legs for three weeks following surgery during the day but you may remove then at night for sleeping.  MEDICATIONS See your medication summary on the "After Visit Summary" that the nursing staff will review with you prior to discharge.  You may have some home medications which will be placed on hold until you complete the course of blood thinner medication.  It is important for you to complete the blood thinner medication as prescribed by your surgeon.  Continue your approved medications as instructed at time of discharge.  PRECAUTIONS If you experience chest pain or shortness of breath - call 911 immediately for transfer to the hospital emergency department.  If you develop a fever greater that 101 F, purulent drainage from wound, increased redness or drainage from wound, foul odor from the wound/dressing, or calf pain - CONTACT YOUR SURGEON.                                                   FOLLOW-UP APPOINTMENTS Make sure you keep all of your appointments after your operation with your surgeon and caregivers. You should call the office at the  above phone number and make an appointment for approximately two weeks after the date of your surgery or on the date instructed by your surgeon outlined in the "After Visit Summary".   RANGE OF MOTION AND STRENGTHENING EXERCISES  Rehabilitation of the knee is important following a knee injury or an operation. After just a few days of immobilization, the muscles of the thigh which control the knee become weakened and shrink (atrophy). Knee exercises are designed to build up the tone and strength of the thigh muscles and to improve knee motion. Often times heat used for twenty to thirty minutes before working out will loosen up your tissues and help with improving the range of motion but do not use heat for the first two weeks following surgery. These exercises can be done on a training (exercise) mat, on the floor, on a table or on a bed. Use what ever works the best and is most comfortable for you Knee exercises include:  Leg Lifts - While your knee is still immobilized in a splint or cast, you can do straight leg raises. Lift the leg to 60 degrees, hold for 3 sec, and slowly lower the leg. Repeat 10-20 times 2-3 times daily. Perform this exercise against resistance later as your knee gets better.  Quad and Hamstring Sets - Tighten up the muscle on the front of the thigh (Quad) and hold for 5-10 sec. Repeat this 10-20 times hourly. Hamstring sets are done by pushing the foot backward against an object and holding for 5-10 sec. Repeat as with quad sets.  Leg Slides: Lying on your back, slowly slide your foot toward your buttocks, bending your knee up off the floor (only go as far as is comfortable). Then slowly slide your foot back down until your leg  is flat on the floor again. Angel Wings: Lying on your back spread your legs to the side as far apart as you can without causing discomfort.  A rehabilitation program following serious knee injuries can speed recovery and prevent re-injury in the future due to  weakened muscles. Contact your doctor or a physical therapist for more information on knee rehabilitation.   IF YOU ARE TRANSFERRED TO A SKILLED REHAB FACILITY If the patient is transferred to a skilled rehab facility following release from the hospital, a list of the current medications will be sent to the facility for the patient to continue.  When discharged from the skilled rehab facility, please have the facility set up the patient's [place] prior to being released. Also, the skilled facility will be responsible for providing the patient with their medications at time of release from the facility to include their pain medication, the muscle relaxants, and their blood thinner medication. If the patient is still at the rehab facility at time of the two week follow up appointment, the skilled rehab facility will also need to assist the patient in arranging follow up appointment in our office and any transportation needs.  MAKE SURE YOU:  Understand these instructions.  Get help right away if you are not doing well or get worse.    Pick up stool softner and laxative for home use following surgery while on pain medications. Do not submerge incision under water. Please use good hand washing techniques while changing dressing each day. May shower starting three days after surgery. Please use a clean towel to pat the incision dry following showers. Continue to use ice for pain and swelling after surgery. Do not use any lotions or creams on the incision until instructed by your surgeon.   Do not put a pillow under the knee. Place it under the heel.   Complete by:  As directed    Driving restrictions   Complete by:  As directed    No driving for two weeks   TED hose   Complete by:  As directed    Use stockings (TED hose) for three weeks on both leg(s).  You may remove them at night for sleeping.   Weight bearing as tolerated   Complete by:  As directed      Allergies as  of 03/22/2018      Reactions   Amiodarone Rash   Amlodipine Rash      Medication List    STOP taking these medications   aspirin 81 MG EC tablet     TAKE these medications   acetaminophen 500 MG tablet Commonly known as:  TYLENOL Take 1,000 mg by mouth 3 (three) times daily.   ALPRAZolam 0.25 MG tablet Commonly known as:  XANAX Take 1 tablet (0.25 mg total) by mouth daily. What changed:    when to take this  reasons to take this  additional instructions   amlodipine-atorvastatin 10-20 MG tablet Commonly known as:  CADUET Take 1 tablet by mouth daily.   atorvastatin 20 MG tablet Commonly known as:  LIPITOR Take 1 tablet (20 mg total) by mouth daily at 6 PM.   escitalopram 20 MG tablet Commonly known as:  LEXAPRO Take 1 tablet (20 mg total) by mouth daily.   gabapentin 300 MG capsule Commonly known as:  NEURONTIN Take 1 capsule (300 mg total) by mouth 3 (three) times daily. Gabapentin 300 mg Protocol Take a 300 mg capsule three times a day for two  weeks following surgery. Then take a 300 mg capsule two times a day for two weeks.  Then take a 300 mg capsule once a day for two weeks.  Then discontinue the Gabapentin.   methocarbamol 500 MG tablet Commonly known as:  ROBAXIN Take 1 tablet (500 mg total) by mouth every 6 (six) hours as needed for muscle spasms.   metoprolol succinate 25 MG 24 hr tablet Commonly known as:  TOPROL-XL Take 1 tablet (25 mg total) by mouth daily. What changed:    medication strength  how much to take   oxyCODONE 5 MG immediate release tablet Commonly known as:  Oxy IR/ROXICODONE Take 1-2 tablets (5-10 mg total) by mouth every 6 (six) hours as needed for moderate pain (pain score 4-6).   rivaroxaban 10 MG Tabs tablet Commonly known as:  XARELTO Take 1 tablet (10 mg total) by mouth daily with breakfast. Then resume one 81 mg aspirin once a day.   sildenafil 100 MG tablet Commonly known as:  VIAGRA Take 0.5-1 tablets (50-100  mg total) by mouth daily as needed for erectile dysfunction.   sodium chloride 0.65 % Soln nasal spray Commonly known as:  OCEAN Place 1 spray into both nostrils daily as needed for congestion.            Discharge Care Instructions  (From admission, onward)         Start     Ordered   03/21/18 0000  Weight bearing as tolerated     03/21/18 0710   03/21/18 0000  Change dressing    Comments:  Change dressing on Wednesday, then change the dressing daily with sterile 4 x 4 inch gauze dressing and apply TED hose.   03/21/18 0710         Follow-up Information    Gaynelle Arabian, MD. Schedule an appointment as soon as possible for a visit on 04/04/2018.   Specialty:  Orthopedic Surgery Contact information: [address] [place] [address] [phone]        Burtis Junes, NP Follow up on 03/28/2018.   Specialties:  Nurse Practitioner, Interventional Cardiology, Cardiology, Radiology Why:  2:00 pm hospital follow up Contact information: [place]. [address] [place] [address] [phone]           Signed: Theresa Duty, PA-C Orthopedic Surgery 03/27/2018, 9:26 AM

## 2018-03-27 NOTE — Progress Notes (Signed)
CARDIOLOGY OFFICE NOTE  Date:  03/28/2018    [name] [name] Date of Birth: [birth year] Medical Record #[id number]  PCP:  Mancel Bale, PA-C  Cardiologist:  Jennings Books   Chief Complaint  Patient presents with  . Hypertension  . Atrial Fibrillation  . Cardiac Valve Problem    Post hospital visit - seen for Dr. Tamala Julian    History of Present Illness: [name] [name] is a 67 y.o. male who presents today for a follow up visit. Seen for Dr. Tamala Julian.   He has a history of a bicuspid aortic valve with aortic aneurysm status post Bentall procedure back in 2008 per Dr. [name], MAZE procedure for paroxysmal atrial fibrillation, obesity, obstructive sleep apnea, DM, and hypertension. In the past he has lost over 70# thru the weight loss center at [facility]. His diabetes improved.   Seen back in August by Dr. Tamala Julian - felt to be doing ok. To consider repeat echo at his visit in 2020. He was then cleared for knee surgery thru the preop pool afterwards.   Comes in today. Here alone. Had left TKR earlier this month. Complicated by hypotension while working with PT. Treated with IVF. Cardiology saw him as well. Medicines were adjusted. Norvasc was stopped (he was on Caduet 10/20) Toprol dose was reduced to 25 mg. Has been home now 6 days. He is getting more independent/ambulatory. Did PT yesterday. Went well. BP is already tracking back up. No chest pain. Breathing is ok. He will be on DVT prophylaxis for 3 weeks then back to aspirin. No bleeding or bruising. Overall he feels like he is doing well.   Past Medical History:  Diagnosis Date  . Bicuspid aortic valve    Bentall procedure 2008  . Heart attack ([place])   . Hypertension     Past Surgical History:  Procedure Laterality Date  . CARDIAC CATHETERIZATION    . CARDIAC VALVE REPLACEMENT     Bentall procedure, 2008  . CORONARY ARTERY BYPASS GRAFT  2007  . TONSILLECTOMY     age 21  . TOTAL KNEE ARTHROPLASTY Left 03/20/2018   Procedure: LEFT  TOTAL KNEE ARTHROPLASTY;  Surgeon: Gaynelle Arabian, MD;  Location: [facility];  Service: Orthopedics;  Laterality: Left;  Marland Kitchen VASECTOMY       Medications: Current Meds  Medication Sig  . acetaminophen (TYLENOL) 500 MG tablet Take 1,000 mg by mouth 3 (three) times daily.  Marland Kitchen ALPRAZolam (XANAX) 0.25 MG tablet Take 0.25 mg by mouth daily.  Marland Kitchen atorvastatin (LIPITOR) 20 MG tablet Take 1 tablet (20 mg total) by mouth daily at 6 PM.  . escitalopram (LEXAPRO) 20 MG tablet Take 1 tablet (20 mg total) by mouth daily.  Marland Kitchen gabapentin (NEURONTIN) 300 MG capsule Take 1 capsule (300 mg total) by mouth 3 (three) times daily. Gabapentin 300 mg Protocol Take a 300 mg capsule three times a day for two weeks following surgery. Then take a 300 mg capsule two times a day for two weeks.  Then take a 300 mg capsule once a day for two weeks.  Then discontinue the Gabapentin.  . methocarbamol (ROBAXIN) 500 MG tablet Take 1 tablet (500 mg total) by mouth every 6 (six) hours as needed for muscle spasms.  . metoprolol succinate (TOPROL-XL) 25 MG 24 hr tablet Take 1 tablet (25 mg total) by mouth daily.  Marland Kitchen oxyCODONE (OXY IR/ROXICODONE) 5 MG immediate release tablet Take 1-2 tablets (5-10 mg total) by mouth every 6 (six) hours  as needed for moderate pain (pain score 4-6).  . rivaroxaban (XARELTO) 10 MG TABS tablet Take 1 tablet (10 mg total) by mouth daily with breakfast. Then resume one 81 mg aspirin once a day.  . sildenafil (VIAGRA) 100 MG tablet Take 0.5-1 tablets (50-100 mg total) by mouth daily as needed for erectile dysfunction.  . sodium chloride (OCEAN) 0.65 % SOLN nasal spray Place 1 spray into both nostrils daily as needed for congestion.     Allergies: Allergies  Allergen Reactions  . Amiodarone Rash  . Amlodipine Rash    Social History: The patient  reports that he quit smoking about 14 years ago. He has a 7.50 pack-year smoking history. He has never used smokeless tobacco. He reports that he drank alcohol. He  reports that he does not use drugs.   Family History: The patient's family history includes Atrial fibrillation in his brother and father; COPD in his mother; Cancer in his father, maternal grandfather, and maternal grandmother; Kidney disease in his maternal grandfather and maternal grandmother; Stroke in his paternal grandfather.   Review of Systems: Please see the history of present illness.   Otherwise, the review of systems is positive for none.   All other systems are reviewed and negative.   Physical Exam: VS:  BP 122/80 (BP Location: Left Arm, Patient Position: Sitting, Cuff Size: Large)   Pulse 63   Ht 5\' 10"  (1.778 m)   Wt 241 lb 12.8 oz (109.7 kg)   SpO2 98% Comment: at rest  BMI 34.69 kg/m  .  BMI Body mass index is 34.69 kg/m.  Wt Readings from Last 3 Encounters:  03/28/18 241 lb 12.8 oz (109.7 kg)  03/20/18 245 lb 8 oz (111.4 kg)  03/13/18 245 lb 8 oz (111.4 kg)   BP is 150/70 by me.  General: Pleasant. Well developed, well nourished and in no acute distress.   HEENT: Normal. He does have a mouth droop from a prior injury to his lower right lip.  Neck: Supple, no JVD, carotid bruits, or masses noted.  Cardiac: Regular rate and rhythm. No murmurs, rubs, or gallops. No edema.  Respiratory:  Lungs are clear to auscultation bilaterally with normal work of breathing.  GI: Soft and nontender.  MS: No deformity or atrophy. Gait and ROM intact.  Skin: Warm and dry. Color is normal.  Neuro:  Strength and sensation are intact and no gross focal deficits noted.  Psych: Alert, appropriate and with normal affect.   LABORATORY DATA:  EKG:  EKG is not ordered today.  Lab Results  Component Value Date   WBC 10.6 (H) 03/22/2018   HGB 11.5 (L) 03/22/2018   HCT 35.9 (L) 03/22/2018   PLT 197 03/22/2018   GLUCOSE 116 (H) 03/22/2018   CHOL 128 10/22/2017   TRIG 53 10/22/2017   HDL 50 10/22/2017   LDLCALC 67 10/22/2017   ALT 32 03/13/2018   AST 26 03/13/2018   NA 139  03/22/2018   K 3.9 03/22/2018   CL 108 03/22/2018   CREATININE 0.93 03/22/2018   BUN 23 03/22/2018   CO2 22 03/22/2018   TSH 0.960 10/22/2017   PSA 1.25 08/07/2014   INR 0.89 03/13/2018   HGBA1C 6.0 08/07/2014     BNP (last 3 results) No results for input(s): BNP in the last 8760 hours.  ProBNP (last 3 results) No results for input(s): PROBNP in the last 8760 hours.   Other Studies Reviewed Today:  2D Doppler echocardiogram July 21, 2015:  Study Conclusions  - Left ventricle: The cavity size was normal. There was moderate concentric hypertrophy. Systolic function was normal. The estimated ejection fraction was in the range of 60% to 65%. Wall motion was normal; there were no regional wall motion abnormalities. Features are consistent with a pseudonormal left ventricular filling pattern, with concomitant abnormal relaxation and increased filling pressure (grade 2 diastolic dysfunction). - Ventricular septum: Septal motion showed paradox. - Aortic valve: A bioprosthesis was present and functioning normally. - Aorta: S/P graft repair of the ascending aorta. - Left atrium: The atrium was mildly dilated.  Impressions:  - Prosthetic valve gradients are similar to 2011.    Assessment/Plan:  1. Brief episode of hypotension while working with PT after knee surgery - BP now tracking back up - restarting Norvasc (Caduet 10/20). He will continue to monitor. May need to increase Toprol but will monitor for now.   2. History of PAF - remains in NSR by exam.   3. Prior AVR/Bentall - doing well - to consider echo on his return visit with Dr. Tamala Julian per his last note.   4. Prior weight loss.   5. HLD - on statin with his Norvasc.    Current medicines are reviewed with the patient today.  The patient does not have concerns regarding medicines other than what has been noted above.  The following changes have been made:  See above.  Labs/ tests  ordered today include:   No orders of the defined types were placed in this encounter.    Disposition:   FU with Dr. Tamala Julian as planned in April of 2020.  I am happy to see back as needed.   Patient is agreeable to this plan and will call if any problems develop in the interim.   SignedTruitt Merle, NP  03/28/2018 2:33 PM  [place] [address] [place], [place]  [address] Phone: [phone] Fax: [phone]

## 2018-03-28 ENCOUNTER — Ambulatory Visit: Payer: BLUE CROSS/BLUE SHIELD | Admitting: Nurse Practitioner

## 2018-03-28 ENCOUNTER — Encounter: Payer: Self-pay | Admitting: Nurse Practitioner

## 2018-03-28 VITALS — BP 122/80 | HR 63 | Ht 70.0 in | Wt 241.8 lb

## 2018-03-28 DIAGNOSIS — I10 Essential (primary) hypertension: Secondary | ICD-10-CM

## 2018-03-28 DIAGNOSIS — Z8679 Personal history of other diseases of the circulatory system: Secondary | ICD-10-CM

## 2018-03-28 DIAGNOSIS — Z9889 Other specified postprocedural states: Secondary | ICD-10-CM

## 2018-03-28 DIAGNOSIS — I480 Paroxysmal atrial fibrillation: Secondary | ICD-10-CM | POA: Diagnosis not present

## 2018-03-28 DIAGNOSIS — E782 Mixed hyperlipidemia: Secondary | ICD-10-CM

## 2018-03-28 DIAGNOSIS — Z953 Presence of xenogenic heart valve: Secondary | ICD-10-CM | POA: Diagnosis not present

## 2018-03-28 MED ORDER — AMLODIPINE-ATORVASTATIN 10-20 MG PO TABS: 1.0000 | ORAL_TABLET | Freq: Every day | ORAL | Status: DC

## 2018-03-28 NOTE — Patient Instructions (Addendum)
We will be checking the following labs today - NONE   Medication Instructions:    Continue with your current medicines.   Restart your Norvasc 10 mg a day (Caduet 10/20)   If you need a refill on your cardiac medications before your next appointment, please call your pharmacy.     Testing/Procedures To Be Arranged:  N/A  Follow-Up:   See Dr. Tamala Julian in April 2020    At [facility], you and your health needs are our priority.  As part of our continuing mission to provide you with exceptional heart care, we have created designated Provider Care Teams.  These Care Teams include your primary Cardiologist (physician) and Advanced Practice Providers (APPs -  Physician Assistants and Nurse Practitioners) who all work together to provide you with the care you need, when you need it.  Special Instructions:  . Keep a check on your BP - if it remains elevated - let us know - next step will be to increase the Toprol back up.   Call the [place] office at [phone] if you have any questions, problems or concerns.

## 2018-03-29 DIAGNOSIS — M25562 Pain in left knee: Secondary | ICD-10-CM | POA: Diagnosis not present

## 2018-03-31 DIAGNOSIS — M25562 Pain in left knee: Secondary | ICD-10-CM | POA: Diagnosis not present

## 2018-04-03 DIAGNOSIS — M25562 Pain in left knee: Secondary | ICD-10-CM | POA: Diagnosis not present

## 2018-04-05 DIAGNOSIS — M25562 Pain in left knee: Secondary | ICD-10-CM | POA: Diagnosis not present

## 2018-04-07 DIAGNOSIS — M25562 Pain in left knee: Secondary | ICD-10-CM | POA: Diagnosis not present

## 2018-04-10 DIAGNOSIS — M25562 Pain in left knee: Secondary | ICD-10-CM | POA: Diagnosis not present

## 2018-04-12 DIAGNOSIS — M25562 Pain in left knee: Secondary | ICD-10-CM | POA: Diagnosis not present

## 2018-04-14 DIAGNOSIS — M25562 Pain in left knee: Secondary | ICD-10-CM | POA: Diagnosis not present

## 2018-04-17 DIAGNOSIS — M25562 Pain in left knee: Secondary | ICD-10-CM | POA: Diagnosis not present

## 2018-04-19 DIAGNOSIS — M25562 Pain in left knee: Secondary | ICD-10-CM | POA: Diagnosis not present

## 2018-04-21 DIAGNOSIS — M25562 Pain in left knee: Secondary | ICD-10-CM | POA: Diagnosis not present

## 2018-04-24 DIAGNOSIS — M25562 Pain in left knee: Secondary | ICD-10-CM | POA: Diagnosis not present

## 2018-04-25 DIAGNOSIS — Z96652 Presence of left artificial knee joint: Secondary | ICD-10-CM | POA: Diagnosis not present

## 2018-04-25 DIAGNOSIS — Z471 Aftercare following joint replacement surgery: Secondary | ICD-10-CM | POA: Diagnosis not present

## 2018-05-05 ENCOUNTER — Other Ambulatory Visit: Payer: Self-pay | Admitting: Interventional Cardiology

## 2018-05-22 DIAGNOSIS — F40248 Other situational type phobia: Secondary | ICD-10-CM | POA: Diagnosis not present

## 2018-05-22 DIAGNOSIS — F419 Anxiety disorder, unspecified: Secondary | ICD-10-CM | POA: Diagnosis not present

## 2018-05-22 DIAGNOSIS — M25562 Pain in left knee: Secondary | ICD-10-CM | POA: Diagnosis not present

## 2018-06-15 DIAGNOSIS — M7022 Olecranon bursitis, left elbow: Secondary | ICD-10-CM | POA: Diagnosis not present

## 2018-06-15 DIAGNOSIS — M25522 Pain in left elbow: Secondary | ICD-10-CM | POA: Diagnosis not present

## 2018-06-19 DIAGNOSIS — G4733 Obstructive sleep apnea (adult) (pediatric): Secondary | ICD-10-CM | POA: Diagnosis not present

## 2018-06-21 DIAGNOSIS — M25562 Pain in left knee: Secondary | ICD-10-CM | POA: Diagnosis not present

## 2018-06-23 DIAGNOSIS — M25562 Pain in left knee: Secondary | ICD-10-CM | POA: Diagnosis not present

## 2018-06-27 DIAGNOSIS — M25562 Pain in left knee: Secondary | ICD-10-CM | POA: Diagnosis not present

## 2018-06-29 DIAGNOSIS — M79644 Pain in right finger(s): Secondary | ICD-10-CM | POA: Diagnosis not present

## 2018-07-04 DIAGNOSIS — M25562 Pain in left knee: Secondary | ICD-10-CM | POA: Diagnosis not present

## 2018-07-06 DIAGNOSIS — Z5189 Encounter for other specified aftercare: Secondary | ICD-10-CM | POA: Diagnosis not present

## 2018-07-06 DIAGNOSIS — M79644 Pain in right finger(s): Secondary | ICD-10-CM | POA: Diagnosis not present

## 2018-07-06 DIAGNOSIS — M25562 Pain in left knee: Secondary | ICD-10-CM | POA: Diagnosis not present

## 2018-07-11 ENCOUNTER — Telehealth: Payer: Self-pay

## 2018-07-11 NOTE — Telephone Encounter (Signed)
I have done a Amlodipine-Atorvastatin PA through covermymeds. Key: [name] [name]

## 2018-07-13 DIAGNOSIS — M25562 Pain in left knee: Secondary | ICD-10-CM | POA: Diagnosis not present

## 2018-07-14 NOTE — Telephone Encounter (Addendum)
**Note De-Identified Malvina Schadler Obfuscation** Letter received from [facility] stating that they have denied coverage of the pts Amlodipine-Atorvastatin. Reason: The request does not meet the definition of medical necessity found in the members benefit booklet. This medication is only covered when 2 alternatives medications on the members formulary have been tried and did not work.  In this case, none of the alternative medications have been tried. Alternative medications include (the member should refer to his members formulary) generic amlodipine tablet and generic atorvastatin tablet.  Will forward this message to Dr Tamala Julian and his nurse for advisement to the pt.

## 2018-07-15 NOTE — Telephone Encounter (Signed)
We should start generic amlodipine, and atorvaststin

## 2018-07-17 MED ORDER — ATORVASTATIN CALCIUM 20 MG PO TABS: 20.0000 mg | ORAL_TABLET | Freq: Every day | ORAL | 1 refills | Status: DC

## 2018-07-17 MED ORDER — AMLODIPINE BESYLATE 10 MG PO TABS: 10.0000 mg | ORAL_TABLET | Freq: Every day | ORAL | 3 refills | Status: AC

## 2018-07-17 NOTE — Telephone Encounter (Signed)
Spoke with pt and made him aware that we would have to split his Caduet into Amlodipine and Atorvastatin since his insurance was no longer covering Caduet.  Pt verbalized understanding and was in agreement with this plan.

## 2018-07-18 DIAGNOSIS — M25562 Pain in left knee: Secondary | ICD-10-CM | POA: Diagnosis not present

## 2018-07-20 DIAGNOSIS — M25562 Pain in left knee: Secondary | ICD-10-CM | POA: Diagnosis not present

## 2018-07-21 DIAGNOSIS — Z96652 Presence of left artificial knee joint: Secondary | ICD-10-CM | POA: Diagnosis not present

## 2018-07-21 DIAGNOSIS — Z471 Aftercare following joint replacement surgery: Secondary | ICD-10-CM | POA: Diagnosis not present

## 2018-07-22 ENCOUNTER — Other Ambulatory Visit: Payer: Self-pay | Admitting: Interventional Cardiology

## 2018-07-29 ENCOUNTER — Other Ambulatory Visit: Payer: Self-pay | Admitting: Interventional Cardiology

## 2018-08-01 DIAGNOSIS — M25562 Pain in left knee: Secondary | ICD-10-CM | POA: Diagnosis not present

## 2018-08-03 DIAGNOSIS — M6281 Muscle weakness (generalized): Secondary | ICD-10-CM | POA: Diagnosis not present

## 2018-08-07 DIAGNOSIS — F419 Anxiety disorder, unspecified: Secondary | ICD-10-CM | POA: Diagnosis not present

## 2018-08-07 DIAGNOSIS — F40248 Other situational type phobia: Secondary | ICD-10-CM | POA: Diagnosis not present

## 2018-08-07 DIAGNOSIS — I480 Paroxysmal atrial fibrillation: Secondary | ICD-10-CM | POA: Diagnosis not present

## 2018-08-07 DIAGNOSIS — I10 Essential (primary) hypertension: Secondary | ICD-10-CM | POA: Diagnosis not present

## 2018-08-08 DIAGNOSIS — M25562 Pain in left knee: Secondary | ICD-10-CM | POA: Diagnosis not present

## 2018-08-10 ENCOUNTER — Ambulatory Visit: Payer: BLUE CROSS/BLUE SHIELD | Admitting: Interventional Cardiology

## 2018-08-10 ENCOUNTER — Other Ambulatory Visit: Payer: Self-pay

## 2018-08-10 ENCOUNTER — Encounter: Payer: Self-pay | Admitting: Interventional Cardiology

## 2018-08-10 VITALS — BP 132/84 | HR 63 | Ht 70.0 in | Wt 251.2 lb

## 2018-08-10 DIAGNOSIS — I480 Paroxysmal atrial fibrillation: Secondary | ICD-10-CM

## 2018-08-10 DIAGNOSIS — I10 Essential (primary) hypertension: Secondary | ICD-10-CM

## 2018-08-10 DIAGNOSIS — E782 Mixed hyperlipidemia: Secondary | ICD-10-CM | POA: Diagnosis not present

## 2018-08-10 DIAGNOSIS — Z953 Presence of xenogenic heart valve: Secondary | ICD-10-CM | POA: Diagnosis not present

## 2018-08-10 DIAGNOSIS — Z9889 Other specified postprocedural states: Secondary | ICD-10-CM

## 2018-08-10 DIAGNOSIS — Z8679 Personal history of other diseases of the circulatory system: Secondary | ICD-10-CM

## 2018-08-10 DIAGNOSIS — M25562 Pain in left knee: Secondary | ICD-10-CM | POA: Diagnosis not present

## 2018-08-10 NOTE — Progress Notes (Signed)
Cardiology Office Note:    Date:  08/10/2018   ID:  [name] [name], DOB 15-Jul-1950, MRN [id number]  PCP:  Mancel Bale, PA-C  Cardiologist:  Sinclair Grooms, MD   Referring MD: Mancel Bale, PA-C   Chief Complaint  Patient presents with  . Cardiac Valve Problem    History of Present Illness:    [name] [name] is a 68 y.o. male with a hx of bicuspid aortic valve with aortic aneurysm status post Bentall procedure 2008, maze procedure for paroxysmal atrial fibrillation, obesity, obstructive sleep apnea, and hypertension.  [name] [name] is doing well.  He underwent left knee replacement surgery by Dr.Aluisio with great result.  He has full range of motion.  He is still in rehab improving his balance and walking gait.  With surgery he had no cardiac complications other than hypotension that developed the first time he stood following the operation.  Metoprolol was decreased to 25 mg daily rather than 50 mg daily.  He has felt fine since that time without any cardiopulmonary complaints.  He denies orthopnea, PND, palpitations, syncope.  No lower extremity swelling.  He sleeps well.   Past Medical History:  Diagnosis Date  . Bicuspid aortic valve    Bentall procedure 2008  . Heart attack ([place])   . Hypertension     Past Surgical History:  Procedure Laterality Date  . CARDIAC CATHETERIZATION    . CARDIAC VALVE REPLACEMENT     Bentall procedure, 2008  . CORONARY ARTERY BYPASS GRAFT  2007  . TONSILLECTOMY     age 43  . TOTAL KNEE ARTHROPLASTY Left 03/20/2018   Procedure: LEFT TOTAL KNEE ARTHROPLASTY;  Surgeon: Gaynelle Arabian, MD;  Location: [facility];  Service: Orthopedics;  Laterality: Left;  Marland Kitchen VASECTOMY      Current Medications: Current Meds  Medication Sig  . ALPRAZolam (XANAX) 0.25 MG tablet Take 0.25 mg by mouth daily.  Marland Kitchen amLODipine (NORVASC) 10 MG tablet Take 1 tablet (10 mg total) by mouth daily.  Marland Kitchen atorvastatin (LIPITOR) 20 MG tablet Take 1 tablet (20 mg total) by mouth  daily.  Marland Kitchen escitalopram (LEXAPRO) 20 MG tablet Take 1 tablet (20 mg total) by mouth daily.  . metoprolol succinate (TOPROL-XL) 25 MG 24 hr tablet Take 1 tablet (25 mg total) by mouth daily.  Marland Kitchen oxyCODONE (OXY IR/ROXICODONE) 5 MG immediate release tablet Take 1-2 tablets (5-10 mg total) by mouth every 6 (six) hours as needed for moderate pain (pain score 4-6).  Marland Kitchen sildenafil (VIAGRA) 100 MG tablet Take 0.5-1 tablets (50-100 mg total) by mouth daily as needed for erectile dysfunction.     Allergies:   Amiodarone and Amlodipine   Social History   Socioeconomic History  . Marital status: Married    Spouse name: Dori  . Number of children: 2  . Years of education: College  . Highest education level: Not on file  Occupational History  . Not on file  Social Needs  . Financial resource strain: Not on file  . Food insecurity:    Worry: Not on file    Inability: Not on file  . Transportation needs:    Medical: Not on file    Non-medical: Not on file  Tobacco Use  . Smoking status: Former Smoker    Packs/day: 0.30    Years: 25.00    Pack years: 7.50    Last attempt to quit: 10/04/2003    Years since quitting: 14.8  . Smokeless tobacco: Never Used  Substance and Sexual Activity  . Alcohol use: Not Currently    Alcohol/week: 0.0 standard drinks  . Drug use: No  . Sexual activity: Yes    Partners: Female  Lifestyle  . Physical activity:    Days per week: Not on file    Minutes per session: Not on file  . Stress: Not on file  Relationships  . Social connections:    Talks on phone: Not on file    Gets together: Not on file    Attends religious service: Not on file    Active member of club or organization: Not on file    Attends meetings of clubs or organizations: Not on file    Relationship status: Not on file  Other Topics Concern  . Not on file  Social History Narrative   Patient is married Engineer, drilling) and lives at home with his wife.   Patient has two children.   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   Patient has a college education.     Family History: The patient's family history includes Atrial fibrillation in his brother and father; COPD in his mother; Cancer in his father, maternal grandfather, and maternal grandmother; Kidney disease in his maternal grandfather and maternal grandmother; Stroke in his paternal grandfather.  ROS:   Please see the history of present illness.    Difficulty with balance.  All other systems reviewed and are negative.  EKGs/Labs/Other Studies Reviewed:    The following studies were reviewed today: 2D Doppler echocardiogram performed 07/21/2015: Study Conclusions  - Left ventricle: The cavity size was normal. There was moderate   concentric hypertrophy. Systolic function was normal. The   estimated ejection fraction was in the range of 60% to 65%. Wall   motion was normal; there were no regional wall motion   abnormalities. Features are consistent with a pseudonormal left   ventricular filling pattern, with concomitant abnormal relaxation   and increased filling pressure (grade 2 diastolic dysfunction). - Ventricular septum: Septal motion showed paradox. - Aortic valve: A bioprosthesis was present and functioning   normally. - Aorta: S/P graft repair of the ascending aorta. - Left atrium: The atrium was mildly dilated.  Impressions:  - Prosthetic valve gradients are similar to 2011.  EKG:  EKG sinus bradycardia, 57 bpm.  Poor R wave progression.  Tracing performed 03/21/2018.  Recent Labs: 10/22/2017: TSH 0.960 03/13/2018: ALT 32 03/22/2018: BUN 23; Creatinine, Ser 0.93; Hemoglobin 11.5; Platelets 197; Potassium 3.9; Sodium 139  Recent Lipid Panel    Component Value Date/Time   CHOL 128 10/22/2017 0935   TRIG 53 10/22/2017 0935   HDL 50 10/22/2017 0935   CHOLHDL 2.6 10/22/2017 0935   CHOLHDL 3.4 08/07/2014 1650   VLDL 24 08/07/2014 1650   LDLCALC 67 10/22/2017 0935     Physical Exam:    VS:  BP 132/84   Pulse 63   Ht 5\' 10"  (1.778 m)   Wt 251 lb 3.2 oz (113.9 kg)   SpO2 97%   BMI 36.04 kg/m     Wt Readings from Last 3 Encounters:  08/10/18 251 lb 3.2 oz (113.9 kg)  03/28/18 241 lb 12.8 oz (109.7 kg)  03/20/18 245 lb 8 oz (111.4 kg)     GEN: Bearded.  Age-appropriate appearance.. No acute distress HEENT: Normal NECK: No JVD. LYMPHATICS: No lymphadenopathy CARDIAC: RRR.  2/6 right upper sternal border crescendo decrescendo systolic murmur, no gallop, no edema  VASCULAR: 2+ bilateral radial and carotid pulses, no bruits RESPIRATORY:  Clear to auscultation without rales, wheezing or rhonchi  ABDOMEN: Soft, non-tender, non-distended, No pulsatile mass, MUSCULOSKELETAL: No deformity  SKIN: Warm and dry NEUROLOGIC:  Alert and oriented x 3 PSYCHIATRIC:  Normal affect   ASSESSMENT:    1. S/P aortic valve replacement with bioprosthetic valve   2. PAF (paroxysmal atrial fibrillation) ([place])   3. S/P ascending aortic aneurysm repair   4. HYPERLIPIDEMIA, MIXED   5. Essential hypertension   6. Morbid obesity ([place])    PLAN:    In order of problems listed above:  1. 2D Doppler echocardiogram will be done at some point within the next 12 months to follow-up on aortic valve prosthesis. 2. Not addressed 3. Reevaluate ascending aortic diameter on echo when done later this year. 4. LDL cholesterol target less than 70.  May 2019 value was 67. 5. Target 130/80 mmHg.  We will keep a close eye on the blood pressure and adjust beta-blocker upward if needed.  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 6. We discussed weight loss.  150 minutes of moderate activity per day.  Decrease caloric intake.  Overall education and awareness concerning primary/secondary risk prevention was discussed in detail: LDL less than 70, hemoglobin A1c less than 7, blood pressure target less than  130/80 mmHg, >150 minutes of moderate aerobic activity per week, avoidance of smoking, weight control (via diet and exercise), and continued surveillance/management of/for obstructive sleep apnea.  Clinical follow-up in 1 year.   Medication Adjustments/Labs and Tests Ordered: Current medicines are reviewed at length with the patient today.  Concerns regarding medicines are outlined above.  Orders Placed This Encounter  Procedures  . ECHOCARDIOGRAM COMPLETE   No orders of the defined types were placed in this encounter.   Patient Instructions  Medication Instructions:  Your physician recommends that you continue on your current medications as directed. Please refer to the Current Medication list given to you today.  If you need a refill on your cardiac medications before your next appointment, please call your pharmacy.   Lab work: None If you have labs (blood work) drawn today and your tests are completely normal, you will receive your results only by: Marland Kitchen MyChart Message (if you have MyChart) OR . A paper copy in the mail If you have any lab test that is abnormal or we need to change your treatment, we will call you to review the results.  Testing/Procedures: Your physician has requested that you have an echocardiogram 1-3 months prior to seeing Dr. Tamala Julian back next year. Echocardiography is a painless test that uses sound waves to create images of your heart. It provides your doctor with information about the size and shape of your heart and how well your heart's chambers and valves are working. This procedure takes approximately one hour. There are no restrictions for this procedure.    Follow-Up: At [facility], you and your health needs are our priority.  As part of our continuing mission to provide you with exceptional heart care, we have created designated Provider Care Teams.  These Care Teams include your primary Cardiologist (physician) and Advanced Practice Providers (APPs  -  Physician Assistants and Nurse Practitioners) who all work together to provide you with the care you need, when you need it. You will need a follow up appointment in 12 months.  Please call our office 2 months in  advance to schedule this appointment.  You may see Sinclair Grooms, MD or one of the following Advanced Practice Providers on your designated Care Team:   Truitt Merle, NP Cecilie Kicks, NP . Kathyrn Drown, NP  Any Other Special Instructions Will Be Listed Below (If Applicable).       Signed, Sinclair Grooms, MD  08/10/2018 4:59 PM    [facility]

## 2018-08-10 NOTE — Patient Instructions (Signed)
Medication Instructions:  Your physician recommends that you continue on your current medications as directed. Please refer to the Current Medication list given to you today.  If you need a refill on your cardiac medications before your next appointment, please call your pharmacy.   Lab work: None If you have labs (blood work) drawn today and your tests are completely normal, you will receive your results only by: Marland Kitchen MyChart Message (if you have MyChart) OR . A paper copy in the mail If you have any lab test that is abnormal or we need to change your treatment, we will call you to review the results.  Testing/Procedures: Your physician has requested that you have an echocardiogram 1-3 months prior to seeing Dr. Tamala Julian back next year. Echocardiography is a painless test that uses sound waves to create images of your heart. It provides your doctor with information about the size and shape of your heart and how well your heart's chambers and valves are working. This procedure takes approximately one hour. There are no restrictions for this procedure.    Follow-Up: At [facility], you and your health needs are our priority.  As part of our continuing mission to provide you with exceptional heart care, we have created designated Provider Care Teams.  These Care Teams include your primary Cardiologist (physician) and Advanced Practice Providers (APPs -  Physician Assistants and Nurse Practitioners) who all work together to provide you with the care you need, when you need it. You will need a follow up appointment in 12 months.  Please call our office 2 months in advance to schedule this appointment.  You may see Sinclair Grooms, MD or one of the following Advanced Practice Providers on your designated Care Team:   Truitt Merle, NP Cecilie Kicks, NP . Kathyrn Drown, NP  Any Other Special Instructions Will Be Listed Below (If Applicable).

## 2018-08-15 DIAGNOSIS — M6281 Muscle weakness (generalized): Secondary | ICD-10-CM | POA: Diagnosis not present

## 2018-08-15 DIAGNOSIS — M25562 Pain in left knee: Secondary | ICD-10-CM | POA: Diagnosis not present

## 2018-08-17 DIAGNOSIS — M6281 Muscle weakness (generalized): Secondary | ICD-10-CM | POA: Diagnosis not present

## 2018-08-22 DIAGNOSIS — M6281 Muscle weakness (generalized): Secondary | ICD-10-CM | POA: Diagnosis not present

## 2018-08-24 DIAGNOSIS — M6281 Muscle weakness (generalized): Secondary | ICD-10-CM | POA: Diagnosis not present

## 2018-09-21 DIAGNOSIS — L03114 Cellulitis of left upper limb: Secondary | ICD-10-CM | POA: Diagnosis not present

## 2018-09-21 DIAGNOSIS — M25422 Effusion, left elbow: Secondary | ICD-10-CM | POA: Diagnosis not present

## 2018-09-21 DIAGNOSIS — M25522 Pain in left elbow: Secondary | ICD-10-CM | POA: Diagnosis not present

## 2018-09-21 DIAGNOSIS — M7022 Olecranon bursitis, left elbow: Secondary | ICD-10-CM | POA: Diagnosis not present

## 2018-09-25 DIAGNOSIS — M7022 Olecranon bursitis, left elbow: Secondary | ICD-10-CM | POA: Diagnosis not present

## 2018-09-25 DIAGNOSIS — L03114 Cellulitis of left upper limb: Secondary | ICD-10-CM | POA: Diagnosis not present

## 2018-09-25 DIAGNOSIS — R21 Rash and other nonspecific skin eruption: Secondary | ICD-10-CM | POA: Diagnosis not present

## 2018-09-28 DIAGNOSIS — L03114 Cellulitis of left upper limb: Secondary | ICD-10-CM | POA: Diagnosis not present

## 2018-09-28 DIAGNOSIS — M7022 Olecranon bursitis, left elbow: Secondary | ICD-10-CM | POA: Diagnosis not present

## 2018-10-20 DIAGNOSIS — Z96652 Presence of left artificial knee joint: Secondary | ICD-10-CM | POA: Diagnosis not present

## 2018-10-20 DIAGNOSIS — M1711 Unilateral primary osteoarthritis, right knee: Secondary | ICD-10-CM | POA: Diagnosis not present

## 2018-10-20 DIAGNOSIS — Z471 Aftercare following joint replacement surgery: Secondary | ICD-10-CM | POA: Diagnosis not present

## 2018-10-27 DIAGNOSIS — M1711 Unilateral primary osteoarthritis, right knee: Secondary | ICD-10-CM | POA: Diagnosis not present

## 2018-11-03 DIAGNOSIS — M1711 Unilateral primary osteoarthritis, right knee: Secondary | ICD-10-CM | POA: Diagnosis not present

## 2018-11-08 DIAGNOSIS — M25562 Pain in left knee: Secondary | ICD-10-CM | POA: Diagnosis not present

## 2018-11-08 DIAGNOSIS — R269 Unspecified abnormalities of gait and mobility: Secondary | ICD-10-CM | POA: Diagnosis not present

## 2018-11-27 DIAGNOSIS — R269 Unspecified abnormalities of gait and mobility: Secondary | ICD-10-CM | POA: Diagnosis not present

## 2018-11-27 DIAGNOSIS — M25562 Pain in left knee: Secondary | ICD-10-CM | POA: Diagnosis not present

## 2018-12-04 DIAGNOSIS — M25562 Pain in left knee: Secondary | ICD-10-CM | POA: Diagnosis not present

## 2018-12-04 DIAGNOSIS — R269 Unspecified abnormalities of gait and mobility: Secondary | ICD-10-CM | POA: Diagnosis not present

## 2018-12-13 DIAGNOSIS — M25562 Pain in left knee: Secondary | ICD-10-CM | POA: Diagnosis not present

## 2018-12-13 DIAGNOSIS — R269 Unspecified abnormalities of gait and mobility: Secondary | ICD-10-CM | POA: Diagnosis not present

## 2018-12-20 DIAGNOSIS — M25562 Pain in left knee: Secondary | ICD-10-CM | POA: Diagnosis not present

## 2018-12-20 DIAGNOSIS — R269 Unspecified abnormalities of gait and mobility: Secondary | ICD-10-CM | POA: Diagnosis not present

## 2018-12-21 DIAGNOSIS — G4733 Obstructive sleep apnea (adult) (pediatric): Secondary | ICD-10-CM | POA: Diagnosis not present

## 2018-12-30 DIAGNOSIS — L0390 Cellulitis, unspecified: Secondary | ICD-10-CM

## 2018-12-30 HISTORY — DX: Cellulitis, unspecified: L03.90

## 2019-01-08 ENCOUNTER — Other Ambulatory Visit: Payer: Self-pay

## 2019-01-08 MED ORDER — METOPROLOL SUCCINATE ER 25 MG PO TB24: 25.0000 mg | ORAL_TABLET | Freq: Every day | ORAL | 3 refills | Status: DC

## 2019-01-25 ENCOUNTER — Emergency Department (HOSPITAL_COMMUNITY): Payer: BC Managed Care – PPO

## 2019-01-25 ENCOUNTER — Inpatient Hospital Stay (HOSPITAL_COMMUNITY)
Admission: EM | Admit: 2019-01-25 | Discharge: 2019-02-01 | DRG: 871 | Disposition: A | Discharge status: Home Health | Payer: BC Managed Care – PPO | Attending: Internal Medicine | Admitting: Internal Medicine

## 2019-01-25 ENCOUNTER — Other Ambulatory Visit: Payer: Self-pay

## 2019-01-25 DIAGNOSIS — Z791 Long term (current) use of non-steroidal anti-inflammatories (NSAID): Secondary | ICD-10-CM

## 2019-01-25 DIAGNOSIS — L8990 Pressure ulcer of unspecified site, unspecified stage: Secondary | ICD-10-CM | POA: Diagnosis present

## 2019-01-25 DIAGNOSIS — I4891 Unspecified atrial fibrillation: Secondary | ICD-10-CM | POA: Diagnosis not present

## 2019-01-25 DIAGNOSIS — E785 Hyperlipidemia, unspecified: Secondary | ICD-10-CM | POA: Diagnosis present

## 2019-01-25 DIAGNOSIS — Z96652 Presence of left artificial knee joint: Secondary | ICD-10-CM | POA: Diagnosis not present

## 2019-01-25 DIAGNOSIS — L03116 Cellulitis of left lower limb: Secondary | ICD-10-CM | POA: Diagnosis not present

## 2019-01-25 DIAGNOSIS — A419 Sepsis, unspecified organism: Secondary | ICD-10-CM | POA: Diagnosis not present

## 2019-01-25 DIAGNOSIS — I330 Acute and subacute infective endocarditis: Secondary | ICD-10-CM | POA: Diagnosis not present

## 2019-01-25 DIAGNOSIS — Z87891 Personal history of nicotine dependence: Secondary | ICD-10-CM | POA: Diagnosis not present

## 2019-01-25 DIAGNOSIS — G4733 Obstructive sleep apnea (adult) (pediatric): Secondary | ICD-10-CM | POA: Diagnosis present

## 2019-01-25 DIAGNOSIS — N179 Acute kidney failure, unspecified: Secondary | ICD-10-CM | POA: Diagnosis present

## 2019-01-25 DIAGNOSIS — E860 Dehydration: Secondary | ICD-10-CM | POA: Diagnosis present

## 2019-01-25 DIAGNOSIS — R059 Cough, unspecified: Secondary | ICD-10-CM

## 2019-01-25 DIAGNOSIS — R0682 Tachypnea, not elsewhere classified: Secondary | ICD-10-CM

## 2019-01-25 DIAGNOSIS — R05 Cough: Secondary | ICD-10-CM

## 2019-01-25 DIAGNOSIS — R0981 Nasal congestion: Secondary | ICD-10-CM | POA: Diagnosis not present

## 2019-01-25 DIAGNOSIS — B954 Other streptococcus as the cause of diseases classified elsewhere: Secondary | ICD-10-CM | POA: Diagnosis present

## 2019-01-25 DIAGNOSIS — G4730 Sleep apnea, unspecified: Secondary | ICD-10-CM | POA: Diagnosis present

## 2019-01-25 DIAGNOSIS — E876 Hypokalemia: Secondary | ICD-10-CM | POA: Diagnosis not present

## 2019-01-25 DIAGNOSIS — I491 Atrial premature depolarization: Secondary | ICD-10-CM | POA: Diagnosis not present

## 2019-01-25 DIAGNOSIS — R011 Cardiac murmur, unspecified: Secondary | ICD-10-CM | POA: Diagnosis not present

## 2019-01-25 DIAGNOSIS — B955 Unspecified streptococcus as the cause of diseases classified elsewhere: Secondary | ICD-10-CM | POA: Diagnosis present

## 2019-01-25 DIAGNOSIS — I252 Old myocardial infarction: Secondary | ICD-10-CM | POA: Diagnosis not present

## 2019-01-25 DIAGNOSIS — I339 Acute and subacute endocarditis, unspecified: Secondary | ICD-10-CM | POA: Diagnosis not present

## 2019-01-25 DIAGNOSIS — F419 Anxiety disorder, unspecified: Secondary | ICD-10-CM | POA: Diagnosis present

## 2019-01-25 DIAGNOSIS — R4020 Unspecified coma: Secondary | ICD-10-CM | POA: Diagnosis not present

## 2019-01-25 DIAGNOSIS — Y831 Surgical operation with implant of artificial internal device as the cause of abnormal reaction of the patient, or of later complication, without mention of misadventure at the time of the procedure: Secondary | ICD-10-CM | POA: Diagnosis present

## 2019-01-25 DIAGNOSIS — R7881 Bacteremia: Secondary | ICD-10-CM | POA: Diagnosis not present

## 2019-01-25 DIAGNOSIS — Z823 Family history of stroke: Secondary | ICD-10-CM

## 2019-01-25 DIAGNOSIS — R52 Pain, unspecified: Secondary | ICD-10-CM | POA: Diagnosis not present

## 2019-01-25 DIAGNOSIS — Z20828 Contact with and (suspected) exposure to other viral communicable diseases: Secondary | ICD-10-CM | POA: Diagnosis present

## 2019-01-25 DIAGNOSIS — R238 Other skin changes: Secondary | ICD-10-CM | POA: Diagnosis not present

## 2019-01-25 DIAGNOSIS — R410 Disorientation, unspecified: Secondary | ICD-10-CM | POA: Diagnosis not present

## 2019-01-25 DIAGNOSIS — E871 Hypo-osmolality and hyponatremia: Secondary | ICD-10-CM | POA: Diagnosis not present

## 2019-01-25 DIAGNOSIS — Q231 Congenital insufficiency of aortic valve: Secondary | ICD-10-CM | POA: Diagnosis not present

## 2019-01-25 DIAGNOSIS — L538 Other specified erythematous conditions: Secondary | ICD-10-CM | POA: Diagnosis not present

## 2019-01-25 DIAGNOSIS — Z952 Presence of prosthetic heart valve: Secondary | ICD-10-CM | POA: Diagnosis not present

## 2019-01-25 DIAGNOSIS — R509 Fever, unspecified: Secondary | ICD-10-CM | POA: Diagnosis not present

## 2019-01-25 DIAGNOSIS — Z79899 Other long term (current) drug therapy: Secondary | ICD-10-CM | POA: Diagnosis not present

## 2019-01-25 DIAGNOSIS — R809 Proteinuria, unspecified: Secondary | ICD-10-CM | POA: Diagnosis not present

## 2019-01-25 DIAGNOSIS — Z951 Presence of aortocoronary bypass graft: Secondary | ICD-10-CM

## 2019-01-25 DIAGNOSIS — R404 Transient alteration of awareness: Secondary | ICD-10-CM | POA: Diagnosis not present

## 2019-01-25 DIAGNOSIS — I10 Essential (primary) hypertension: Secondary | ICD-10-CM | POA: Diagnosis present

## 2019-01-25 DIAGNOSIS — R197 Diarrhea, unspecified: Secondary | ICD-10-CM | POA: Diagnosis not present

## 2019-01-25 DIAGNOSIS — E668 Other obesity: Secondary | ICD-10-CM | POA: Diagnosis present

## 2019-01-25 DIAGNOSIS — I350 Nonrheumatic aortic (valve) stenosis: Secondary | ICD-10-CM | POA: Diagnosis not present

## 2019-01-25 DIAGNOSIS — I712 Thoracic aortic aneurysm, without rupture: Secondary | ICD-10-CM | POA: Diagnosis present

## 2019-01-25 DIAGNOSIS — Z888 Allergy status to other drugs, medicaments and biological substances status: Secondary | ICD-10-CM | POA: Diagnosis not present

## 2019-01-25 DIAGNOSIS — Z6838 Body mass index (BMI) 38.0-38.9, adult: Secondary | ICD-10-CM | POA: Diagnosis not present

## 2019-01-25 DIAGNOSIS — Z825 Family history of asthma and other chronic lower respiratory diseases: Secondary | ICD-10-CM

## 2019-01-25 DIAGNOSIS — I480 Paroxysmal atrial fibrillation: Secondary | ICD-10-CM

## 2019-01-25 DIAGNOSIS — R6520 Severe sepsis without septic shock: Secondary | ICD-10-CM | POA: Diagnosis not present

## 2019-01-25 DIAGNOSIS — A401 Sepsis due to streptococcus, group B: Principal | ICD-10-CM | POA: Diagnosis present

## 2019-01-25 DIAGNOSIS — I872 Venous insufficiency (chronic) (peripheral): Secondary | ICD-10-CM | POA: Diagnosis present

## 2019-01-25 DIAGNOSIS — R319 Hematuria, unspecified: Secondary | ICD-10-CM | POA: Diagnosis not present

## 2019-01-25 DIAGNOSIS — Z953 Presence of xenogenic heart valve: Secondary | ICD-10-CM

## 2019-01-25 DIAGNOSIS — R918 Other nonspecific abnormal finding of lung field: Secondary | ICD-10-CM | POA: Diagnosis not present

## 2019-01-25 DIAGNOSIS — Z209 Contact with and (suspected) exposure to unspecified communicable disease: Secondary | ICD-10-CM | POA: Diagnosis not present

## 2019-01-25 DIAGNOSIS — M7989 Other specified soft tissue disorders: Secondary | ICD-10-CM | POA: Diagnosis not present

## 2019-01-25 LAB — COMPREHENSIVE METABOLIC PANEL
ALT: 28 U/L (ref 0–44)
AST: 53 U/L — ABNORMAL HIGH (ref 15–41)
Albumin: 3 g/dL — ABNORMAL LOW (ref 3.5–5.0)
Alkaline Phosphatase: 82 U/L (ref 38–126)
Anion gap: 14 (ref 5–15)
BUN: 30 mg/dL — ABNORMAL HIGH (ref 8–23)
CO2: 18 mmol/L — ABNORMAL LOW (ref 22–32)
Calcium: 8.4 mg/dL — ABNORMAL LOW (ref 8.9–10.3)
Chloride: 97 mmol/L — ABNORMAL LOW (ref 98–111)
Creatinine, Ser: 1.79 mg/dL — ABNORMAL HIGH (ref 0.61–1.24)
GFR calc Af Amer: 44 mL/min — ABNORMAL LOW (ref >60)
GFR calc non Af Amer: 38 mL/min — ABNORMAL LOW (ref >60)
Glucose, Bld: 124 mg/dL — ABNORMAL HIGH (ref 70–99)
Potassium: 3.6 mmol/L (ref 3.5–5.1)
Sodium: 129 mmol/L — ABNORMAL LOW (ref 135–145)
Total Bilirubin: 1.9 mg/dL — ABNORMAL HIGH (ref 0.3–1.2)
Total Protein: 6.5 g/dL (ref 6.5–8.1)

## 2019-01-25 LAB — APTT: aPTT: 37 seconds — ABNORMAL HIGH (ref 24–36)

## 2019-01-25 LAB — CBC WITH DIFFERENTIAL/PLATELET
Abs Immature Granulocytes: 0.16 10*3/uL — ABNORMAL HIGH (ref 0.00–0.07)
Basophils Absolute: 0 10*3/uL (ref 0.0–0.1)
Basophils Relative: 0 %
Eosinophils Absolute: 0 10*3/uL (ref 0.0–0.5)
Eosinophils Relative: 0 %
HCT: 43.9 % (ref 39.0–52.0)
Hemoglobin: 15.1 g/dL (ref 13.0–17.0)
Immature Granulocytes: 1 %
Lymphocytes Relative: 1 %
Lymphs Abs: 0.1 10*3/uL — ABNORMAL LOW (ref 0.7–4.0)
MCH: 30.8 pg (ref 26.0–34.0)
MCHC: 34.4 g/dL (ref 30.0–36.0)
MCV: 89.6 fL (ref 80.0–100.0)
Monocytes Absolute: 0.4 10*3/uL (ref 0.1–1.0)
Monocytes Relative: 3 %
Neutro Abs: 13.6 10*3/uL — ABNORMAL HIGH (ref 1.7–7.7)
Neutrophils Relative %: 95 %
Platelets: 134 10*3/uL — ABNORMAL LOW (ref 150–400)
RBC: 4.9 MIL/uL (ref 4.22–5.81)
RDW: 13.6 % (ref 11.5–15.5)
WBC: 14.3 10*3/uL — ABNORMAL HIGH (ref 4.0–10.5)
nRBC: 0 % (ref 0.0–0.2)

## 2019-01-25 LAB — PROTIME-INR
INR: 1.2 (ref 0.8–1.2)
Prothrombin Time: 15.4 seconds — ABNORMAL HIGH (ref 11.4–15.2)

## 2019-01-25 LAB — LACTIC ACID, PLASMA
Lactic Acid, Venous: 1.4 mmol/L (ref 0.5–1.9)
Lactic Acid, Venous: 2.3 mmol/L (ref 0.5–1.9)

## 2019-01-25 LAB — SARS CORONAVIRUS 2 BY RT PCR (HOSPITAL ORDER, PERFORMED IN ~~LOC~~ HOSPITAL LAB): SARS Coronavirus 2: NEGATIVE

## 2019-01-25 MED ORDER — VANCOMYCIN HCL IN DEXTROSE 1-5 GM/200ML-% IV SOLN
1000.0000 mg | INTRAVENOUS | Status: DC
  Administered 2019-01-26 – 2019-01-27 (×2): 1000 mg via INTRAVENOUS
  Filled 2019-01-25 (×2): qty 200

## 2019-01-25 MED ORDER — ESCITALOPRAM OXALATE 10 MG PO TABS
20.0000 mg | ORAL_TABLET | Freq: Every day | ORAL | Status: DC
  Administered 2019-01-26 – 2019-02-01 (×7): 20 mg via ORAL
  Filled 2019-01-25 (×7): qty 2

## 2019-01-25 MED ORDER — METOPROLOL SUCCINATE ER 25 MG PO TB24
25.0000 mg | ORAL_TABLET | Freq: Every day | ORAL | Status: DC
  Administered 2019-01-26: 11:00:00 25 mg via ORAL
  Filled 2019-01-25: qty 1

## 2019-01-25 MED ORDER — LACTATED RINGERS IV BOLUS
1000.0000 mL | Freq: Once | INTRAVENOUS | Status: AC
  Administered 2019-01-25: 21:00:00 1000 mL via INTRAVENOUS

## 2019-01-25 MED ORDER — ATORVASTATIN CALCIUM 10 MG PO TABS
20.0000 mg | ORAL_TABLET | Freq: Every day | ORAL | Status: DC
  Administered 2019-01-26 – 2019-02-01 (×7): 20 mg via ORAL
  Filled 2019-01-25 (×7): qty 2

## 2019-01-25 MED ORDER — VANCOMYCIN HCL IN DEXTROSE 1-5 GM/200ML-% IV SOLN: 1000.0000 mg | Freq: Once | INTRAVENOUS | Status: DC

## 2019-01-25 MED ORDER — ACETAMINOPHEN 500 MG PO TABS
1000.0000 mg | ORAL_TABLET | Freq: Once | ORAL | Status: AC
  Administered 2019-01-25: 21:00:00 1000 mg via ORAL
  Filled 2019-01-25: qty 2

## 2019-01-25 MED ORDER — SODIUM CHLORIDE 0.9 % IV SOLN
2.0000 g | Freq: Two times a day (BID) | INTRAVENOUS | Status: DC
  Administered 2019-01-26 – 2019-01-28 (×5): 2 g via INTRAVENOUS
  Filled 2019-01-25 (×7): qty 2

## 2019-01-25 MED ORDER — SODIUM CHLORIDE 0.9 % IV SOLN
2.0000 g | Freq: Once | INTRAVENOUS | Status: AC
  Administered 2019-01-25: 21:00:00 2 g via INTRAVENOUS
  Filled 2019-01-25: qty 2

## 2019-01-25 MED ORDER — VANCOMYCIN HCL 10 G IV SOLR
2500.0000 mg | Freq: Once | INTRAVENOUS | Status: AC
  Administered 2019-01-25: 20:00:00 2500 mg via INTRAVENOUS
  Filled 2019-01-25: qty 2500

## 2019-01-25 MED ORDER — METRONIDAZOLE IN NACL 5-0.79 MG/ML-% IV SOLN
500.0000 mg | Freq: Once | INTRAVENOUS | Status: AC
  Administered 2019-01-25: 21:00:00 500 mg via INTRAVENOUS
  Filled 2019-01-25: qty 100

## 2019-01-25 MED ORDER — ALPRAZOLAM 0.5 MG PO TABS
0.5000 mg | ORAL_TABLET | Freq: Every day | ORAL | Status: DC | PRN
  Administered 2019-01-28: 0.5 mg via ORAL
  Filled 2019-01-25: qty 1

## 2019-01-25 NOTE — ED Notes (Signed)
Chaplin pts son wants an update

## 2019-01-25 NOTE — Progress Notes (Signed)
Pharmacy Antibiotic Note  [name] [name] is a 68 y.o. male admitted on 01/25/2019 with sepsis.  Pharmacy has been consulted for vancomycin/cefepime dosing.  Febrile, Tmax 104.1. WBC elevated 14.3. LA elevated 2.3. Scr 1.79, AKI suspected.  Plan: Vancomycin 2500 mg IV loading dose Vancomycin 1000 mg IV Q 24 hrs. Goal AUC 400-550. Expected AUC: 480.4 SCr used: 1.79 Cefepime 2 gm IV q12h Monitor renal fxn, cxs, clinical improvement, and abx de-escalation as needed  Weight: 243 lb (110.2 kg)  Temp (24hrs), Avg:104.1 F (40.1 C), Min:104.1 F (40.1 C), Max:104.1 F (40.1 C)  Recent Labs  Lab 01/25/19 1935  WBC 14.3*  CREATININE 1.79*  LATICACIDVEN 2.3*    Estimated Creatinine Clearance: 48.4 mL/min (A) (by C-G formula based on SCr of 1.79 mg/dL (H)).    Allergies  Allergen Reactions  . Amiodarone Rash  . Amlodipine Rash    Antimicrobials this admission: 8/27 vanc >> 8/27 cefepime >> 8/27 Flagyl >>  Microbiology results: 8/27 BCx: sent 8/27 UCx: sent  Thank you for allowing pharmacy to be a part of this patient's care.  Berenice Bouton, PharmD PGY1 Pharmacy Resident Office phone: [phone] Phone until 9:30 pm: [phone] 01/25/2019 8:33 PM

## 2019-01-25 NOTE — ED Provider Notes (Signed)
[facility] EMERGENCY DEPARTMENT Provider Note   CSN: [id number] Arrival date & time: 01/25/19  1836     History   Chief Complaint Chief Complaint  Patient presents with  . Blood Infection    HPI [name] [name] is a 68 y.o. male.     68yo M w/ PMH including HTN, aortic valve replacement, HLD, A fib, OSA who p/w fevers and AMS. Pt's wife called EMS stating that the patient has been sick for several days, confused,  having difficulty getting out of bed and having to resort to urinating in a bottle.  She reported that he has had dark urine and intermittent fevers for which he has been taking Advil.  Patient notes that he has had difficulty walking because of left leg swelling.  He denies any pain in this leg.He reports the redness and swelling started small and got progressively worse this week. He denies injury. No fever, cough, SOB, CP, abd pain, Vomiting, diarrhea, urinary symptoms, or sick contacts.   LEVEL 5 CAVEAT DUE TO AMS  The history is provided by the spouse and the patient.    Past Medical History:  Diagnosis Date  . Bicuspid aortic valve    Bentall procedure 2008  . Heart attack ([place])   . Hypertension     Patient Active Problem List   Diagnosis Date Noted  . OA (osteoarthritis) of knee 03/20/2018  . Osteoarthritis of knee 06/17/2017  . DOE (dyspnea on exertion) 08/16/2016  . PAF (paroxysmal atrial fibrillation) ([place]) 08/15/2016  . S/P ascending aortic aneurysm repair 03/28/2014  . S/P aortic valve replacement with bioprosthetic valve 03/28/2014  . Hx of maze procedure 03/28/2014  . BMI 38.0-38.9,adult 11/03/2011  . HYPERLIPIDEMIA, MIXED 04/06/2007  . Essential hypertension 04/06/2007  . HERNIATED LUMBAR DISC 04/06/2007  . Sleep apnea 04/06/2007    Past Surgical History:  Procedure Laterality Date  . CARDIAC CATHETERIZATION    . CARDIAC VALVE REPLACEMENT     Bentall procedure, 2008  . CORONARY ARTERY BYPASS GRAFT  2007  .  TONSILLECTOMY     age 34  . TOTAL KNEE ARTHROPLASTY Left 03/20/2018   Procedure: LEFT TOTAL KNEE ARTHROPLASTY;  Surgeon: Gaynelle Arabian, MD;  Location: [facility];  Service: Orthopedics;  Laterality: Left;  Marland Kitchen VASECTOMY          Home Medications    Prior to Admission medications   Medication Sig Start Date End Date Taking? Authorizing Provider  ALPRAZolam Duanne Moron) 0.5 MG tablet Take 0.5 mg by mouth daily as needed. 01/09/19  Yes [provider]  amLODipine (NORVASC) 10 MG tablet Take 1 tablet (10 mg total) by mouth daily. 07/17/18  Yes Belva Crome, MD  atorvastatin (LIPITOR) 20 MG tablet Take 1 tablet (20 mg total) by mouth daily. 07/17/18 01/25/19 Yes Belva Crome, MD  doxycycline (MONODOX) 100 MG capsule Take 100 mg by mouth daily. 09/21/18  Yes [provider]  escitalopram (LEXAPRO) 20 MG tablet Take 1 tablet (20 mg total) by mouth daily. 02/09/18  Yes Weber, Sarah L, PA-C  ibuprofen (ADVIL) 200 MG tablet Take 400 mg by mouth every 6 (six) hours as needed for moderate pain.   Yes [provider]  metoprolol succinate (TOPROL-XL) 25 MG 24 hr tablet Take 1 tablet (25 mg total) by mouth daily. 01/08/19 05/08/19 Yes Belva Crome, MD  oxyCODONE (OXY IR/ROXICODONE) 5 MG immediate release tablet Take 1-2 tablets (5-10 mg total) by mouth every 6 (six) hours as needed for  moderate pain (pain score 4-6). Patient not taking: Reported on 01/25/2019 03/21/18   Theresa Duty L, PA  sildenafil (VIAGRA) 100 MG tablet Take 0.5-1 tablets (50-100 mg total) by mouth daily as needed for erectile dysfunction. Patient not taking: Reported on 01/25/2019 10/25/17   Mancel Bale, PA-C    Family History Family History  Problem Relation Age of Onset  . COPD Mother   . Cancer Father   . Atrial fibrillation Father   . Atrial fibrillation Brother   . Cancer Maternal Grandmother        died at age 55  . Kidney disease Maternal Grandmother   . Cancer Maternal Grandfather   . Kidney  disease Maternal Grandfather   . Stroke Paternal Grandfather     Social History Social History   Tobacco Use  . Smoking status: Former Smoker    Packs/day: 0.30    Years: 25.00    Pack years: 7.50    Quit date: 10/04/2003    Years since quitting: 15.3  . Smokeless tobacco: Never Used  Substance Use Topics  . Alcohol use: Not Currently    Alcohol/week: 0.0 standard drinks  . Drug use: No     Allergies   Amiodarone and Amlodipine   Review of Systems Review of Systems  Unable to perform ROS: Mental status change     Physical Exam Updated Vital Signs BP (!) 143/60   Pulse 92   Temp (!) 104.1 F (40.1 C) (Oral)   Resp 10   Wt 110.2 kg   SpO2 94%   BMI 34.87 kg/m   Physical Exam Vitals signs and nursing note reviewed.  Constitutional:      General: He is not in acute distress.    Appearance: He is well-developed.  HENT:     Head: Normocephalic and atraumatic.  Eyes:     Conjunctiva/sclera: Conjunctivae normal.  Neck:     Musculoskeletal: Neck supple.  Cardiovascular:     Rate and Rhythm: Regular rhythm. Tachycardia present.     Heart sounds: Normal heart sounds. No murmur.  Pulmonary:     Effort: Pulmonary effort is normal.     Breath sounds: Normal breath sounds.  Abdominal:     General: Bowel sounds are normal. There is no distension.     Palpations: Abdomen is soft.     Tenderness: There is no abdominal tenderness.  Musculoskeletal:     Left lower leg: Edema present.     Comments: Edema of L lower leg w/ erythema and warmth over entire anterior lower leg, no crepitus  Skin:    General: Skin is warm and dry.     Findings: Erythema and rash present.  Neurological:     Mental Status: He is alert.     Comments: Fluent speech, mild confusion, following commands      ED Treatments / Results  Labs (all labs ordered are listed, but only abnormal results are displayed) Labs Reviewed  LACTIC ACID, PLASMA - Abnormal; Notable for the following  components:      Result Value   Lactic Acid, Venous 2.3 (*)    All other components within normal limits  COMPREHENSIVE METABOLIC PANEL - Abnormal; Notable for the following components:   Sodium 129 (*)    Chloride 97 (*)    CO2 18 (*)    Glucose, Bld 124 (*)    BUN 30 (*)    Creatinine, Ser 1.79 (*)    Calcium 8.4 (*)    Albumin 3.0 (*)  AST 53 (*)    Total Bilirubin 1.9 (*)    GFR calc non Af Amer 38 (*)    GFR calc Af Amer 44 (*)    All other components within normal limits  CBC WITH DIFFERENTIAL/PLATELET - Abnormal; Notable for the following components:   WBC 14.3 (*)    Platelets 134 (*)    Neutro Abs 13.6 (*)    Lymphs Abs 0.1 (*)    Abs Immature Granulocytes 0.16 (*)    All other components within normal limits  APTT - Abnormal; Notable for the following components:   aPTT 37 (*)    All other components within normal limits  PROTIME-INR - Abnormal; Notable for the following components:   Prothrombin Time 15.4 (*)    All other components within normal limits  CULTURE, BLOOD (ROUTINE X 2)  CULTURE, BLOOD (ROUTINE X 2)  URINE CULTURE  SARS CORONAVIRUS 2 (HOSPITAL ORDER, [place] [facility])  LACTIC ACID, PLASMA  URINALYSIS, ROUTINE W REFLEX MICROSCOPIC  SODIUM, URINE, RANDOM  OSMOLALITY, URINE  OSMOLALITY    EKG EKG Interpretation  Date/Time:  Thursday January 25 2019 19:19:26 EDT Ventricular Rate:  103 PR Interval:    QRS Duration: 100 QT Interval:  312 QTC Calculation: 409 R Axis:   54 Text Interpretation:  Sinus tachycardia Anteroseptal infarct, old Minimal ST elevation, lateral leads rate faster than previous Confirmed by Theotis Burrow [phone]) on 01/25/2019 7:21:52 PM   Radiology Dg Tibia/fibula Left  Result Date: 01/25/2019 CLINICAL DATA:  Leg cellulitis EXAM: LEFT TIBIA AND FIBULA - 2 VIEW COMPARISON:  None. FINDINGS: Status post knee replacement. No fracture or dislocation. No periostitis. Extensive soft tissue edema without soft  tissue emphysema. Soft tissue fullness in the suprapatellar region may reflect effusion IMPRESSION: Knee replacement without acute osseous abnormality. Extensive soft tissue edema without radiopaque foreign body or soft tissue emphysema Electronically Signed   By: Donavan Foil M.D.   On: 01/25/2019 20:08   Dg Chest Port 1 View  Result Date: 01/25/2019 CLINICAL DATA:  Fever and altered mental status EXAM: PORTABLE CHEST 1 VIEW COMPARISON:  06/23/2016 FINDINGS: The heart size and mediastinal contours are within normal limits. Both lungs are clear. The visualized skeletal structures are unremarkable. IMPRESSION: No active disease. Electronically Signed   By: Ulyses Jarred M.D.   On: 01/25/2019 20:07    Procedures .Critical Care Performed by: Sharlett Iles, MD Authorized by: Sharlett Iles, MD   Critical care provider statement:    Critical care time (minutes):  30   Critical care time was exclusive of:  Separately billable procedures and treating other patients   Critical care was necessary to treat or prevent imminent or life-threatening deterioration of the following conditions:  Sepsis   Critical care was time spent personally by me on the following activities:  Development of treatment plan with patient or surrogate, evaluation of patient's response to treatment, examination of patient, obtaining history from patient or surrogate, ordering and performing treatments and interventions, ordering and review of laboratory studies, ordering and review of radiographic studies and re-evaluation of patient's condition   (including critical care time)  Medications Ordered in ED Medications  metroNIDAZOLE (FLAGYL) IVPB 500 mg (500 mg Intravenous New Bag/Given 01/25/19 2110)  vancomycin (VANCOCIN) 2,500 mg in sodium chloride 0.9 % 500 mL IVPB (2,500 mg Intravenous New Bag/Given 01/25/19 2027)  vancomycin (VANCOCIN) IVPB 1000 mg/200 mL premix (has no administration in time range)  ceFEPIme  (MAXIPIME) 2 g in  sodium chloride 0.9 % 100 mL IVPB (has no administration in time range)  ceFEPIme (MAXIPIME) 2 g in sodium chloride 0.9 % 100 mL IVPB (0 g Intravenous Stopped 01/25/19 2102)  lactated ringers bolus 1,000 mL (1,000 mLs Intravenous New Bag/Given 01/25/19 2043)  acetaminophen (TYLENOL) tablet 1,000 mg (1,000 mg Oral Given 01/25/19 2038)  lactated ringers bolus 1,000 mL (1,000 mLs Intravenous New Bag/Given 01/25/19 2043)     Initial Impression / Assessment and Plan / ED Course  I have reviewed the triage vital signs and the nursing notes.  Pertinent labs & imaging results that were available during my care of the patient were reviewed by me and considered in my medical decision making (see chart for details).       PT alert, confused on exam, T 104, mild tachycardia, BP stable. Concern for cellulitis of leg. Initiated code sepsis w/ cultures, broad abx, IVF, as well as tylenol for fever.   Lactate 2.3, sodium 129, CO2 18, creatinine 1.79, WBC 14.3.  No evidence of gas in soft tissues on plain film of the leg.  Chest x-ray clear.  COVID-19 testing has been ordered.  Given the appearance of his leg with associated swelling, I suspect this may be the source of his fever. HEad CT pending.  Discussed admission with internal medicine teaching service and patient admitted for further care. Final Clinical Impressions(s) / ED Diagnoses   Final diagnoses:  Sepsis, due to unspecified organism, unspecified whether acute organ dysfunction present [facility])  Left leg cellulitis  AKI (acute kidney injury) [facility])    ED Discharge Orders    None       Jodie Leiner, Wenda Overland, MD 01/25/19 2138

## 2019-01-25 NOTE — ED Triage Notes (Signed)
[facility] reports wife says pt sick x days, lower left leg red and hot to touch, difficulty getting out of bed. Dark urine. Pt temp 105.1, ST w/ unifocal PVC, Hypertensive 284/90 182/158 CBG 121 RR 55 Capnography 24-27 A&O Pt story is he is stressed with a merger at work, wearing cpap during the day and taking xanax.

## 2019-01-25 NOTE — H&P (Signed)
Date: 01/26/2019               Patient Name:  [name] [name] MRN: [id number]  DOB: 1951/04/09 Age / Sex: 68 y.o., male   PCP: Mancel Bale, PA-C         Medical Service: Internal Medicine Teaching Service         Attending Physician: Dr. Rebeca Alert Raynaldo Opitz, MD    First Contact: Dr. Ronnald Ramp Pager: [phone]  Second Contact: Dr. Shan Levans Pager: [phone]        After Hours (After 5p/  First Contact Pager: [phone]  weekends / holidays): Second Contact Pager: [phone]   Chief Complaint: altered mental status/fever  History of Present Illness: This is a 68 year old male with PMH significant for ascending aortic aneurysm s/p repair (2008), bicuspid aortic valve s/p aortic valve replacement (2008), essential hypertension, hyperlipidemia and sleep apnea. Pt arrives at the ED via EMS follow wife's concern for confusion, weakness and fevers over past several days. Upon arrival to ED, patient is febrile--104F. Patient did not exhibit AMS at time of h/p.  He notes that symptoms began approx 3 days ago and were associated with chills, diaphoresis and general malaise. He also notes some tremors that were alleviated with xanax. Patient implies that tremors could go on for hours at a time without the xanax. He relays occasional loss of bowel and bladder control on a couple of these occurrences however reports being conscious the entirety of the episode and did not bite his tongue and had no eye rolling or jerky extremity movements.  Denies history of seizures however does imply that he has experienced similar type tremors while in a yoga trance for some kind of trauma treatment. He relates his array of symptoms to a LLE wound that has been present for approx 2 weeks. No trauma to the LE. No associated pain. No purulent drainage but has been seeping a clear fluid.  He denies any neck pain/stiffness, change in vision, blurry vision, double vision, cough, sore throat, n/v, diarrhea, abdominal pain.  Meds:   Current Meds  Medication Sig  . ALPRAZolam (XANAX) 0.5 MG tablet Take 0.5 mg by mouth daily as needed.  Marland Kitchen amLODipine (NORVASC) 10 MG tablet Take 1 tablet (10 mg total) by mouth daily.  Marland Kitchen atorvastatin (LIPITOR) 20 MG tablet Take 1 tablet (20 mg total) by mouth daily.  Marland Kitchen escitalopram (LEXAPRO) 20 MG tablet Take 1 tablet (20 mg total) by mouth daily.  Marland Kitchen ibuprofen (ADVIL) 200 MG tablet Take 400 mg by mouth every 6 (six) hours as needed for moderate pain.  . metoprolol succinate (TOPROL-XL) 25 MG 24 hr tablet Take 1 tablet (25 mg total) by mouth daily.     Allergies: Allergies as of 01/25/2019 - Review Complete 01/25/2019  Allergen Reaction Noted  . Amiodarone Rash 10/04/2011  . Amlodipine Rash 08/12/2017   Past Medical History:  Diagnosis Date  . Bicuspid aortic valve    Bentall procedure 2008  . Heart attack ([place])   . Hypertension     Family History:  Family History  Problem Relation Age of Onset  . COPD Mother   . Cancer Father   . Atrial fibrillation Father   . Atrial fibrillation Brother   . Cancer Maternal Grandmother        died at age 47  . Kidney disease Maternal Grandmother   . Cancer Maternal Grandfather   . Kidney disease Maternal Grandfather   . Stroke Paternal Grandfather  Social History:  Resides in [place] with wife. Works as a Air traffic controller.   Review of Systems: Review of Systems  Constitutional: Positive for chills, diaphoresis, fever and malaise/fatigue.  HENT: Negative for congestion, sinus pain and sore throat.   Eyes: Negative for blurred vision, double vision and photophobia.  Respiratory: Negative for cough and shortness of breath.   Cardiovascular: Negative for chest pain, palpitations and orthopnea.  Gastrointestinal: Negative for abdominal pain, diarrhea, nausea and vomiting.  Genitourinary: Negative for dysuria.  Musculoskeletal: Positive for myalgias. Negative for joint pain and neck pain.  Skin: Positive for rash.  Neurological:  Positive for weakness and headaches. Negative for dizziness, seizures and loss of consciousness.  Psychiatric/Behavioral: Negative for depression.     Physical Exam: Blood pressure 102/66, pulse 76, temperature 99.9 F (37.7 C), resp. rate (!) 28, weight 110.2 kg, SpO2 94 %.  GENERAL: in no acute distress. Resting comfortably in bed. HEENT: head atraumatic. No conjunctival injection. Nares patent. White coating on tongue. No oropharyngeal erythema or lesion. CARDIAC: heart RRR. No peripheral edema.  PULMONARY: acyanotic. Lung sounds clear to auscultation. ABDOMEN: soft. Nontender to palpation.  Nondistended NEURO: CN II-XII grossly intact. SKIN: LLE erythematous from ankle to approximately 2 inches below the knee anteriorly. Warm to touch. Large ruptured bullous with clear drainage. See photo from 8/27 media tab. No other similar lesions discovered on remaining skin exam. Neuroma present between 3rd and 4th tarsal of LLE.  PSYCH: A/Ox3. Normal affect  EKG: personally reviewed my interpretation is minimal ST elevation in lateral leads.  CXR: personally reviewed my interpretation is unremarkable  Left lower leg xray: extensive soft tissue edema   CT head: no acute abnormalities  LABS:  WBC 14.3 with left shift. Platelets 134. Lactic Acid 2.3-->1.4  Na 129 (baseline upper 130s), bicarb 18, Cr 1.79 (baseline low 1's) with 38 GFR, total bili 1.9, PT 15.4, INR 1.2 serum osmolality 278, urine osmolality 451, urine sodium <10   Assessment & Plan by Problem: Active Problems:   Hyponatremia  1. LLE cellulitis/ruptured bullous. 2w history. No inciting event. 3d history of fever, chills, malaise, and tremors. Tmax in ED 104F. Blood pressure slightly elevated. No tachycardia. WBC 14.3 with left shift. LA 2.3-->1.4. Symptoms most likely attributable to cellulitis. Decline in renal function and elevated bili likely attributable to dehydration (urine ketones present) and repeat labs in AM will be  able to confirm this. I would be hesitant to call this sepsis at the time. qSOFA score is 1. No other source of infection is present at this time given the negative chest xray. The tremors that patient describes is not overly convincing for seizure given that they would go on for hours and no LOC. I am inclined to think that these were febrile rigors. It is interesting that they improved with xanax however it may have just put him to sleep. Further description from patient's wife will be helpful in delineating these two ideas. Given the warmth and erythema of LLE, cellulitis is most likely. The nature of the LLE lesion is not typical for cellulitis given the bullous nature. This is the only lesion patient has and he denies history of similar lesions. It does somewhat fit the characteristic of a bullous pemphigoid lesion. Other lesions that could present similarly would be scalded skin syndrome (staph.aureus), and TEN (typically associated with sulphonylureas, sulphonamides, NSAIDs, allopurinol and anti-retroviral therapy--none of which patient is taking) (and is typically present on mucosal surfaces).  Plan BC/UC pending. Cefepime + vancomycin.  Received one dose of flagyl in ED. Wound care consulted Repeat CBC, BMP in AM  2. Hematuria, proteinuria. UA reveals large hemoglobin content. Protein 100. Bacteria rare. Last UA 2013 without hgb, protein, bacteria. Likely ATN from systemic inflammation.  3. Hypertension. Continue home amlodipine 10mg  daily, metoprolol 25mg  daily  4. Hyperlipidemia. Continue home atorvastatin 20mg  daily.  5. Anxiety. Continue home xanax 0.5mg  daily prn and lexapro 20mg  daily.  6. History of bicuspid valve and ascending aortic aneurysm s/p mechanical aortic valve and aneurysm repair in 2008. Last echo in 2017 revealed some diastolic dysfunction and normal functioning valve. EF 60-65%.   CODE STATUS: FULL Diet: heart healthy GI prns: phenergan, senokot DVT for prophylaxis:  lovenox Micro: UC, BC ABX: cefepime, vanc. 1xdose of flagyl in ED Social considerations: consider reaching out to wife for further description of tremors as seen fit.     Dispo: Admit patient to Inpatient with expected length of stay greater than 2 midnights.  Signed: Mitzi Hansen, MD 01/26/2019, 2:36 AM  Pager: [phone]

## 2019-01-26 ENCOUNTER — Encounter (HOSPITAL_COMMUNITY): Payer: Self-pay | Admitting: General Practice

## 2019-01-26 DIAGNOSIS — Z20828 Contact with and (suspected) exposure to other viral communicable diseases: Secondary | ICD-10-CM | POA: Diagnosis present

## 2019-01-26 DIAGNOSIS — Z6838 Body mass index (BMI) 38.0-38.9, adult: Secondary | ICD-10-CM | POA: Diagnosis not present

## 2019-01-26 DIAGNOSIS — E860 Dehydration: Secondary | ICD-10-CM | POA: Diagnosis present

## 2019-01-26 DIAGNOSIS — I4891 Unspecified atrial fibrillation: Secondary | ICD-10-CM | POA: Diagnosis present

## 2019-01-26 DIAGNOSIS — Z791 Long term (current) use of non-steroidal anti-inflammatories (NSAID): Secondary | ICD-10-CM | POA: Diagnosis not present

## 2019-01-26 DIAGNOSIS — L538 Other specified erythematous conditions: Secondary | ICD-10-CM | POA: Diagnosis not present

## 2019-01-26 DIAGNOSIS — I350 Nonrheumatic aortic (valve) stenosis: Secondary | ICD-10-CM | POA: Diagnosis not present

## 2019-01-26 DIAGNOSIS — R319 Hematuria, unspecified: Secondary | ICD-10-CM | POA: Diagnosis not present

## 2019-01-26 DIAGNOSIS — G4733 Obstructive sleep apnea (adult) (pediatric): Secondary | ICD-10-CM | POA: Diagnosis present

## 2019-01-26 DIAGNOSIS — R410 Disorientation, unspecified: Secondary | ICD-10-CM | POA: Diagnosis present

## 2019-01-26 DIAGNOSIS — R238 Other skin changes: Secondary | ICD-10-CM | POA: Diagnosis not present

## 2019-01-26 DIAGNOSIS — I252 Old myocardial infarction: Secondary | ICD-10-CM | POA: Diagnosis not present

## 2019-01-26 DIAGNOSIS — N179 Acute kidney failure, unspecified: Secondary | ICD-10-CM | POA: Diagnosis present

## 2019-01-26 DIAGNOSIS — E876 Hypokalemia: Secondary | ICD-10-CM | POA: Diagnosis not present

## 2019-01-26 DIAGNOSIS — L03116 Cellulitis of left lower limb: Secondary | ICD-10-CM | POA: Diagnosis present

## 2019-01-26 DIAGNOSIS — R809 Proteinuria, unspecified: Secondary | ICD-10-CM | POA: Diagnosis not present

## 2019-01-26 DIAGNOSIS — I330 Acute and subacute infective endocarditis: Secondary | ICD-10-CM | POA: Diagnosis present

## 2019-01-26 DIAGNOSIS — A401 Sepsis due to streptococcus, group B: Secondary | ICD-10-CM | POA: Diagnosis present

## 2019-01-26 DIAGNOSIS — Y831 Surgical operation with implant of artificial internal device as the cause of abnormal reaction of the patient, or of later complication, without mention of misadventure at the time of the procedure: Secondary | ICD-10-CM | POA: Diagnosis present

## 2019-01-26 DIAGNOSIS — Z953 Presence of xenogenic heart valve: Secondary | ICD-10-CM | POA: Diagnosis not present

## 2019-01-26 DIAGNOSIS — Q231 Congenital insufficiency of aortic valve: Secondary | ICD-10-CM | POA: Diagnosis not present

## 2019-01-26 DIAGNOSIS — I10 Essential (primary) hypertension: Secondary | ICD-10-CM | POA: Diagnosis present

## 2019-01-26 DIAGNOSIS — I339 Acute and subacute endocarditis, unspecified: Secondary | ICD-10-CM | POA: Diagnosis not present

## 2019-01-26 DIAGNOSIS — Z79899 Other long term (current) drug therapy: Secondary | ICD-10-CM | POA: Diagnosis not present

## 2019-01-26 DIAGNOSIS — B954 Other streptococcus as the cause of diseases classified elsewhere: Secondary | ICD-10-CM | POA: Diagnosis present

## 2019-01-26 DIAGNOSIS — R7881 Bacteremia: Secondary | ICD-10-CM | POA: Diagnosis not present

## 2019-01-26 DIAGNOSIS — E871 Hypo-osmolality and hyponatremia: Secondary | ICD-10-CM | POA: Diagnosis present

## 2019-01-26 DIAGNOSIS — I480 Paroxysmal atrial fibrillation: Secondary | ICD-10-CM | POA: Diagnosis not present

## 2019-01-26 DIAGNOSIS — R52 Pain, unspecified: Secondary | ICD-10-CM | POA: Diagnosis not present

## 2019-01-26 DIAGNOSIS — Z96652 Presence of left artificial knee joint: Secondary | ICD-10-CM | POA: Diagnosis present

## 2019-01-26 DIAGNOSIS — E668 Other obesity: Secondary | ICD-10-CM | POA: Diagnosis present

## 2019-01-26 DIAGNOSIS — F419 Anxiety disorder, unspecified: Secondary | ICD-10-CM | POA: Diagnosis present

## 2019-01-26 DIAGNOSIS — I872 Venous insufficiency (chronic) (peripheral): Secondary | ICD-10-CM | POA: Diagnosis present

## 2019-01-26 DIAGNOSIS — M7989 Other specified soft tissue disorders: Secondary | ICD-10-CM | POA: Diagnosis not present

## 2019-01-26 DIAGNOSIS — E785 Hyperlipidemia, unspecified: Secondary | ICD-10-CM | POA: Diagnosis present

## 2019-01-26 DIAGNOSIS — I712 Thoracic aortic aneurysm, without rupture: Secondary | ICD-10-CM | POA: Diagnosis present

## 2019-01-26 LAB — BLOOD CULTURE ID PANEL (REFLEXED)

## 2019-01-26 LAB — URINALYSIS, ROUTINE W REFLEX MICROSCOPIC
Bilirubin Urine: NEGATIVE
Glucose, UA: NEGATIVE mg/dL
Ketones, ur: 5 mg/dL — AB
Leukocytes,Ua: NEGATIVE
Nitrite: NEGATIVE
Protein, ur: 100 mg/dL — AB
Specific Gravity, Urine: 1.015 (ref 1.005–1.030)
pH: 5 (ref 5.0–8.0)

## 2019-01-26 LAB — OSMOLALITY, URINE: Osmolality, Ur: 451 mOsm/kg (ref 300–900)

## 2019-01-26 LAB — COMPREHENSIVE METABOLIC PANEL
ALT: 40 U/L (ref 0–44)
AST: 109 U/L — ABNORMAL HIGH (ref 15–41)
Albumin: 2.5 g/dL — ABNORMAL LOW (ref 3.5–5.0)
Alkaline Phosphatase: 70 U/L (ref 38–126)
Anion gap: 12 (ref 5–15)
BUN: 28 mg/dL — ABNORMAL HIGH (ref 8–23)
CO2: 21 mmol/L — ABNORMAL LOW (ref 22–32)
Calcium: 8.1 mg/dL — ABNORMAL LOW (ref 8.9–10.3)
Chloride: 100 mmol/L (ref 98–111)
Creatinine, Ser: 1.66 mg/dL — ABNORMAL HIGH (ref 0.61–1.24)
GFR calc Af Amer: 48 mL/min — ABNORMAL LOW (ref >60)
GFR calc non Af Amer: 41 mL/min — ABNORMAL LOW (ref >60)
Glucose, Bld: 122 mg/dL — ABNORMAL HIGH (ref 70–99)
Potassium: 3.6 mmol/L (ref 3.5–5.1)
Sodium: 133 mmol/L — ABNORMAL LOW (ref 135–145)
Total Bilirubin: 1.6 mg/dL — ABNORMAL HIGH (ref 0.3–1.2)
Total Protein: 5.5 g/dL — ABNORMAL LOW (ref 6.5–8.1)

## 2019-01-26 LAB — CBC
HCT: 41.6 % (ref 39.0–52.0)
Hemoglobin: 13.6 g/dL (ref 13.0–17.0)
MCH: 29.6 pg (ref 26.0–34.0)
MCHC: 32.7 g/dL (ref 30.0–36.0)
MCV: 90.4 fL (ref 80.0–100.0)
Platelets: 141 10*3/uL — ABNORMAL LOW (ref 150–400)
RBC: 4.6 MIL/uL (ref 4.22–5.81)
RDW: 13.7 % (ref 11.5–15.5)
WBC: 14.5 10*3/uL — ABNORMAL HIGH (ref 4.0–10.5)
nRBC: 0 % (ref 0.0–0.2)

## 2019-01-26 LAB — CK: Total CK: 3589 U/L — ABNORMAL HIGH (ref 49–397)

## 2019-01-26 LAB — HEMOGLOBIN A1C
Hgb A1c MFr Bld: 5.7 % — ABNORMAL HIGH (ref 4.8–5.6)
Mean Plasma Glucose: 116.89 mg/dL

## 2019-01-26 LAB — OSMOLALITY: Osmolality: 278 mOsm/kg (ref 275–295)

## 2019-01-26 LAB — SODIUM, URINE, RANDOM: Sodium, Ur: <10 mmol/L

## 2019-01-26 LAB — HIV ANTIBODY (ROUTINE TESTING W REFLEX): HIV Screen 4th Generation wRfx: NONREACTIVE

## 2019-01-26 MED ORDER — AMLODIPINE BESYLATE 10 MG PO TABS
10.0000 mg | ORAL_TABLET | Freq: Every day | ORAL | Status: DC
  Administered 2019-01-26: 11:00:00 10 mg via ORAL
  Filled 2019-01-26: qty 1

## 2019-01-26 MED ORDER — DICLOFENAC SODIUM 1 % TD GEL
2.0000 g | Freq: Four times a day (QID) | TRANSDERMAL | Status: DC
  Administered 2019-01-26 – 2019-02-01 (×20): 2 g via TOPICAL
  Filled 2019-01-26 (×2): qty 100

## 2019-01-26 MED ORDER — ENOXAPARIN SODIUM 40 MG/0.4ML ~~LOC~~ SOLN
40.0000 mg | Freq: Every day | SUBCUTANEOUS | Status: DC
  Administered 2019-01-26 – 2019-01-30 (×5): 40 mg via SUBCUTANEOUS
  Filled 2019-01-26 (×5): qty 0.4

## 2019-01-26 MED ORDER — LACTATED RINGERS IV SOLN
INTRAVENOUS | Status: DC
  Administered 2019-01-26: 12:00:00 via INTRAVENOUS
  Administered 2019-01-26: 21:00:00 125 mL/h via INTRAVENOUS
  Administered 2019-01-27: 10:00:00 via INTRAVENOUS

## 2019-01-26 MED ORDER — ACETAMINOPHEN 650 MG RE SUPP: 650.0000 mg | Freq: Four times a day (QID) | RECTAL | Status: DC | PRN

## 2019-01-26 MED ORDER — PROMETHAZINE HCL 25 MG PO TABS: 12.5000 mg | ORAL_TABLET | Freq: Four times a day (QID) | ORAL | Status: DC | PRN

## 2019-01-26 MED ORDER — SENNOSIDES-DOCUSATE SODIUM 8.6-50 MG PO TABS: 1.0000 | ORAL_TABLET | Freq: Every evening | ORAL | Status: DC | PRN

## 2019-01-26 MED ORDER — ACETAMINOPHEN 325 MG PO TABS
650.0000 mg | ORAL_TABLET | Freq: Four times a day (QID) | ORAL | Status: DC | PRN
  Administered 2019-01-26 – 2019-01-30 (×2): 650 mg via ORAL
  Filled 2019-01-26 (×2): qty 2

## 2019-01-26 MED ORDER — OXYCODONE-ACETAMINOPHEN 5-325 MG PO TABS
1.0000 | ORAL_TABLET | Freq: Two times a day (BID) | ORAL | Status: DC | PRN
  Administered 2019-01-26 – 2019-01-28 (×3): 1 via ORAL
  Filled 2019-01-26 (×4): qty 1

## 2019-01-26 NOTE — Plan of Care (Signed)
  Problem: Respiratory: Goal: Ability to maintain adequate ventilation will improve Outcome: Progressing   Problem: Education: Goal: Knowledge of General Education information will improve Description: Including pain rating scale, medication(s)/side effects and non-pharmacologic comfort measures Outcome: Progressing   

## 2019-01-26 NOTE — Consult Note (Signed)
[place] Nurse wound consult note Reason for Consult:LEft leg wound unknown etiology x 2 weeks.  Now with ruptured bullae and pain.  Fever and erythema Wound type:infectious Pressure Injury POA: NA Measurement: Ruptured bullae to left medial lower leg near malleolus:   2 cmx 2.4 cm x 0.2 cm  Wound [id number] red and weeping Drainage (amount, consistency, odor) moderate weeping, painful and erythematous Periwound:edema erythema and pain to left lower leg Dressing procedure/placement/frequency:Cleanse left leg with NS and pat dry.  Apply Xeroform gauze to nonintact lesions.  Cover with kerlix and tape.  Change daily. Will not follow at this time.  Please re-consult if needed.  Domenic Moras MSN, RN, FNP-BC CWON Wound, Ostomy, Continence Nurse Pager [phone]

## 2019-01-26 NOTE — ED Notes (Signed)
ED TO INPATIENT HANDOFF REPORT  ED Nurse Name and Phone #: Caprice Kluver [phone]  S Name/Age/Gender [name] [name] 68 y.o. male Room/Bed: 035C/035C  Code Status   Code Status: Full Code  Home/SNF/Other Home Patient oriented to: self, place, time and situation Is this baseline? Yes   Triage Complete: Triage complete  Chief Complaint sepsis  Triage Note [facility] reports wife says pt sick x days, lower left leg red and hot to touch, difficulty getting out of bed. Dark urine. Pt temp 105.1, ST w/ unifocal PVC, Hypertensive 284/90 182/158 CBG 121 RR 55 Capnography 24-27 A&O Pt story is he is stressed with a merger at work, wearing cpap during the day and taking xanax.    Allergies Allergies  Allergen Reactions  . Amiodarone Rash  . Amlodipine Rash    Level of Care/Admitting Diagnosis ED Disposition    ED Disposition Condition [facility] Area: [place] [100100]  Level of Care: Telemetry Medical [104]  Covid Evaluation: Asymptomatic Screening Protocol (No Symptoms)  Diagnosis: Hyponatremia [id number]  Admitting Physician: Oda Kilts [id number]  Attending Physician: Oda Kilts [id number]  Estimated length of stay: past midnight tomorrow  Certification:: I certify this patient will need inpatient services for at least 2 midnights  PT Class (Do Not Modify): Inpatient [101]  PT Acc Code (Do Not Modify): Private [1]       B Medical/Surgery History Past Medical History:  Diagnosis Date  . Bicuspid aortic valve    [place] procedure 2008  . Heart attack ([place])   . Hypertension    Past Surgical History:  Procedure Laterality Date  . CARDIAC CATHETERIZATION    . CARDIAC VALVE REPLACEMENT     [place] procedure, 2008  . CORONARY ARTERY BYPASS GRAFT  2007  . TONSILLECTOMY     age 39  . TOTAL KNEE ARTHROPLASTY Left 03/20/2018   Procedure: LEFT TOTAL KNEE ARTHROPLASTY;  Surgeon: Gaynelle Arabian, MD;  Location: [facility];  Service:  Orthopedics;  Laterality: Left;  Marland Kitchen VASECTOMY       A IV Location/Drains/Wounds Patient Lines/Drains/Airways Status   Active Line/Drains/Airways    Name:   Placement date:   Placement time:   Site:   Days:   Peripheral IV 01/25/19 Left Antecubital   01/25/19    1839    Antecubital   1   Peripheral IV 01/25/19 Left Hand   01/25/19    1930    Hand   1   Incision (Closed) 03/20/18 Knee   03/20/18    0927     312          Intake/Output Last 24 hours  Intake/Output Summary (Last 24 hours) at 01/26/2019 0219 Last data filed at 01/26/2019 0030 Gross per 24 hour  Intake 2700 ml  Output -  Net 2700 ml    Labs/Imaging Results for orders placed or performed during the hospital encounter of 01/25/19 (from the past 48 hour(s))  Lactic acid, plasma     Status: Abnormal   Collection Time: 01/25/19  7:35 PM  Result Value Ref Range   Lactic Acid, Venous 2.3 (HH) 0.5 - 1.9 mmol/L    Comment: CRITICAL RESULT CALLED TO, READ BACK BY AND VERIFIED WITH: S Toryn Dewalt RN AT 2025 ON [id number] BY Marcos Eke Performed at [facility], [address], [place], [place] [address]   Comprehensive metabolic panel     Status: Abnormal   Collection Time: 01/25/19  7:35 PM  Result  Value Ref Range   Sodium 129 (L) 135 - 145 mmol/L   Potassium 3.6 3.5 - 5.1 mmol/L   Chloride 97 (L) 98 - 111 mmol/L   CO2 18 (L) 22 - 32 mmol/L   Glucose, Bld 124 (H) 70 - 99 mg/dL   BUN 30 (H) 8 - 23 mg/dL   Creatinine, Ser 1.79 (H) 0.61 - 1.24 mg/dL   Calcium 8.4 (L) 8.9 - 10.3 mg/dL   Total Protein 6.5 6.5 - 8.1 g/dL   Albumin 3.0 (L) 3.5 - 5.0 g/dL   AST 53 (H) 15 - 41 U/L   ALT 28 0 - 44 U/L   Alkaline Phosphatase 82 38 - 126 U/L   Total Bilirubin 1.9 (H) 0.3 - 1.2 mg/dL   GFR calc non Af Amer 38 (L) >60 mL/min   GFR calc Af Amer 44 (L) >60 mL/min   Anion gap 14 5 - 15    Comment: Performed at [facility], [place] [address], [place], [place] [address]  CBC WITH DIFFERENTIAL     Status: Abnormal   Collection  Time: 01/25/19  7:35 PM  Result Value Ref Range   WBC 14.3 (H) 4.0 - 10.5 K/uL   RBC 4.90 4.22 - 5.81 MIL/uL   Hemoglobin 15.1 13.0 - 17.0 g/dL   HCT 43.9 39.0 - 52.0 %   MCV 89.6 80.0 - 100.0 fL   MCH 30.8 26.0 - 34.0 pg   MCHC 34.4 30.0 - 36.0 g/dL   RDW 13.6 11.5 - 15.5 %   Platelets 134 (L) 150 - 400 K/uL   nRBC 0.0 0.0 - 0.2 %   Neutrophils Relative % 95 %   Neutro Abs 13.6 (H) 1.7 - 7.7 K/uL   Lymphocytes Relative 1 %   Lymphs Abs 0.1 (L) 0.7 - 4.0 K/uL   Monocytes Relative 3 %   Monocytes Absolute 0.4 0.1 - 1.0 K/uL   Eosinophils Relative 0 %   Eosinophils Absolute 0.0 0.0 - 0.5 K/uL   Basophils Relative 0 %   Basophils Absolute 0.0 0.0 - 0.1 K/uL   Immature Granulocytes 1 %   Abs Immature Granulocytes 0.16 (H) 0.00 - 0.07 K/uL    Comment: Performed at [facility] [address] [place], [place] [address]  APTT     Status: Abnormal   Collection Time: 01/25/19  7:35 PM  Result Value Ref Range   aPTT 37 (H) 24 - 36 seconds    Comment:        IF BASELINE aPTT IS ELEVATED, SUGGEST PATIENT RISK ASSESSMENT BE USED TO DETERMINE APPROPRIATE ANTICOAGULANT THERAPY. Performed at [facility], [place] [address], [place], [place] [address]   Protime-INR     Status: Abnormal   Collection Time: 01/25/19  7:35 PM  Result Value Ref Range   Prothrombin Time 15.4 (H) 11.4 - 15.2 seconds   INR 1.2 0.8 - 1.2    Comment: (NOTE) INR goal varies based on device and disease states. Performed at [facility], [place] [address] [place], [place] [address]   SARS Coronavirus 2 [facility] order, Performed in [facility] hospital lab) Nasopharyngeal Nasopharyngeal Swab     Status: None   Collection Time: 01/25/19  9:51 PM   Specimen: Nasopharyngeal Swab  Result Value Ref Range   SARS Coronavirus 2 NEGATIVE NEGATIVE    Comment: (NOTE) If result is NEGATIVE SARS-CoV-2 target nucleic acids are NOT DETECTED. The SARS-CoV-2 RNA is generally detectable in  upper and lower   respiratory specimens during the acute phase of infection. The lowest  concentration of SARS-CoV-2 viral copies this assay can detect is 250  copies / mL. A negative result does not preclude SARS-CoV-2 infection  and should not be used as the sole basis for treatment or other  patient management decisions.  A negative result may occur with  improper specimen collection / handling, submission of specimen other  than nasopharyngeal swab, presence of viral mutation(s) within the  areas targeted by this assay, and inadequate number of viral copies  (<250 copies / mL). A negative result must be combined with clinical  observations, patient history, and epidemiological information. If result is POSITIVE SARS-CoV-2 target nucleic acids are DETECTED. The SARS-CoV-2 RNA is generally detectable in upper and lower  respiratory specimens dur ing the acute phase of infection.  Positive  results are indicative of active infection with SARS-CoV-2.  Clinical  correlation with patient history and other diagnostic information is  necessary to determine patient infection status.  Positive results do  not rule out bacterial infection or co-infection with other viruses. If result is PRESUMPTIVE POSTIVE SARS-CoV-2 nucleic acids MAY BE PRESENT.   A presumptive positive result was obtained on the submitted specimen  and confirmed on repeat testing.  While 2019 novel coronavirus  (SARS-CoV-2) nucleic acids may be present in the submitted sample  additional confirmatory testing may be necessary for epidemiological  and / or clinical management purposes  to differentiate between  SARS-CoV-2 and other Sarbecovirus currently known to infect humans.  If clinically indicated additional testing with an alternate test  methodology [phone]) is advised. The SARS-CoV-2 RNA is generally  detectable in upper and lower respiratory sp ecimens during the acute  phase of infection. The expected result is Negative. Fact  Sheet for Patients:  StrictlyIdeas.no Fact Sheet for Healthcare Providers: BankingDealers.co.za This test is not yet approved or cleared by the [place] FDA and has been authorized for detection and/or diagnosis of SARS-CoV-2 by FDA under an Emergency Use Authorization (EUA).  This EUA will remain in effect (meaning this test can be used) for the duration of the COVID-19 declaration under Section 564(b)(1) of the Act, 21 U.S.C. section 360bbb-3(b)(1), unless the authorization is terminated or revoked sooner. Performed at [facility], [place] [address], [place], [place] [address]   Osmolality     Status: None   Collection Time: 01/25/19 11:27 PM  Result Value Ref Range   Osmolality 278 275 - 295 mOsm/kg    Comment: Performed at [facility], [place] [address], [place], [place] [address]  Lactic acid, plasma     Status: None   Collection Time: 01/25/19 11:37 PM  Result Value Ref Range   Lactic Acid, Venous 1.4 0.5 - 1.9 mmol/L    Comment: Performed at [facility] [address] [place], [place] [address]  Urinalysis, Routine w reflex microscopic     Status: Abnormal   Collection Time: 01/26/19  1:07 AM  Result Value Ref Range   Color, Urine AMBER (A) YELLOW    Comment: BIOCHEMICALS MAY BE AFFECTED BY COLOR   APPearance CLOUDY (A) CLEAR   Specific Gravity, Urine 1.015 1.005 - 1.030   pH 5.0 5.0 - 8.0   Glucose, UA NEGATIVE NEGATIVE mg/dL   Hgb urine dipstick LARGE (A) NEGATIVE   Bilirubin Urine NEGATIVE NEGATIVE   Ketones, ur 5 (A) NEGATIVE mg/dL   Protein, ur 100 (A) NEGATIVE mg/dL  Nitrite NEGATIVE NEGATIVE   Leukocytes,Ua NEGATIVE NEGATIVE   RBC / HPF 21-50 0 - 5 RBC/hpf   WBC, UA 0-5 0 - 5 WBC/hpf   Bacteria, UA RARE (A) NONE SEEN   Squamous Epithelial / LPF 0-5 0 - 5   Mucus PRESENT    Amorphous Crystal PRESENT     Comment: Performed at [facility], [place] [address], [place], [place] [address]   Sodium, urine, random     Status: None   Collection Time: 01/26/19  1:07 AM  Result Value Ref Range   Sodium, Ur <10 mmol/L    Comment: Performed at [facility] [address] [place], [place] [address]  Osmolality, urine     Status: None   Collection Time: 01/26/19  1:07 AM  Result Value Ref Range   Osmolality, Ur 451 300 - 900 mOsm/kg    Comment: Performed at [facility] [address], [place], [place] [address]   Dg Tibia/fibula Left  Result Date: 01/25/2019 CLINICAL DATA:  Leg cellulitis EXAM: LEFT TIBIA AND FIBULA - 2 VIEW COMPARISON:  None. FINDINGS: Status post knee replacement. No fracture or dislocation. No periostitis. Extensive soft tissue edema without soft tissue emphysema. Soft tissue fullness in the suprapatellar region may reflect effusion IMPRESSION: Knee replacement without acute osseous abnormality. Extensive soft tissue edema without radiopaque foreign body or soft tissue emphysema Electronically Signed   By: Donavan Foil M.D.   On: 01/25/2019 20:08   Ct Head Wo Contrast  Result Date: 01/25/2019 CLINICAL DATA:  Altered level of consciousness EXAM: CT HEAD WITHOUT CONTRAST TECHNIQUE: Contiguous axial images were obtained from the base of the skull through the vertex without intravenous contrast. COMPARISON:  None. FINDINGS: Brain: No acute intracranial abnormality. Specifically, no hemorrhage, hydrocephalus, mass lesion, acute infarction, or significant intracranial injury. Vascular: No hyperdense vessel or unexpected calcification. Skull: No acute calvarial abnormality. Sinuses/Orbits: No acute finding Other: None IMPRESSION: No acute intracranial abnormality. Electronically Signed   By: Rolm Baptise M.D.   On: 01/25/2019 22:49   Dg Chest Port 1 View  Result Date: 01/25/2019 CLINICAL DATA:  Fever and altered mental status EXAM: PORTABLE CHEST 1 VIEW COMPARISON:  06/23/2016 FINDINGS: The heart size and mediastinal contours are within normal limits. Both  lungs are clear. The visualized skeletal structures are unremarkable. IMPRESSION: No active disease. Electronically Signed   By: Ulyses Jarred M.D.   On: 01/25/2019 20:07    Pending Labs Unresulted Labs (From admission, onward)    Start     Ordered   01/26/19 0500  Comprehensive metabolic panel  Tomorrow morning,   R     01/26/19 0027   01/26/19 0500  CBC  Tomorrow morning,   R     01/26/19 0027   01/26/19 0026  HIV antibody (Routine Testing)  Once,   STAT     01/26/19 0027   01/25/19 1857  Blood Culture (routine x 2)  BLOOD CULTURE X 2,   STAT     01/25/19 1857   01/25/19 1857  Urine culture  ONCE - STAT,   STAT     01/25/19 1857          Vitals/Pain Today's Vitals   01/26/19 0030 01/26/19 0045 01/26/19 0115 01/26/19 0139  BP: (!) 111/59 104/60 102/66   Pulse: 72 74 76   Resp: (!) 22 (!) 24 (!) 28   Temp:      TempSrc:      SpO2: 94%  95% 94%   Weight:      PainSc:    0-No pain    Isolation Precautions No active isolations  Medications Medications  vancomycin (VANCOCIN) IVPB 1000 mg/200 mL premix (has no administration in time range)  ceFEPIme (MAXIPIME) 2 g in sodium chloride 0.9 % 100 mL IVPB (has no administration in time range)  ALPRAZolam (XANAX) tablet 0.5 mg (has no administration in time range)  atorvastatin (LIPITOR) tablet 20 mg (has no administration in time range)  escitalopram (LEXAPRO) tablet 20 mg (has no administration in time range)  metoprolol succinate (TOPROL-XL) 24 hr tablet 25 mg (has no administration in time range)  enoxaparin (LOVENOX) injection 40 mg (has no administration in time range)  acetaminophen (TYLENOL) tablet 650 mg (has no administration in time range)    Or  acetaminophen (TYLENOL) suppository 650 mg (has no administration in time range)  senna-docusate (Senokot-S) tablet 1 tablet (has no administration in time range)  promethazine (PHENERGAN) tablet 12.5 mg (has no administration in time range)  amLODipine (NORVASC) tablet 10  mg (has no administration in time range)  ceFEPIme (MAXIPIME) 2 g in sodium chloride 0.9 % 100 mL IVPB (0 g Intravenous Stopped 01/25/19 2102)  metroNIDAZOLE (FLAGYL) IVPB 500 mg (0 mg Intravenous Stopped 01/25/19 2210)  lactated ringers bolus 1,000 mL (0 mLs Intravenous Stopped 01/25/19 2315)  acetaminophen (TYLENOL) tablet 1,000 mg (1,000 mg Oral Given 01/25/19 2038)  vancomycin (VANCOCIN) 2,500 mg in sodium chloride 0.9 % 500 mL IVPB (0 mg Intravenous Stopped 01/26/19 0030)  lactated ringers bolus 1,000 mL (0 mLs Intravenous Stopped 01/25/19 2315)    Mobility walks with device     Focused Assessments Skin   R Recommendations: See Admitting Provider Note  Report given to:   Additional Notes:

## 2019-01-26 NOTE — Progress Notes (Addendum)
Subjective: Pt seen at the bedside this morning. Endorses more L knee joint pain, than pain at the site of the wound. Stays he didn't notice the wound there until EMS told him about it. States he developed what sound like rigors 3-4 days ago, which he took Xanax for. Denies chest pain or shortness of breath.  Objective:  Vital signs in last 24 hours: Vitals:   01/26/19 0115 01/26/19 0257 01/26/19 0312 01/26/19 0500  BP: 102/66 (!) 119/55  (!) 109/53  Pulse: 76 73  77  Resp: (!) 28 18  20   Temp:  98 F (36.7 C)  99.7 F (37.6 C)  TempSrc:  Oral  Oral  SpO2: 94% 95%  95%  Weight:   121.3 kg   Physical Exam Vitals signs and nursing note reviewed.  Constitutional:      General: He is not in acute distress. Cardiovascular:     Rate and Rhythm: Normal rate and regular rhythm.     Pulses:          Dorsalis pedis pulses are 2+ on the right side and 2+ on the left side.     Heart sounds: Murmur present. Systolic murmur present with a grade of 3/6.  Pulmonary:     Effort: Pulmonary effort is normal. No tachypnea or respiratory distress.     Breath sounds: Normal breath sounds.  Abdominal:     General: Abdomen is flat. Bowel sounds are normal. There is no distension.     Palpations: Abdomen is soft.     Tenderness: There is no abdominal tenderness.  Musculoskeletal:     Right lower leg: No edema.     Left lower leg: No edema.     Comments: L knee arthroplasty in 02/2018. L knee pain with flexion.  Skin:    Comments: Large area of erythema on L shin with a ruptured bullae draining honey colored fluid and covered with wound pad. Area is warm, but not tender to palpation.  Neurological:     General: No focal deficit present.     Mental Status: He is alert.    Assessment/Plan: Mr. Stockert is a 68 year old M with significant PMH of bicuspid aortic valve s/p aortic valve replacement (2008), ascending aortic aneurysm s/p repair (2008), L knee replacement (2019), HTN, hyperlipidemia and  OSA who presented with weakness and fevers and found to have LLE wound concerning for cellulitis.   LLE cellulitis with ruptured bullous Pt reports history of rigors and malaise at home, with temperature of 104F in the ED. Not hypotensive, non-tachycardic, with leukocytosis and left shift. No other source of infection identified - CXR negative and no other skin changes seen. - lactate resolved 2.3 > 1.4 - blood cultures growing gram + cocci, susceptibilities pending  - f/u urine cultures  - continue cefepime and vancomycin - wound care consulted - CBC and BMP in AM   AKI Hematuria, proteinuria  UA reveals large Hgb with 21-50 RBCs, protein 100, ketones 5, rare bacteria.  Rhabdomyolysis vs post-strep GN vs dehydration - received 2L LR in the ED - check CK - ordered ASO, and anti-Dnase B   Aortic value and ascending aortic aneurysm repair (2008)   Pt with history of bicuspid valve and ascending aortic aneurysm s/p repair. Echo from 2017 showed some diastolic dysfunction and normal functioning valve with EF 60-65%. - pt thinks his valve replacement was bovine - if pt is bacteremic, will need to check TTE with possible ID consult  Hypertension - continue home amlodipine 10mg  daily, metoprolol 25mg  daily  Hyperlipidemia - continue home atorvastatin 20mg  daily.  Anxiety - continue home xanax 0.5mg  daily PRN and lexapro 20mg  daily   Diet - heart healthy  Fluids - 139mL/hr LR DVT ppx - enoxaparin 40mg  sub Q daily CODE STATUS - FULL CODE   Dispo: Anticipated discharge in approximately 3-4 day(s).   Ladona Horns, MD 01/26/2019, 7:47 AM Pager: [phone]

## 2019-01-26 NOTE — Progress Notes (Signed)
PHARMACY - PHYSICIAN COMMUNICATION CRITICAL VALUE ALERT - BLOOD CULTURE IDENTIFICATION (BCID)  ARMSTRONG LOLLIS is an 68 y.o. male who presented to [facility] on 01/25/2019 with a chief complaint of cellulitis   8/27 Blood culture: one bottle with strep species  Name of physician (or Provider) Contacted: Dr. Ronnald Ramp ([facility])  Current antibiotics: Vancomycin/Cefepime   Changes to prescribed antibiotics recommended:  No changes  No results found for this or any previous visit.  Narda Bonds 01/26/2019  7:17 AM

## 2019-01-26 NOTE — Progress Notes (Signed)
Pts. WIfe Voshon Kerwood [phone] phoned upset requesting a call from the MD concerning update on her husband. Wife states she and her son have requested multiple times for MD to call with no response. Off going RN states MD was contacted multiple times during the day. RN paging on call MD at this time to contact family. Family would like to get an update on patient in the am concerning condition. Iliya Spivack, Katherine Roan

## 2019-01-26 NOTE — Progress Notes (Signed)
Per patients son [name] [name] provider was notified that a report of patient's condition is requested.  I personally call the IM teaching service 5 times to request that they call the family without an update made.  Son called me again at shift change and I notified night nurse of son's request.

## 2019-01-26 NOTE — Evaluation (Signed)
Physical Therapy Evaluation Patient Details Name: [name] [name] MRN: [id number] DOB: 12/24/1950 Today's Date: 01/26/2019   History of Present Illness  Pt is a 68 y/o male admitted secondary to fever, chills, and LLE cellulitis. CT of head negative for acute abnormality. PMH includes HTN, s/p AVR, OSA, and aortic aneurysm s/p repair.   Clinical Impression  Pt admitted secondary to problem above with deficits below. Pt only able to perform bed mobility secondary to pain this session. Required max A to come to sitting and mod A for return to supine. Feel pt will have increased difficulty with mobility tasks and will require post acute rehab. Will continue to follow acutely to maximize functional mobility independence and safety.     Follow Up Recommendations SNF    Equipment Recommendations  None recommended by PT    Recommendations for Other Services       Precautions / Restrictions Precautions Precautions: Fall Restrictions Weight Bearing Restrictions: No      Mobility  Bed Mobility Overal bed mobility: Needs Assistance Bed Mobility: Supine to Sit;Sit to Supine     Supine to sit: Max assist Sit to supine: Mod assist   General bed mobility comments: MAx A for LE assist and trunk elevation to come to sitting. Pt with increased pain in sitting and refusing to stand this time. Required mod A for LE assist for return to supine.   Transfers                 General transfer comment: deferred secondary to pain   Ambulation/Gait                Stairs            Wheelchair Mobility    Modified Rankin (Stroke Patients Only)       Balance Overall balance assessment: Needs assistance Sitting-balance support: No upper extremity supported;Feet supported Sitting balance-Leahy Scale: Fair                                       Pertinent Vitals/Pain Pain Assessment: Faces Faces Pain Scale: Hurts whole lot Pain Location: LLE  Pain  Descriptors / Indicators: Aching;Grimacing;Guarding Pain Intervention(s): Limited activity within patient's tolerance;Monitored during session;Repositioned    Home Living Family/patient expects to be discharged to:: Private residence Living Arrangements: Spouse/significant other Available Help at Discharge: Family Type of Home: House Home Access: Stairs to enter Entrance Stairs-Rails: Right Entrance Stairs-Number of Steps: 4 Home Layout: One level Home Equipment: Shower seat - built in;Bedside commode;Walker - 2 wheels;Cane - single point      Prior Function Level of Independence: Independent with assistive device(s)         Comments: Had been using cane, however, had to switch to RW secondary to unsteadiness.      Hand Dominance        Extremity/Trunk Assessment   Upper Extremity Assessment Upper Extremity Assessment: Defer to OT evaluation    Lower Extremity Assessment Lower Extremity Assessment: RLE deficits/detail;LLE deficits/detail RLE Deficits / Details: Reports R hip pain which limited ROM.  LLE Deficits / Details: Increased redness and swelling noted in L calf. Functional weakness noted as well during bed mobility.     Cervical / Trunk Assessment Cervical / Trunk Assessment: Kyphotic  Communication   Communication: No difficulties  Cognition Arousal/Alertness: Awake/alert Behavior During Therapy: WFL for tasks assessed/performed Overall Cognitive Status: Within Functional Limits for tasks  assessed                                        General Comments General comments (skin integrity, edema, etc.): Educated about LE HEP to perform at home including heel slides, ankle pumps, and quad sets.     Exercises     Assessment/Plan    PT Assessment Patient needs continued PT services  PT Problem List Decreased strength;Decreased activity tolerance;Decreased range of motion;Decreased balance;Decreased mobility;Decreased knowledge of use of  DME;Decreased knowledge of precautions;Pain       PT Treatment Interventions DME instruction;Gait training;Functional mobility training;Therapeutic activities;Therapeutic exercise;Balance training;Patient/family education    PT Goals (Current goals can be found in the Care Plan section)  Acute Rehab PT Goals Patient Stated Goal: to be able to walk better PT Goal Formulation: With patient Time For Goal Achievement: 02/09/19 Potential to Achieve Goals: Good    Frequency Min 2X/week   Barriers to discharge        Co-evaluation               AM-PAC PT "6 Clicks" Mobility  Outcome Measure Help needed turning from your back to your side while in a flat bed without using bedrails?: Total Help needed moving from lying on your back to sitting on the side of a flat bed without using bedrails?: Total Help needed moving to and from a bed to a chair (including a wheelchair)?: Total Help needed standing up from a chair using your arms (e.g., wheelchair or bedside chair)?: Total Help needed to walk in hospital room?: Total Help needed climbing 3-5 steps with a railing? : Total 6 Click Score: 6    End of Session   Activity Tolerance: Patient limited by pain Patient left: in bed;with call bell/phone within reach;with bed alarm set Nurse Communication: Mobility status PT Visit Diagnosis: Difficulty in walking, not elsewhere classified (R26.2);Muscle weakness (generalized) (M62.81);Pain Pain - Right/Left: (LLE and R hip ) Pain - part of body: Leg;Hip    Time: [id number] PT Time Calculation (min) (ACUTE ONLY): 23 min   Charges:   PT Evaluation $PT Eval Moderate Complexity: 1 Mod PT Treatments $Therapeutic Activity: 8-22 mins        Leighton Ruff, PT, DPT  Acute Rehabilitation Services  Pager: [phone] Office: [phone]   Rudean Hitt 01/26/2019, 6:06 PM

## 2019-01-26 NOTE — Progress Notes (Signed)
Pt's temperature is 102.5 resulting in MEWS of yellow 3. I validated my MEWS score after assessing my patient.  I notified provider, IM teaching service of spike in temp, however, pt had an admission temp of 104.1 and temp of 99.6-100 I  The past 24 hrs.  Per IM resident, I gave 650 mg Tylenol and will reassess temperature.

## 2019-01-27 ENCOUNTER — Inpatient Hospital Stay (HOSPITAL_COMMUNITY): Payer: BC Managed Care – PPO

## 2019-01-27 LAB — COMPREHENSIVE METABOLIC PANEL
ALT: 56 U/L — ABNORMAL HIGH (ref 0–44)
AST: 119 U/L — ABNORMAL HIGH (ref 15–41)
Albumin: 2.3 g/dL — ABNORMAL LOW (ref 3.5–5.0)
Alkaline Phosphatase: 69 U/L (ref 38–126)
Anion gap: 12 (ref 5–15)
BUN: 39 mg/dL — ABNORMAL HIGH (ref 8–23)
CO2: 20 mmol/L — ABNORMAL LOW (ref 22–32)
Calcium: 8 mg/dL — ABNORMAL LOW (ref 8.9–10.3)
Chloride: 100 mmol/L (ref 98–111)
Creatinine, Ser: 1.92 mg/dL — ABNORMAL HIGH (ref 0.61–1.24)
GFR calc Af Amer: 40 mL/min — ABNORMAL LOW (ref >60)
GFR calc non Af Amer: 35 mL/min — ABNORMAL LOW (ref >60)
Glucose, Bld: 108 mg/dL — ABNORMAL HIGH (ref 70–99)
Potassium: 3.2 mmol/L — ABNORMAL LOW (ref 3.5–5.1)
Sodium: 132 mmol/L — ABNORMAL LOW (ref 135–145)
Total Bilirubin: 1 mg/dL (ref 0.3–1.2)
Total Protein: 5.6 g/dL — ABNORMAL LOW (ref 6.5–8.1)

## 2019-01-27 LAB — CBC
HCT: 38.2 % — ABNORMAL LOW (ref 39.0–52.0)
Hemoglobin: 13.1 g/dL (ref 13.0–17.0)
MCH: 30 pg (ref 26.0–34.0)
MCHC: 34.3 g/dL (ref 30.0–36.0)
MCV: 87.6 fL (ref 80.0–100.0)
Platelets: 146 10*3/uL — ABNORMAL LOW (ref 150–400)
RBC: 4.36 MIL/uL (ref 4.22–5.81)
RDW: 13.7 % (ref 11.5–15.5)
WBC: 11.9 10*3/uL — ABNORMAL HIGH (ref 4.0–10.5)
nRBC: 0 % (ref 0.0–0.2)

## 2019-01-27 LAB — URINE CULTURE: Culture: NO GROWTH

## 2019-01-27 LAB — ANTISTREPTOLYSIN O TITER: ASO: 144 IU/mL (ref 0.0–200.0)

## 2019-01-27 LAB — CK: Total CK: 1834 U/L — ABNORMAL HIGH (ref 49–397)

## 2019-01-27 LAB — PHOSPHORUS: Phosphorus: 3.1 mg/dL (ref 2.5–4.6)

## 2019-01-27 MED ORDER — LACTATED RINGERS IV BOLUS: 1000.0000 mL | Freq: Once | INTRAVENOUS | Status: DC

## 2019-01-27 MED ORDER — POTASSIUM CHLORIDE CRYS ER 20 MEQ PO TBCR: 20.0000 meq | EXTENDED_RELEASE_TABLET | Freq: Once | ORAL | Status: DC

## 2019-01-27 MED ORDER — POTASSIUM CHLORIDE CRYS ER 20 MEQ PO TBCR
40.0000 meq | EXTENDED_RELEASE_TABLET | Freq: Once | ORAL | Status: AC
  Administered 2019-01-27: 40 meq via ORAL
  Filled 2019-01-27: qty 2

## 2019-01-27 NOTE — Progress Notes (Signed)
Per primary nurse MD paged. Awaiting on call back

## 2019-01-27 NOTE — Progress Notes (Signed)
CSW attempted to acknowledge consult for a SNF and patient stated that he wanted to speak with wife before he was agreeable.   CSW informed patient that she would follow up tomorrow to get his decision.

## 2019-01-27 NOTE — Progress Notes (Addendum)
Wife called - Spoke to wife .    Noticed MEWS score was 4.   Patients RR is elevated and Temp is 100.8.   Percocet given at time for pain  Paged MD to get them to call wife and to give update    Also called RR nurse - will come assess patient

## 2019-01-27 NOTE — Progress Notes (Signed)
MD returned call.  Ask them to call patient wife to give update.   Also let them know about patient MEWS score and pressure wound on bottom.  Explained RR nurse came to assess patient.     Asked if ok to give tylenol again if needed.  They said it was ok to give and they would call wife.   Will continue to monitor V.S

## 2019-01-27 NOTE — Progress Notes (Addendum)
Patient wife called again.   Upset and wants to talk to MD regarding patient plan.    Explain to wife patient has a pressure wound on bottom from sitting on Bedpan.  Let her know we cleansed and applied foam to area.    Charge nurse paged this time.

## 2019-01-27 NOTE — Progress Notes (Signed)
Subjective: Pt seen at the bedside this morning. Continues to endorse L knee pain. Breathing fast in the room though denies shortness of breath. Pulse ox check showed O2 saturation 94 with good wave form.  Objective:  Vital signs in last 24 hours: Vitals:   01/26/19 2056 01/27/19 0008 01/27/19 0300 01/27/19 0336  BP: (!) 103/54 (!) 110/56  117/60  Pulse: 64 72  71  Resp: 16 16  20   Temp: 98.3 F (36.8 C) 98.7 F (37.1 C)  98 F (36.7 C)  TempSrc: Oral Oral  Oral  SpO2: 94% (!) 88%  (!) 89%  Weight:   122.2 kg   Height:      Physical Exam Vitals signs and nursing note reviewed.  Constitutional:      General: He is not in acute distress. Cardiovascular:     Rate and Rhythm: Normal rate and regular rhythm.     Pulses:          Dorsalis pedis pulses are 2+ on the right side and 2+ on the left side.     Heart sounds: Murmur present. Systolic murmur present with a grade of 2/6.  Pulmonary:     Effort: Tachypnea present. No respiratory distress.     Breath sounds: Normal breath sounds. No decreased air movement or transmitted upper airway sounds.     Comments: On RA. O2 saturation 94. Abdominal:     General: Abdomen is flat. Bowel sounds are normal.     Palpations: Abdomen is soft.     Tenderness: There is no abdominal tenderness.  Musculoskeletal:     Right lower leg: No edema.     Left lower leg: No edema.     Comments: L knee non-tender to palpation. Pt states "there are just sore spots."  Skin:    Comments: LLE wound with mildly improved erythema today. Continues to be warm and non-tender. Ruptured bullae no longer draining.  Neurological:     Mental Status: He is alert.      Assessment/Plan: Mr. Rommel is a 68 year old M with significant PMH of bicuspid aortic valve s/p aortic valve replacement (2008), ascending aortic aneurysm s/p repair (2008), L knee replacement (2019), HTN, hyperlipidemia and OSA who presented with weakness and fevers and found to have LLE wound  concerning for cellulitis.   LLE cellulitis with ruptured bullous Pt presented with history of rigors and malaise at home. In the ED, not hypotensive, non-tachycardic, leukocytosis with left shift, and temp of 104F in the ED. No other source of infection identified - CXR negative and no other skin changes seen. Lactate resolved 2.3 > 1.4. - continue cefepime and vancomycin - blood cultures growing gram + cocci, susceptibilities pending  - no growth on urine cultures  - wound care consulted - CBC and BMP in AM   AKI - Cr 1.79 on admission Hematuria, proteinuria  UA reveals large Hgb with 21-50 RBCs, protein 100, ketones 5, rare bacteria.  Rhabdomyolysis vs post-strep GN vs dehydration. Received 2L LR in the ED. - Cr 1.9 today - ordered post-void residual - CK downtrending 3600 > 1800 - ASO not elevated, and anti-Dnase B pending   Aortic value and ascending aortic aneurysm repair (2008)   Pt with history of bicuspid valve and ascending aortic aneurysm s/p repair. Echo from 2017 showed some diastolic dysfunction and normal functioning valve with EF 60-65%. - pt thinks his valve replacement was bovine - if pt is bacteremic, will need to check TTE with  possible ID consult   Hypertension - holding home amlodipine 10mg  daily and metoprolol 25mg  daily  Hyperlipidemia - continue home atorvastatin 20mg  daily.  Anxiety - continue home xanax 0.5mg  daily PRN and lexapro 20mg  daily   Diet - heart healthy  Fluids - 151mL/hr LR DVT ppx - enoxaparin 40mg  sub Q daily CODE STATUS - FULL CODE   Dispo: Anticipated discharge in approximately 3-4 day(s). PT recommending SNF.  Ladona Horns, MD 01/27/2019, 6:26 AM Pager: [phone]

## 2019-01-27 NOTE — Progress Notes (Signed)
Patient having multiple small amounts of diarrhea and abdominal cramping.    Frequently ask to sit on Bed pan.    Explained again that patient has pressure ulcer from sitting on bed pan for long periods of time.   Cannot allow patient to sit on bedpan longer than 15 minutes at a time.    Foam still in place.   Cleansed Peri area and applied barrier cream to rectal area due to frequent stools and cleansing

## 2019-01-27 NOTE — Significant Event (Signed)
Rapid Response Event Note  Overview: MEWS 4   Initial Focused Assessment: Primary nurse called to report a MEWS score of 4. I asked the nurse if she felt I needed to see the patient, she asked that I would. Per nurse, [name] [name] has a fever and his RR is elevated. I asked if the temperature had be treated and per nurse, she had administered 1 Percocet Tab 5/325 for pain. I went to [name] [name], upon arrival, patient was awake, conversant, and pleasant to interact with. He had stated that felt like he had a temperature, had rigors/chills as well. Current oral temperature was 98.9 but his skin was very warm to touch. HR and BP were stable, 92 % on RA, RR was 24-26, not in acute distress, patient was able to talk in full sentences. Lung sounds were clear in the upper fields, diminished in lower fields, but overall good air movement.   Interventions: -- PIV 20 G x 1 inserted (LR and ABX are not compatible, so 2nd PIV was established)  Plan of Care: -- RN to check rectal temperature - if febrile, ask IMTS MD if more APAP can be administered  -- If febrile - externally cool patient as tolerated (lower room temperature, ice packs, cooling blanket etc.) -- Monitor VS - follow up with VS as needed per orders and per MEWS guidelines  -- Encourage IS and OOB if okay with MD  -- Monitor I/O   Event Summary:  Call Time 1058 Arrival Time 1100 End Time 1155  [name] [name] R

## 2019-01-27 NOTE — Progress Notes (Addendum)
Per RR nurse-  Patient feels warm to touch wanted [place] to ask  MD if ok to give dose of tylenol on top of pain medication   Patient had small amount of stool on bottom (liquid)   Per patient feels like he needs to constantly have BM.   Wants to sit on bedpan for long time.   After cleaning site, noticed a pressure wound in area where bedpan was.   Provided education regarding not staying on bedpan for too long (no more than 15 minutes at a time)    Placed foam on area.      Paged MD again to address Vitals and if ok to give another dose of Tylenol if needed.

## 2019-01-28 ENCOUNTER — Inpatient Hospital Stay (HOSPITAL_COMMUNITY): Payer: BC Managed Care – PPO

## 2019-01-28 DIAGNOSIS — I480 Paroxysmal atrial fibrillation: Secondary | ICD-10-CM

## 2019-01-28 DIAGNOSIS — R05 Cough: Secondary | ICD-10-CM

## 2019-01-28 DIAGNOSIS — Z8679 Personal history of other diseases of the circulatory system: Secondary | ICD-10-CM

## 2019-01-28 DIAGNOSIS — R809 Proteinuria, unspecified: Secondary | ICD-10-CM

## 2019-01-28 DIAGNOSIS — R319 Hematuria, unspecified: Secondary | ICD-10-CM

## 2019-01-28 DIAGNOSIS — R197 Diarrhea, unspecified: Secondary | ICD-10-CM

## 2019-01-28 LAB — CULTURE, BLOOD (ROUTINE X 2)

## 2019-01-28 LAB — CBC
HCT: 38 % — ABNORMAL LOW (ref 39.0–52.0)
Hemoglobin: 13 g/dL (ref 13.0–17.0)
MCH: 30.2 pg (ref 26.0–34.0)
MCHC: 34.2 g/dL (ref 30.0–36.0)
MCV: 88.2 fL (ref 80.0–100.0)
Platelets: 154 10*3/uL (ref 150–400)
RBC: 4.31 MIL/uL (ref 4.22–5.81)
RDW: 13.9 % (ref 11.5–15.5)
WBC: 11.2 10*3/uL — ABNORMAL HIGH (ref 4.0–10.5)
nRBC: 0 % (ref 0.0–0.2)

## 2019-01-28 LAB — COMPREHENSIVE METABOLIC PANEL
ALT: 69 U/L — ABNORMAL HIGH (ref 0–44)
AST: 95 U/L — ABNORMAL HIGH (ref 15–41)
Albumin: 2.2 g/dL — ABNORMAL LOW (ref 3.5–5.0)
Alkaline Phosphatase: 64 U/L (ref 38–126)
Anion gap: 10 (ref 5–15)
BUN: 36 mg/dL — ABNORMAL HIGH (ref 8–23)
CO2: 20 mmol/L — ABNORMAL LOW (ref 22–32)
Calcium: 8.1 mg/dL — ABNORMAL LOW (ref 8.9–10.3)
Chloride: 105 mmol/L (ref 98–111)
Creatinine, Ser: 1.46 mg/dL — ABNORMAL HIGH (ref 0.61–1.24)
GFR calc Af Amer: 56 mL/min — ABNORMAL LOW (ref >60)
GFR calc non Af Amer: 48 mL/min — ABNORMAL LOW (ref >60)
Glucose, Bld: 143 mg/dL — ABNORMAL HIGH (ref 70–99)
Potassium: 3.2 mmol/L — ABNORMAL LOW (ref 3.5–5.1)
Sodium: 135 mmol/L (ref 135–145)
Total Bilirubin: 1 mg/dL (ref 0.3–1.2)
Total Protein: 5.5 g/dL — ABNORMAL LOW (ref 6.5–8.1)

## 2019-01-28 LAB — ECHOCARDIOGRAM COMPLETE
Height: 71 in
Weight: 4331.6 oz

## 2019-01-28 LAB — CK: Total CK: 427 U/L — ABNORMAL HIGH (ref 49–397)

## 2019-01-28 LAB — MAGNESIUM: Magnesium: 2.3 mg/dL (ref 1.7–2.4)

## 2019-01-28 MED ORDER — SODIUM CHLORIDE 0.9 % IV SOLN
2.0000 g | INTRAVENOUS | Status: DC
  Administered 2019-01-28 – 2019-01-31 (×17): 2 g via INTRAVENOUS
  Filled 2019-01-28 (×2): qty 2
  Filled 2019-01-28 (×2): qty 2000
  Filled 2019-01-28: qty 2
  Filled 2019-01-28 (×2): qty 2000
  Filled 2019-01-28 (×6): qty 2
  Filled 2019-01-28 (×3): qty 2000
  Filled 2019-01-28 (×2): qty 2
  Filled 2019-01-28 (×3): qty 2000

## 2019-01-28 MED ORDER — GUAIFENESIN ER 600 MG PO TB12
600.0000 mg | ORAL_TABLET | Freq: Two times a day (BID) | ORAL | Status: AC
  Administered 2019-01-28 – 2019-01-29 (×4): 600 mg via ORAL
  Filled 2019-01-28 (×4): qty 1

## 2019-01-28 MED ORDER — IPRATROPIUM-ALBUTEROL 0.5-2.5 (3) MG/3ML IN SOLN
3.0000 mL | Freq: Once | RESPIRATORY_TRACT | Status: AC
  Administered 2019-01-28: 08:00:00 3 mL via RESPIRATORY_TRACT
  Filled 2019-01-28: qty 3

## 2019-01-28 MED ORDER — IPRATROPIUM-ALBUTEROL 0.5-2.5 (3) MG/3ML IN SOLN
3.0000 mL | Freq: Three times a day (TID) | RESPIRATORY_TRACT | Status: DC | PRN
  Administered 2019-01-29: 3 mL via RESPIRATORY_TRACT
  Filled 2019-01-28: qty 3

## 2019-01-28 MED ORDER — FLUTICASONE PROPIONATE 50 MCG/ACT NA SUSP
1.0000 | Freq: Every day | NASAL | Status: DC
  Administered 2019-01-28 – 2019-02-01 (×5): 1 via NASAL
  Filled 2019-01-28: qty 16

## 2019-01-28 MED ORDER — POTASSIUM CHLORIDE CRYS ER 20 MEQ PO TBCR
40.0000 meq | EXTENDED_RELEASE_TABLET | Freq: Once | ORAL | Status: AC
  Administered 2019-01-28: 08:00:00 40 meq via ORAL
  Filled 2019-01-28: qty 2

## 2019-01-28 NOTE — Progress Notes (Signed)
*  PRELIMINARY RESULTS* Echocardiogram 2D Echocardiogram has been performed.  Leavy Cella 01/28/2019, 4:07 PM

## 2019-01-28 NOTE — Progress Notes (Signed)
30 minute phone call with wife about patient's status.  She continues to repeat questions that we have already discussed the answers to.  She says that we are rushing the patient out too early, I tried to explain that social work was merely trying to get ready for his disposition once medically stable and that he would not be sent out before then, that he is not discharged.

## 2019-01-28 NOTE — Progress Notes (Signed)
Noted change in pt left lower leg wound. Blood blister-like area above non-pressure wound. Called MD, informed of situation and requested consult for wound nurse for assessment of change in wound presentation.

## 2019-01-28 NOTE — Progress Notes (Addendum)
   Subjective:  Pt reports some productive cough and congestion that started last night.  Also some diarrhea yesterday.    Objective:  Vital signs in last 24 hours: Vitals:   01/27/19 2307 01/28/19 0043 01/28/19 0200 01/28/19 0534  BP:  132/74  119/73  Pulse:  71  64  Resp:  (!) 22 20 20   Temp:  97.6 F (36.4 C)    TempSrc:  Oral    SpO2: 93% 93%  95%  Weight:    122.8 kg  Height:        Cardiac: normal rate and rhythm, distant heart sounds, no rubs or gallops, no LE edema Pulmonary: predominantly upper lung field wheezing bilaterally, no  tachypnea Anterior left lower leg: mildly improved erythema today. Continues to be warm and non-tender. Ruptured bullae no longer draining Psych: Alert, conversant, in good spirits    Assessment/Plan: [name] [name] is a 68 year old M with significant PMH of bicuspid aortic valve s/p aortic valve replacement (2008), ascending aortic aneurysm s/p repair (2008), L knee replacement (2019), HTN, hyperlipidemia and OSA who presented with weakness and fevers and found to have LLE wound concerning for cellulitis.   LLE cellulitis with ruptured bullous Pt presented with history of rigors and malaise at home. In the ED, not hypotensive, non-tachycardic, leukocytosis with left shift, and temp of 104F in the ED. No other source of infection identified - CXR negative and no other skin changes seen. Lactate resolved 2.3 > 1.4. - now that culture susceptibility returned will narrow to ampicillin - no growth on urine cultures  - wound care  - CBC and BMP in AM   AKI - Cr 1.79 on admission Hematuria, proteinuria  UA reveals large Hgb with 21-50 RBCs, protein 100, ketones 5, rare bacteria.  Rhabdomyolysis vs post-strep GN vs dehydration. Was IVF resuscitated.  No signs of Urinary retention - Cr improved, d/ced IVF last night - CK cleared - ASO not elevated, and anti-Dnase B pending   Aortic value and ascending aortic aneurysm repair (2008)   Pt with  history of bicuspid valve and ascending aortic aneurysm s/p repair. Echo from 2017 showed some diastolic dysfunction and normal functioning valve with EF 60-65%.pt thinks his valve replacement was bovine -will get an echo to evaluate valve start with TTE  Hypertension - holding home amlodipine 10mg  daily and metoprolol 25mg  daily  Hyperlipidemia - continue home atorvastatin 20mg  daily.  Anxiety - continue home xanax 0.5mg  daily PRN and lexapro 20mg  daily  Diarrhea: sounds like pt was constipated before and now having some diarrhea, uncertain if liquid or just loose, could be abx or diet, not receiving laxatives here.  None so far today  -if continues and liquid stool will test to rule out C. Diff given he's on broad spec abx  Congestion/Wheezing: began last night, he is a former smoker, he is on abx doubt a pna has developed however maybe some airway irritation  -mucinex -nebulizer treatment -flonase   Diet - heart healthy  Fluids - none DVT ppx - enoxaparin 40mg  sub Q daily CODE STATUS - FULL CODE   Dispo: Anticipated discharge in approximately 2-3 day(s). PT recommending SNF.  [name] Roan, MD 01/28/2019, 7:16 AM

## 2019-01-28 NOTE — Progress Notes (Signed)
Called CSW - ask Lattie Haw to call wife.   MD has called and Spoke to wife as well this morning.

## 2019-01-28 NOTE — Evaluation (Signed)
Occupational Therapy Evaluation Patient Details Name: [name] [name] MRN: [id number] DOB: 1950-08-14 Today's Date: 01/28/2019    History of Present Illness Pt is a 68 y/o male admitted secondary to fever, chills, and LLE cellulitis. CT of head negative for acute abnormality. PMH includes HTN, s/p AVR, OSA, and aortic aneurysm s/p repair.    Clinical Impression   PTA, pt was living with his wife and was independent with BADLs and using a cane/RW from functional mobility; was participating in OP PT with "left knee weakness". Pt currently requiring Min Guard-Min A for UB ADLs, Max A for LB ADLs, and Min A +2 for functional transfers with RW. Pt presenting with decreased balance, strength, and activity tolerance due to pain at LLE. Pt agreeable and motivated to participate in therapy. Pt will require further acute OT to increase safety and functional performance. Recommend dc to CIR for intensive OT to optimize safety,  Increase independence with ADLs and functional mobility, and decrease caregiver burden.     Follow Up Recommendations  CIR;Supervision/Assistance - 24 hour    Equipment Recommendations  Other (comment)(Defer to next venue)    Recommendations for Other Services Rehab consult;PT consult     Precautions / Restrictions Precautions Precautions: Fall      Mobility Bed Mobility Overal bed mobility: Needs Assistance Bed Mobility: Supine to Sit     Supine to sit: Min guard     General bed mobility comments: Min Guard A for safety and cues for use of rails. Pt able to bring BLEs over to EOB and then pull on bed rails  Transfers Overall transfer level: Needs assistance Equipment used: Rolling walker (2 wheeled) Transfers: Sit to/from [employer] Transfers Sit to Stand: Min assist;From elevated surface Stand pivot transfers: Min assist;+2 safety/equipment       General transfer comment: Min A to power up into standing and then Min A for balance and safety with pivot  to recliner.    Balance Overall balance assessment: Needs assistance Sitting-balance support: No upper extremity supported;Feet supported Sitting balance-Leahy Scale: Fair     Standing balance support: Bilateral upper extremity supported;During functional activity Standing balance-Leahy Scale: Poor Standing balance comment: Reliant on UE support and physical A                           ADL either performed or assessed with clinical judgement   ADL Overall ADL's : Needs assistance/impaired Eating/Feeding: Independent;Sitting   Grooming: Set up;Sitting;Supervision/safety   Upper Body Bathing: Minimal assistance;Sitting   Lower Body Bathing: Maximal assistance;Sit to/from stand   Upper Body Dressing : Min guard;Sitting   Lower Body Dressing: Maximal assistance;Sit to/from stand Lower Body Dressing Details (indicate cue type and reason): Max A for donning socks Toilet Transfer: Minimal assistance;Stand-pivot;RW(simulated to recliner)   Toileting- Clothing Manipulation and Hygiene: Maximal assistance;Sit to/from stand Toileting - Clothing Manipulation Details (indicate cue type and reason): Pt able to maintain standing with Min A and then Max A for peri care. Pt anxious abotu attempting peri care himself and letting go of RW     Functional mobility during ADLs: Minimal assistance;Rolling walker;+2 for safety/equipment(stand pivot only)       Vision         Perception     Praxis      Pertinent Vitals/Pain Pain Assessment: Faces Faces Pain Scale: Hurts whole lot Pain Location: LLE  Pain Descriptors / Indicators: Aching;Grimacing;Guarding Pain Intervention(s): Monitored during session;Limited activity within patient's tolerance;Repositioned  Hand Dominance Left   Extremity/Trunk Assessment Upper Extremity Assessment Upper Extremity Assessment: Overall WFL for tasks assessed   Lower Extremity Assessment Lower Extremity Assessment: Defer to PT  evaluation   Cervical / Trunk Assessment Cervical / Trunk Assessment: Kyphotic   Communication Communication Communication: No difficulties   Cognition Arousal/Alertness: Awake/alert Behavior During Therapy: WFL for tasks assessed/performed Overall Cognitive Status: Within Functional Limits for tasks assessed                                 General Comments: Agreeable to therapy. Axiety with anticipation of pain   General Comments       Exercises     Shoulder Instructions      Home Living Family/patient expects to be discharged to:: Private residence Living Arrangements: Spouse/significant other Available Help at Discharge: Family Type of Home: House Home Access: Stairs to enter Technical brewer of Steps: 4 Entrance Stairs-Rails: Right Home Layout: One level     Bathroom Shower/Tub: Occupational psychologist: Standard Bathroom Accessibility: Yes   Home Equipment: Shower seat - built in;Bedside commode;Walker - 2 wheels;Cane - single point          Prior Functioning/Environment Level of Independence: Independent with assistive device(s)        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 Had been using cane, however, had to switch to RW secondary to unsteadiness.         OT Problem List: Decreased strength;Decreased range of motion;Decreased activity tolerance;Impaired balance (sitting and/or standing);Decreased knowledge of use of DME or AE;Decreased knowledge of precautions;Pain      OT Treatment/Interventions: Self-care/ADL training;Therapeutic exercise;Energy conservation;DME and/or AE instruction;Therapeutic activities;Patient/family education    OT Goals(Current goals can be found in the care plan section) Acute Rehab OT Goals Patient Stated Goal: to be able to walk better OT Goal Formulation: With patient Time For Goal Achievement: 02/11/19 Potential to Achieve Goals: Good  OT Frequency: Min 3X/week    Barriers to D/C:            Co-evaluation              AM-PAC OT "6 Clicks" Daily Activity     Outcome Measure Help from another person eating meals?: None Help from another person taking care of personal grooming?: A Little Help from another person toileting, which includes using toliet, bedpan, or urinal?: A Lot Help from another person bathing (including washing, rinsing, drying)?: A Lot Help from another person to put on and taking off regular upper body clothing?: A Little Help from another person to put on and taking off regular lower body clothing?: A Lot 6 Click Score: 16   End of Session Equipment Utilized During Treatment: Rolling walker;Gait belt Nurse Communication: Mobility status  Activity Tolerance: Patient tolerated treatment well Patient left: in chair;with call bell/phone within reach;with chair alarm set  OT Visit Diagnosis: Unsteadiness on feet (R26.81);Other abnormalities of gait and mobility (R26.89);Muscle weakness (generalized) (M62.81);Pain Pain - Right/Left: Left Pain - part of body: Leg                Time: 0826-0906 OT Time Calculation (min): 40 min Charges:  OT General Charges $OT Visit: 1 Visit OT Evaluation $OT Eval Moderate Complexity: 1 Mod OT Treatments $Self Care/Home Management : 23-37 mins  Rosa Sanchez, OTR/L Acute Rehab Pager: [phone] Office: [place] 01/28/2019, 12:31 PM

## 2019-01-28 NOTE — Plan of Care (Signed)

## 2019-01-28 NOTE — Progress Notes (Signed)
Pt 's wife call , update pt condition, answer her question, she requested social worker and md to call her tomorrow, will follow up and leave note for day shift.

## 2019-01-28 NOTE — Progress Notes (Signed)
Acknowledge consult for SNF and spoke with patient's wife and OT. The recommendation is now CIR.

## 2019-01-29 DIAGNOSIS — Z888 Allergy status to other drugs, medicaments and biological substances status: Secondary | ICD-10-CM

## 2019-01-29 DIAGNOSIS — R011 Cardiac murmur, unspecified: Secondary | ICD-10-CM

## 2019-01-29 DIAGNOSIS — Z96652 Presence of left artificial knee joint: Secondary | ICD-10-CM

## 2019-01-29 DIAGNOSIS — Z952 Presence of prosthetic heart valve: Secondary | ICD-10-CM

## 2019-01-29 DIAGNOSIS — L8990 Pressure ulcer of unspecified site, unspecified stage: Secondary | ICD-10-CM | POA: Diagnosis present

## 2019-01-29 DIAGNOSIS — L03116 Cellulitis of left lower limb: Secondary | ICD-10-CM

## 2019-01-29 DIAGNOSIS — B955 Unspecified streptococcus as the cause of diseases classified elsewhere: Secondary | ICD-10-CM | POA: Diagnosis present

## 2019-01-29 DIAGNOSIS — Z87891 Personal history of nicotine dependence: Secondary | ICD-10-CM

## 2019-01-29 DIAGNOSIS — B954 Other streptococcus as the cause of diseases classified elsewhere: Secondary | ICD-10-CM

## 2019-01-29 DIAGNOSIS — R238 Other skin changes: Secondary | ICD-10-CM

## 2019-01-29 DIAGNOSIS — R7881 Bacteremia: Secondary | ICD-10-CM | POA: Diagnosis present

## 2019-01-29 LAB — CBC
HCT: 37.6 % — ABNORMAL LOW (ref 39.0–52.0)
Hemoglobin: 12.8 g/dL — ABNORMAL LOW (ref 13.0–17.0)
MCH: 29.8 pg (ref 26.0–34.0)
MCHC: 34 g/dL (ref 30.0–36.0)
MCV: 87.6 fL (ref 80.0–100.0)
Platelets: 217 10*3/uL (ref 150–400)
RBC: 4.29 MIL/uL (ref 4.22–5.81)
RDW: 14.4 % (ref 11.5–15.5)
WBC: 9.6 10*3/uL (ref 4.0–10.5)
nRBC: 0 % (ref 0.0–0.2)

## 2019-01-29 LAB — COMPREHENSIVE METABOLIC PANEL
ALT: 93 U/L — ABNORMAL HIGH (ref 0–44)
AST: 94 U/L — ABNORMAL HIGH (ref 15–41)
Albumin: 2.3 g/dL — ABNORMAL LOW (ref 3.5–5.0)
Alkaline Phosphatase: 70 U/L (ref 38–126)
Anion gap: 10 (ref 5–15)
BUN: 30 mg/dL — ABNORMAL HIGH (ref 8–23)
CO2: 21 mmol/L — ABNORMAL LOW (ref 22–32)
Calcium: 8.1 mg/dL — ABNORMAL LOW (ref 8.9–10.3)
Chloride: 106 mmol/L (ref 98–111)
Creatinine, Ser: 1.34 mg/dL — ABNORMAL HIGH (ref 0.61–1.24)
GFR calc Af Amer: >60 mL/min (ref >60)
GFR calc non Af Amer: 54 mL/min — ABNORMAL LOW (ref >60)
Glucose, Bld: 115 mg/dL — ABNORMAL HIGH (ref 70–99)
Potassium: 3.1 mmol/L — ABNORMAL LOW (ref 3.5–5.1)
Sodium: 137 mmol/L (ref 135–145)
Total Bilirubin: 0.9 mg/dL (ref 0.3–1.2)
Total Protein: 5.7 g/dL — ABNORMAL LOW (ref 6.5–8.1)

## 2019-01-29 MED ORDER — SODIUM CHLORIDE 0.9 % IV SOLN
INTRAVENOUS | Status: DC
  Administered 2019-01-29 – 2019-01-30 (×3): via INTRAVENOUS

## 2019-01-29 MED ORDER — POTASSIUM CHLORIDE CRYS ER 20 MEQ PO TBCR
40.0000 meq | EXTENDED_RELEASE_TABLET | Freq: Two times a day (BID) | ORAL | Status: AC
  Administered 2019-01-29 (×2): 40 meq via ORAL
  Filled 2019-01-29 (×2): qty 2

## 2019-01-29 NOTE — Consult Note (Signed)
[facility]    Date of Admission:  01/25/2019   Total days of antibiotics 5        Day 2 ampicillin              Reason for Consult: Left leg cellulitis complicated by streptococcal bacteremia    Referring Provider: Ladona Horns, MD  Assessment: He has left leg cellulitis complicated by group G streptococcal bacteremia.  He is improving and has now defervesced.  I talked to him about the small but significant possibility of infection of his bioprosthetic aortic valve.  He favors proceeding with TEE.  If the TEE does not show any evidence of endocarditis he can probably stop all antibiotic therapy in the near future and be discharged home.  I do not see any involvement of his left total knee arthroplasty.  Plan: 1. Continue ampicillin 2. Await results of repeat blood cultures 3. Recommend TEE  Principal Problem:   Streptococcal bacteremia Active Problems:   S/P aortic valve replacement with bioprosthetic valve   Cellulitis of leg without foot, left   S/P total knee arthroplasty, left   Essential hypertension   Sleep apnea   BMI 38.0-38.9,adult   Hyponatremia   AKI (acute kidney injury) ([place])   Pressure injury of skin   Scheduled Meds: . atorvastatin  20 mg Oral Daily  . diclofenac sodium  2 g Topical QID  . enoxaparin (LOVENOX) injection  40 mg Subcutaneous Daily  . escitalopram  20 mg Oral Daily  . fluticasone  1 spray Each Nare Daily  . guaiFENesin  600 mg Oral BID  . potassium chloride  40 mEq Oral BID   Continuous Infusions: . ampicillin (OMNIPEN) IV 2 g (01/29/19 1243)   PRN Meds:.acetaminophen **OR** acetaminophen, ALPRAZolam, [COMPLETED] ipratropium-albuterol **FOLLOWED BY** ipratropium-albuterol, oxyCODONE-acetaminophen, promethazine, senna-docusate  HPI: [name] [name] is a 68 y.o. male who developed fever and chills a little over 1 week ago.  His wife became very concerned that he might have COVID-19 and finally convinced him  to come to the hospital.  He had a temperature of 104.1 degrees.  It quickly became obvious that he had left leg cellulitis.  He was started on broad empiric antibiotic therapy.  Admission blood cultures have grown group G Streptococcus and his antibiotic therapy was narrowed to ampicillin.  He has defervesced and is feeling better.  He had aortic valve replacement and aortic root repair 12 years ago by Dr. Lilly Cove.  TTE did not reveal any evidence of endocarditis.  He also had a left total knee arthroplasty last October by Dr. Gaynelle Arabian.  He has not had any new pain or swelling in his left knee.   Review of Systems: Review of Systems  Constitutional: Negative for chills and fever.  Respiratory: Negative for cough, sputum production and shortness of breath.   Cardiovascular: Negative for chest pain.  Gastrointestinal: Negative for abdominal pain, diarrhea, nausea and vomiting.  Musculoskeletal: Negative for joint pain.    Past Medical History:  Diagnosis Date  . Bicuspid aortic valve    [place] procedure 2008  . Cellulitis 12/2018   LEFT LOWER EXTREMITY  . Heart attack ([place])   . Hypertension     Social History   Tobacco Use  . Smoking status: Former Smoker    Packs/day: 0.30    Years: 25.00    Pack years: 7.50    Quit date: 10/04/2003    Years since quitting:  15.3  . Smokeless tobacco: Never Used  Substance Use Topics  . Alcohol use: Not Currently    Alcohol/week: 0.0 standard drinks  . Drug use: No    Family History  Problem Relation Age of Onset  . COPD Mother   . Cancer Father   . Atrial fibrillation Father   . Atrial fibrillation Brother   . Cancer Maternal Grandmother        died at age 41  . Kidney disease Maternal Grandmother   . Cancer Maternal Grandfather   . Kidney disease Maternal Grandfather   . Stroke Paternal Grandfather    Allergies  Allergen Reactions  . Amiodarone Rash  . Amlodipine Rash    OBJECTIVE: Blood pressure (!) 150/87, pulse 73,  temperature 98 F (36.7 C), temperature source Oral, resp. rate 17, height 5\' 11"  (1.803 m), weight 122.3 kg, SpO2 98 %.  Physical Exam Constitutional:      Comments: He is resting quietly in bed watching television.  Cardiovascular:     Rate and Rhythm: Normal rate and regular rhythm.     Heart sounds: Murmur present.     Comments: 2/6 systolic murmur.  He has a healed median sternotomy incision. Musculoskeletal:     Comments: His left knee incision is healed.  There is no unusual redness or warmth.  Skin:    Comments: He has resolving cellulitis involving his left lower leg and foot.  He has some small hemorrhagic bullae and some open areas with serous drainage.  Psychiatric:        Mood and Affect: Mood normal.     Lab Results Lab Results  Component Value Date   WBC 9.6 01/29/2019   HGB 12.8 (L) 01/29/2019   HCT 37.6 (L) 01/29/2019   MCV 87.6 01/29/2019   PLT 217 01/29/2019    Lab Results  Component Value Date   CREATININE 1.34 (H) 01/29/2019   BUN 30 (H) 01/29/2019   NA 137 01/29/2019   K 3.1 (L) 01/29/2019   CL 106 01/29/2019   CO2 21 (L) 01/29/2019    Lab Results  Component Value Date   ALT 93 (H) 01/29/2019   AST 94 (H) 01/29/2019   ALKPHOS 70 01/29/2019   BILITOT 0.9 01/29/2019     Microbiology: Recent Results (from the past 240 hour(s))  Blood Culture (routine x 2)     Status: Abnormal   Collection Time: 01/25/19  7:28 PM   Specimen: BLOOD RIGHT FOREARM  Result Value Ref Range Status   Specimen Description BLOOD RIGHT FOREARM  Final   Special Requests   Final    BOTTLES DRAWN AEROBIC AND ANAEROBIC Blood Culture results may not be optimal due to an inadequate volume of blood received in culture bottles   Culture  Setup Time   Final    GRAM POSITIVE COCCI IN CHAINS IN BOTH AEROBIC AND ANAEROBIC BOTTLES CRITICAL RESULT CALLED TO, READ BACK BY AND VERIFIED WITH: Karsten Ro PHARMD, AT [id number] 01/26/19 BY Rush Landmark Performed at [facility], [place] [address], [place], [place] [address]    Culture STREPTOCOCCUS GROUP G (A)  Final   Report Status 01/28/2019 FINAL  Final   Organism ID, Bacteria STREPTOCOCCUS GROUP G  Final      Susceptibility   Streptococcus group g - MIC*    CLINDAMYCIN >=1 RESISTANT Resistant     AMPICILLIN <=0.25 SENSITIVE Sensitive     ERYTHROMYCIN >=8 RESISTANT Resistant     VANCOMYCIN 0.5 SENSITIVE Sensitive  CEFTRIAXONE <=0.12 SENSITIVE Sensitive     LEVOFLOXACIN 0.5 SENSITIVE Sensitive     PENICILLIN Value in next row Sensitive      SENSITIVE<=0.06    * STREPTOCOCCUS GROUP G  Blood Culture ID Panel (Reflexed)     Status: Abnormal   Collection Time: 01/25/19  7:28 PM  Result Value Ref Range Status   Enterococcus species NOT DETECTED NOT DETECTED Final   Listeria monocytogenes NOT DETECTED NOT DETECTED Final   Staphylococcus species NOT DETECTED NOT DETECTED Final   Staphylococcus aureus (BCID) NOT DETECTED NOT DETECTED Final   Streptococcus species DETECTED (A) NOT DETECTED Final    Comment: Not Enterococcus species, Streptococcus agalactiae, Streptococcus pyogenes, or Streptococcus pneumoniae. CRITICAL RESULT CALLED TO, READ BACK BY AND VERIFIED WITH: J. LEDFORD PHARMD AT 0704 01/26/19 BY D. VANHOOK    Streptococcus agalactiae NOT DETECTED NOT DETECTED Final   Streptococcus pneumoniae NOT DETECTED NOT DETECTED Final   Streptococcus pyogenes NOT DETECTED NOT DETECTED Final   Acinetobacter baumannii NOT DETECTED NOT DETECTED Final   Enterobacteriaceae species NOT DETECTED NOT DETECTED Final   Enterobacter cloacae complex NOT DETECTED NOT DETECTED Final   Escherichia coli NOT DETECTED NOT DETECTED Final   Klebsiella oxytoca NOT DETECTED NOT DETECTED Final   Klebsiella pneumoniae NOT DETECTED NOT DETECTED Final   Proteus species NOT DETECTED NOT DETECTED Final   Serratia marcescens NOT DETECTED NOT DETECTED Final   Haemophilus influenzae NOT DETECTED NOT DETECTED Final   Neisseria meningitidis NOT DETECTED  NOT DETECTED Final   Pseudomonas aeruginosa NOT DETECTED NOT DETECTED Final   Candida albicans NOT DETECTED NOT DETECTED Final   Candida glabrata NOT DETECTED NOT DETECTED Final   Candida krusei NOT DETECTED NOT DETECTED Final   Candida parapsilosis NOT DETECTED NOT DETECTED Final   Candida tropicalis NOT DETECTED NOT DETECTED Final    Comment: Performed at [facility], [place]. [address], [place], [place] [address]  Blood Culture (routine x 2)     Status: Abnormal   Collection Time: 01/25/19  7:30 PM   Specimen: BLOOD LEFT HAND  Result Value Ref Range Status   Specimen Description BLOOD LEFT HAND  Final   Special Requests   Final    BOTTLES DRAWN AEROBIC AND ANAEROBIC Blood Culture results may not be optimal due to an inadequate volume of blood received in culture bottles   Culture  Setup Time   Final    GRAM POSITIVE COCCI IN CHAINS IN BOTH AEROBIC AND ANAEROBIC BOTTLES CRITICAL VALUE NOTED.  VALUE IS CONSISTENT WITH PREVIOUSLY REPORTED AND CALLED VALUE.    Culture (A)  Final    STREPTOCOCCUS GROUP G SUSCEPTIBILITIES PERFORMED ON PREVIOUS CULTURE WITHIN THE LAST 5 DAYS. Performed at [facility], [place] [address], [place], [place] [address]    Report Status 01/28/2019 FINAL  Final  SARS Coronavirus 2 [facility] order, Performed in [facility] hospital lab) Nasopharyngeal Nasopharyngeal Swab     Status: None   Collection Time: 01/25/19  9:51 PM   Specimen: Nasopharyngeal Swab  Result Value Ref Range Status   SARS Coronavirus 2 NEGATIVE NEGATIVE Final    Comment: (NOTE) If result is NEGATIVE SARS-CoV-2 target nucleic acids are NOT DETECTED. The SARS-CoV-2 RNA is generally detectable in upper and lower  respiratory specimens during the acute phase of infection. The lowest  concentration of SARS-CoV-2 viral copies this assay can detect is 250  copies / mL. A negative result does not preclude SARS-CoV-2 infection  and should not be  used as the sole basis for treatment or  other  patient management decisions.  A negative result may occur with  improper specimen collection / handling, submission of specimen other  than nasopharyngeal swab, presence of viral mutation(s) within the  areas targeted by this assay, and inadequate number of viral copies  (<250 copies / mL). A negative result must be combined with clinical  observations, patient history, and epidemiological information. If result is POSITIVE SARS-CoV-2 target nucleic acids are DETECTED. The SARS-CoV-2 RNA is generally detectable in upper and lower  respiratory specimens dur ing the acute phase of infection.  Positive  results are indicative of active infection with SARS-CoV-2.  Clinical  correlation with patient history and other diagnostic information is  necessary to determine patient infection status.  Positive results do  not rule out bacterial infection or co-infection with other viruses. If result is PRESUMPTIVE POSTIVE SARS-CoV-2 nucleic acids MAY BE PRESENT.   A presumptive positive result was obtained on the submitted specimen  and confirmed on repeat testing.  While 2019 novel coronavirus  (SARS-CoV-2) nucleic acids may be present in the submitted sample  additional confirmatory testing may be necessary for epidemiological  and / or clinical management purposes  to differentiate between  SARS-CoV-2 and other Sarbecovirus currently known to infect humans.  If clinically indicated additional testing with an alternate test  methodology [id number]) is advised. The SARS-CoV-2 RNA is generally  detectable in upper and lower respiratory sp ecimens during the acute  phase of infection. The expected result is Negative. Fact Sheet for Patients:  StrictlyIdeas.no Fact Sheet for Healthcare Providers: BankingDealers.co.za This test is not yet approved or cleared by the [place] FDA and has been authorized for detection and/or diagnosis of  SARS-CoV-2 by FDA under an Emergency Use Authorization (EUA).  This EUA will remain in effect (meaning this test can be used) for the duration of the COVID-19 declaration under Section 564(b)(1) of the Act, 21 U.S.C. section 360bbb-3(b)(1), unless the authorization is terminated or revoked sooner. Performed at [facility], [place] [address] [place], [place] [address]   Urine culture     Status: None   Collection Time: 01/26/19  1:07 AM   Specimen: In/Out Cath Urine  Result Value Ref Range Status   Specimen Description IN/OUT CATH URINE  Final   Special Requests NONE  Final   Culture   Final    NO GROWTH Performed at [facility], [place] [address], [place], [place] [address]    Report Status 01/27/2019 FINAL  Final    Michel Bickers, MD [facility] [place] [phone] pager   [phone] cell 01/29/2019, 2:22 PM

## 2019-01-29 NOTE — Consult Note (Signed)
[facility] Nurse wound consult note Reason for Consult: requested to re-evaluate LLE wound  Wound type: partial thickness skin ulceration with unknown etiology; changes in the pretibial area and lateral calf.  Black/purple lesions that area intact extend aprox. 18cm x 16cm on the LLE  Pressure Injury POA: Yes/No/NA Measurement: see above; open wound 3cm x 3.5cm x 0.1cm Wound bed: partial thickness skin loss; pink moist; appears to have been bullous with roof of bulla still attached at the lateral aspect of the wound Drainage (amount, consistency, odor) serosanguinous  Periwound: mild edema; palpable distal pulses; intense erythema but has lessened since photo noted on 01/25/19, now the area has the black tissue.  Dressing procedure/placement/frequency: Continue xeroform gauze, no other topical recommendations for the new skin changes. Consider punch biopsy to have more exclusive dx for treatment plan. Discussed with primary team; left dressing off; they will see patient and obtain new images.   Discussed POC with patient and bedside nurse.  Re consult if needed, will not follow at this time. Thanks  Zed Wanninger [employer], RN,CWOCN, CNS, [place] [phone])

## 2019-01-29 NOTE — Progress Notes (Signed)
Subjective: Pt seen at the bedside this morning. Talking on the phone using bedside commode as a seat. Is concerned about the new black lesions on his wound. Denies pain at the sight, knee join pain or swelling. Has been using the walker to get around the room, and denies any pain with bearing weight on the knee.   Objective:  Vital signs in last 24 hours: Vitals:   01/28/19 1647 01/28/19 2029 01/29/19 0500 01/29/19 0535  BP: 132/72 132/73  (!) 151/79  Pulse: 63 63  62  Resp: 14 18    Temp: 98.2 F (36.8 C) 98.8 F (37.1 C)  98.3 F (36.8 C)  TempSrc: Oral Oral  Oral  SpO2: 93% 93%  96%  Weight:   122.3 kg   Height:      Physical Exam Vitals signs and nursing note reviewed.  Constitutional:      General: He is not in acute distress.    Comments: Using 3-in-1 bedside commode as a seat.  Cardiovascular:     Rate and Rhythm: Normal rate and regular rhythm.     Heart sounds: Murmur present. Systolic murmur present with a grade of 2/6.  Pulmonary:     Effort: Pulmonary effort is normal. No tachypnea or respiratory distress.     Breath sounds: Normal breath sounds. No decreased air movement.  Abdominal:     General: Abdomen is flat. Bowel sounds are normal.  Musculoskeletal:     Right lower leg: No edema.     Left lower leg: No edema.  Neurological:     Mental Status: He is alert.          Assessment/Plan: Mr. Kulczyk is a 68 year old M with significant PMH of bicuspid aortic valve s/p aortic valve replacement (2008), ascending aortic aneurysm s/p repair (2008), L knee replacement (2019), HTN, hyperlipidemia and OSA who presented with weakness and fevers and found to have LLE wound concerning for cellulitis with Group G strep bacteremia.   LLE cellulitis with ruptured bullous and subsequent healing scar Group G strep bacteremia Pt presented after rigors and malaise at home. In the ED, not hypotensive, non-tachycardic, leukocytosis with left shift, and temp of 104F in the  ED. No other source of infection identified - CXR negative and no other skin changes seen. Blood cultures from 8/27 grew Group G strep.  - antibiotics narrowed to ampicillin - will consult ID today for recommendations regarding his bacteremia with knee and valve replacements  - no growth on urine cultures  - wound care consulted - CBC and BMP in AM   AKI - improving Cr 1.79 on admission. UA reveals large Hgb with 21-50 RBCs, protein 100, ketones 5, rare bacteria. Rhabdomyolysis vs post-strep GN vs dehydration. CK downtrending 3600 > 1800 > 427. No signs of urinary retention. Improved with IV fluids - Cr 1.34 today - ASO not elevated, and anti-Dnase B pending   Hypokalemia - K 3.1 today - replete with potassium 15mEq PO BID   Aortic value and ascending aortic aneurysm repair (2008)   Pt with history of bicuspid valve and ascending aortic aneurysm s/p repair, with bovine valve. Echo from 2017 showed some diastolic dysfunction and normal functioning valve with EF 60-65%. - repeat echo on 8/30 showed EF [id number], RV systolic pressure mildly elevated, moderate stenosis of the aortic valve with higher gradient and worsened dimensionless index representing true deterioration of valve function - because pt is bacteremic, he will need TEE - cardiology consulted  Congestion Pt is a former smoker. On antibiotics, making a developing pneumonia unlikely. Lungs clear to ascultation. - duonebs q8h PRN - guaifenesin 600mg  PO BID - fluticasone 1 spray in each nare daily.   Hypertension - holding home amlodipine 10mg  daily and metoprolol 25mg  daily  Hyperlipidemia - continue home atorvastatin 20mg  daily.  Anxiety - continue home xanax 0.5mg  daily PRN and lexapro 20mg  daily   Diet - heart healthy  Fluids - none DVT ppx - enoxaparin 40mg  sub Q daily CODE STATUS - FULL CODE   Dispo: Anticipated discharge in approximately 2-3 day(s). PT recommending CIR.  Ladona Horns, MD 01/29/2019,  6:19 AM Pager: [phone]

## 2019-01-29 NOTE — Progress Notes (Signed)
Physical Therapy Treatment Patient Details Name: [name] [name] MRN: [id number] DOB: 1950/11/07 Today's Date: 01/29/2019    History of Present Illness Pt is a 69 y/o male admitted secondary to fever, chills, and LLE cellulitis. CT of head negative for acute abnormality. PMH includes HTN, s/p AVR, OSA, and aortic aneurysm s/p repair.     PT Comments    Pt pleasant with LLE undressed on arrival with telfa and gauze applied during session and removed end of session. Pt reports no pain at rest or with mobility this date with significantly improved ability to perform all transfers and progress to gait. Pt with decreased activity tolerance stating leg weakness and benefits from close chair follow to progress gait. Pt educated for HEP and progressive activity with potential for return home if pt able to increase ambulation distance and perform stairs. If pt continues to progress more slowly then CIR may still be required. Will continue to follow.     Follow Up Recommendations  CIR;Home health PT(pending progression)     Equipment Recommendations  Rolling walker with 5" wheels    Recommendations for Other Services       Precautions / Restrictions Precautions Precautions: Fall    Mobility  Bed Mobility Overal bed mobility: Modified Independent Bed Mobility: Supine to Sit     Supine to sit: HOB elevated     General bed mobility comments: HOB 20 degrees with use of rail and increased time with pt able to pivot to EOB without assist  Transfers Overall transfer level: Needs assistance   Transfers: Sit to/from Stand Sit to Stand: Min guard;From elevated surface         General transfer comment: pt initially stood from elevated bed with right knee blocked but required no physical assist. Pt then stood from recliner with cues for hand placement without assist with increased time to fully rise to RW  Ambulation/Gait Ambulation/Gait assistance: Min guard;+2 safety/equipment Gait  Distance (Feet): 40 Feet Assistive device: Rolling walker (2 wheeled) Gait Pattern/deviations: Step-through pattern;Decreased stride length   Gait velocity interpretation: 1.31 - 2.62 ft/sec, indicative of limited community ambulator General Gait Details: pt able to walk 40' then additional 20' after seated rest with use of RW and cues for posture and position   Stairs             Wheelchair Mobility    Modified Rankin (Stroke Patients Only)       Balance Overall balance assessment: Needs assistance Sitting-balance support: No upper extremity supported;Feet supported Sitting balance-Leahy Scale: Fair     Standing balance support: Bilateral upper extremity supported;During functional activity Standing balance-Leahy Scale: Poor Standing balance comment: reliant on bil UE support                            Cognition Arousal/Alertness: Awake/alert Behavior During Therapy: WFL for tasks assessed/performed Overall Cognitive Status: Within Functional Limits for tasks assessed                                        Exercises General Exercises - Lower Extremity Long Arc Quad: AROM;10 reps;Left;Seated Hip Flexion/Marching: AROM;Left;10 reps;Seated    General Comments        Pertinent Vitals/Pain Pain Assessment: No/denies pain    Home Living  Prior Function            PT Goals (current goals can now be found in the care plan section) Progress towards PT goals: Progressing toward goals    Frequency    Min 3X/week      PT Plan Discharge plan needs to be updated;Frequency needs to be updated    Co-evaluation              AM-PAC PT "6 Clicks" Mobility   Outcome Measure  Help needed turning from your back to your side while in a flat bed without using bedrails?: A Little Help needed moving from lying on your back to sitting on the side of a flat bed without using bedrails?: A Little Help  needed moving to and from a bed to a chair (including a wheelchair)?: A Little Help needed standing up from a chair using your arms (e.g., wheelchair or bedside chair)?: A Little Help needed to walk in hospital room?: A Little Help needed climbing 3-5 steps with a railing? : A Little 6 Click Score: 18    End of Session Equipment Utilized During Treatment: Gait belt Activity Tolerance: Patient tolerated treatment well Patient left: in chair;with call bell/phone within reach;with chair alarm set Nurse Communication: Mobility status PT Visit Diagnosis: Difficulty in walking, not elsewhere classified (R26.2);Muscle weakness (generalized) (M62.81);Pain     Time: [id number] PT Time Calculation (min) (ACUTE ONLY): 31 min  Charges:  $Gait Training: 8-22 mins $Therapeutic Activity: 8-22 mins                     Wandalene Abrams Pam Drown, PT Acute Rehabilitation Services Pager: [phone] Office: [place] 01/29/2019, 1:26 PM

## 2019-01-29 NOTE — Progress Notes (Signed)
Inpatient Rehab Admissions:  Inpatient Rehab Consult received.  I met with patient at the bedside for rehabilitation assessment and to discuss goals and expectations of an inpatient rehab admission.  At this time, he does not feel like he needs CIR level therapies given his progress with acute rehab team.  Will continue to follow 1-2 more days to determine whether pt still has functional criteria for a possible rehab admit.    Signed: Shann Medal, PT, DPT Admissions Coordinator [phone] 01/29/19  2:04 PM

## 2019-01-29 NOTE — Progress Notes (Signed)
    [facility] has been requested to perform a transesophageal echocardiogram on 01/30/19 for Bacteremia with Dr. Oval Linsey.  After careful review of history and examination, the risks and benefits of transesophageal echocardiogram have been explained including risks of esophageal damage, perforation (1:10,000 risk), bleeding, pharyngeal hematoma as well as other potential complications associated with conscious sedation including aspiration, arrhythmia, respiratory failure and death. Alternatives to treatment were discussed, questions were answered. Patient is willing to proceed. Labs and vital signs stable.   Reino Bellis, NP-C 01/29/2019 3:42 PM

## 2019-01-29 NOTE — Discharge Summary (Signed)
Name: [name] [name] MRN: [id number] DOB: September 01, 1950 68 y.o. PCP: Mittie Bodo  Date of Admission: 01/25/2019  6:38 PM Date of Discharge:  Attending Physician: No att. providers found  Discharge Diagnosis: 1. Group G Strep Bacteremia 2. Left Lower extremity cellulits   Discharge Medications: Allergies as of 02/01/2019      Reactions   Amiodarone Rash   Amlodipine Rash      Medication List    STOP taking these medications   doxycycline 100 MG capsule Commonly known as: MONODOX   metoprolol succinate 25 MG 24 hr tablet Commonly known as: TOPROL-XL   oxyCODONE 5 MG immediate release tablet Commonly known as: Oxy IR/ROXICODONE     TAKE these medications   ALPRAZolam 0.5 MG tablet Commonly known as: XANAX Take 0.5 mg by mouth daily as needed.   amLODipine 10 MG tablet Commonly known as: NORVASC Take 1 tablet (10 mg total) by mouth daily.   atorvastatin 20 MG tablet Commonly known as: LIPITOR Take 1 tablet (20 mg total) by mouth daily.   escitalopram 20 MG tablet Commonly known as: Lexapro Take 1 tablet (20 mg total) by mouth daily.   ibuprofen 200 MG tablet Commonly known as: ADVIL Take 400 mg by mouth every 6 (six) hours as needed for moderate pain.   penicillin G  IVPB Inject 24 Million Units into the vein daily. As a continuous infusion Indication:  Streptococcal endocarditis  Last Day of Therapy:  03/07/2019 Labs - Once weekly:  CBC/D and CMP, Labs - Every other week:  ESR and CRP   rifampin 300 MG capsule Commonly known as: Rifadin Take 1 capsule (300 mg total) by mouth 3 (three) times daily.   sildenafil 100 MG tablet Commonly known as: Viagra Take 0.5-1 tablets (50-100 mg total) by mouth daily as needed for erectile dysfunction.            Home Infusion Instuctions  (From admission, onward)         Start     Ordered   01/31/19 0000  Home infusion instructions [facility] May follow [place] Dosing Protocol; May  administer Cathflo as needed to maintain patency of vascular access device.; Flushing of vascular access device: per [facility] Protocol: 0.9% NaCl pre/post medica...    Question Answer Comment  Instructions May follow [place] Dosing Protocol   Instructions May administer Cathflo as needed to maintain patency of vascular access device.   Instructions Flushing of vascular access device: per [facility] Protocol: 0.9% NaCl pre/post medication administration and prn patency; Heparin 100 u/ml, 39m for implanted ports and Heparin 10u/ml, 573mfor all other central venous catheters.   Instructions May follow [facility] Anaphylaxis Protocol for First Dose Administration in the home: 0.9% NaCl at 25-50 ml/hr to maintain IV access for protocol meds. Epinephrine 0.3 ml IV/IM PRN and Benadryl 25-50 IV/IM PRN s/s of anaphylaxis.   Instructions [facility] Infusion Coordinator (RN) to assist per patient IV care needs in the home PRN.      01/31/19 1215          Disposition and follow-up:   [name]  At the hospital follow up visit please address:  1.    #Group G Strep Bacteremia and left leg cellulitis   -  PICC line and IV PCN and Rifampin for tentative 6 weeks, 10/7. Follow up before in clinic with ID for resolution. 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  - assess leg and any SE from antibiotics  - F/u repeat blood cultures from 8/30 , negative to date on discharge , 4 days growth   #AKI  Cr 1.79 , Improved with IV fluids. Cr 1.12 on discharge - BMP  #HTN Held patient's medications initially in setting of bacteremia. Restarted amlodipine before discharge. -Consider restarting metoprolol 55m   2.  Labs / imaging needed at time of follow-up: CBC, BMP . TEE in 6 weeks for follow up of aortic valve vegetation.  3.  Pending labs/ test needing follow-up: Blood cultures  from 8/30, NGTD on day 4  Follow-up Appointments: Follow-up Information    Weber, SDamaris Hippo PA-C. Go on 02/08/2019.   Specialty: Physician Assistant Why: _0 :00pm Contact information: [phone]        PJanine Ores MD Follow up on 02/27/2019.   Specialty: Infectious Diseases Why: [facility] at 9:30 AM. Please arrive to clinic ~15 minutes prior to appointment in order to complete new patient paperwork. Contact information: [address] [place] [phone]        [facility] Follow up.   Why: HHRN for iv abx Contact information: [phone]      Call ORexene Alberts MD.   Specialty: Cardiothoracic Surgery Why: Please call Dr.Owen's office and scheule an appointment in the next 1-2 weeks. Contact information: [address] [place] [phone]           Hospital Course by problem list: Pt presented with confusion, fever, and weakness for a week prior. On EMS arrival, he was noted to have a large LLE wound. In the ED, pt was febrile to 104F, WBC 14.3 with left shift, and elevated lactate to 2.3.  Group G Strep bacteremia Blood cultures from 8/27 grew group G strep.  Repeat blood cultures 8/30 NGTD at day 4. TEE on 9/1 showed small mobile  density on bioprosthetic aortic valve.  Patient was on broad spectrum antibiotics for 4 days. Narrowed to ampicillin and Rifampin. Ampicillin switched to IV PCN before discharge for ease of dosing at home and isolates sensitive to PCN. 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 No surgical intervention at this time. Repete echo after antibiotic course. PICC line placed and home health has been arranged. DC on 9/3  Left lower extremity cellulitis: Patient came in with left leg cellulitis, likely  source of bacteremia. Patient can not provide story of how cellulitis began, but says he suddenly noticed . Improved with abx. Pictures below of initial presentation.      AKI Admission creatinine 1.79, due to dehydration versus rhabdomyolysis. CK down trended,Improved with IV fluids. Creatinine 1.09 on9/2.  Congestion Reports non productive cough in the mornings. On antibiotics, making pneumonia unlikely. Patient former smoker, on CPAP at night. Instructed on how to use incentive spirometer. Clear to auscultation on exam. Treated with incentive spirometer 10 times per hour, duonebs q8 PR, and  Fluticasone 1 spray in each nare daily  Hypertension: Systolic 1951-884 Home meds being held, amlodipine 10 mg and metoprolol 25 mg initially. Restarted amlodipine 165mand held metoprolol.  Hyperlipidemia: Continue home atorvastatin 20 mg daily  Anxiety: Continue home Xanax 0.5 mg daily as needed  and Lexapro 20 mg daily  Discharge Vitals:   BP (!) 150/92 (BP Location: Right Arm)    Pulse 69    Temp 98.7 F (37.1 C) (Oral)    Resp 20    Ht 5' 11" (1.803 m)    Wt 118.9 kg    SpO2 97%    BMI 36.56 kg/m   Pertinent Labs, Studies, and Procedures:  CBC Latest Ref Rng & Units 02/01/2019 01/31/2019 01/30/2019  WBC 4.0 - 10.5 K/uL 11.5(H) 12.1(H) 10.3  Hemoglobin 13.0 - 17.0 g/dL 12.2(L) 12.5(L) 12.7(L)  Hematocrit 39.0 - 52.0 % 37.2(L) 37.5(L) 38.5(L)  Platelets 150 - 400 K/uL 403(H) 366 271   CMP Latest Ref Rng & Units 02/01/2019 01/31/2019 01/30/2019  Glucose 70 - 99 mg/dL 125(H) 112(H) 119(H)  BUN 8 - 23 mg/dL 14 19 25(H)  Creatinine 0.61 - 1.24 mg/dL 1.12 1.09 1.18  Sodium 135 - 145 mmol/L 137 139 138  Potassium 3.5 - 5.1 mmol/L 3.3(L) 3.5 3.2(L)  Chloride 98 - 111 mmol/L 103 107 108  CO2 22 - 32 mmol/L 24 21(L) 20(L)  Calcium 8.9 - 10.3 mg/dL 7.9(L) 8.1(L) 8.0(L)  Total Protein 6.5 - 8.1 g/dL - 5.7(L) 5.6(L)  Total Bilirubin 0.3 - 1.2 mg/dL - 1.1 1.0  Alkaline Phos 38 - 126 U/L - 64 66   AST 15 - 41 U/L - 75(H) 78(H)  ALT 0 - 44 U/L - 106(H) 94(H)   Urinalysis    Component Value Date/Time   COLORURINE AMBER (A) 01/26/2019 0107   APPEARANCEUR CLOUDY (A) 01/26/2019 0107   LABSPEC 1.015 01/26/2019 0107   PHURINE 5.0 01/26/2019 0107   GLUCOSEU NEGATIVE 01/26/2019 0107   HGBUR LARGE (A) 01/26/2019 0107   BILIRUBINUR NEGATIVE 01/26/2019 0107   BILIRUBINUR neg 11/03/2011 1121   KETONESUR 5 (A) 01/26/2019 0107   PROTEINUR 100 (A) 01/26/2019 0107   UROBILINOGEN 0.2 11/03/2011 1121   UROBILINOGEN 0.2 04/17/2007 1054   NITRITE NEGATIVE 01/26/2019 0107   LEUKOCYTESUR NEGATIVE 01/26/2019 0107   Recent Results (from the past 240 hour(s))  Blood Culture (routine x 2)     Status: Abnormal   Collection Time: 01/25/19  7:28 PM   Specimen: BLOOD RIGHT FOREARM  Result Value Ref Range Status   Specimen Description BLOOD RIGHT FOREARM  Final   Special Requests   Final    BOTTLES DRAWN AEROBIC AND ANAEROBIC Blood Culture results may not be optimal due to an inadequate volume of blood received in culture bottles   Culture  Setup Time   Final    GRAM POSITIVE COCCI IN CHAINS IN BOTH AEROBIC AND ANAEROBIC BOTTLES CRITICAL RESULT CALLED TO, READ BACK BY AND VERIFIED WITH: Karsten Ro PHARMD, AT 2263 01/26/19 BY Rush Landmark Performed at [facility], [place] [address], [place], [place] [address]    Culture STREPTOCOCCUS GROUP G (A)  Final   Report Status 01/28/2019 FINAL  Final   Organism ID, Bacteria STREPTOCOCCUS GROUP G  Final      Susceptibility   Streptococcus group g - MIC*    CLINDAMYCIN >=1 RESISTANT Resistant     AMPICILLIN <=0.25 SENSITIVE Sensitive     ERYTHROMYCIN >=8 RESISTANT Resistant     VANCOMYCIN 0.5 SENSITIVE Sensitive     CEFTRIAXONE <=0.12 SENSITIVE Sensitive     LEVOFLOXACIN 0.5 SENSITIVE Sensitive     PENICILLIN Value in next row Sensitive      SENSITIVE<=0.06    * STREPTOCOCCUS GROUP G  Blood Culture ID Panel (Reflexed)  Status: Abnormal    Collection Time: 01/25/19  7:28 PM  Result Value Ref Range Status   Enterococcus species NOT DETECTED NOT DETECTED Final   Listeria monocytogenes NOT DETECTED NOT DETECTED Final   Staphylococcus species NOT DETECTED NOT DETECTED Final   Staphylococcus aureus (BCID) NOT DETECTED NOT DETECTED Final   Streptococcus species DETECTED (A) NOT DETECTED Final    Comment: Not Enterococcus species, Streptococcus agalactiae, Streptococcus pyogenes, or Streptococcus pneumoniae. CRITICAL RESULT CALLED TO, READ BACK BY AND VERIFIED WITH: J. LEDFORD PHARMD AT 0704 01/26/19 BY D. VANHOOK    Streptococcus agalactiae NOT DETECTED NOT DETECTED Final   Streptococcus pneumoniae NOT DETECTED NOT DETECTED Final   Streptococcus pyogenes NOT DETECTED NOT DETECTED Final   Acinetobacter baumannii NOT DETECTED NOT DETECTED Final   Enterobacteriaceae species NOT DETECTED NOT DETECTED Final   Enterobacter cloacae complex NOT DETECTED NOT DETECTED Final   Escherichia coli NOT DETECTED NOT DETECTED Final   Klebsiella oxytoca NOT DETECTED NOT DETECTED Final   Klebsiella pneumoniae NOT DETECTED NOT DETECTED Final   Proteus species NOT DETECTED NOT DETECTED Final   Serratia marcescens NOT DETECTED NOT DETECTED Final   Haemophilus influenzae NOT DETECTED NOT DETECTED Final   Neisseria meningitidis NOT DETECTED NOT DETECTED Final   Pseudomonas aeruginosa NOT DETECTED NOT DETECTED Final   Candida albicans NOT DETECTED NOT DETECTED Final   Candida glabrata NOT DETECTED NOT DETECTED Final   Candida krusei NOT DETECTED NOT DETECTED Final   Candida parapsilosis NOT DETECTED NOT DETECTED Final   Candida tropicalis NOT DETECTED NOT DETECTED Final    Comment: Performed at [facility], [place]. [address] [place], [place] [address]  Blood Culture (routine x 2)     Status: Abnormal   Collection Time: 01/25/19  7:30 PM   Specimen: BLOOD LEFT HAND  Result Value Ref Range Status   Specimen Description BLOOD LEFT HAND  Final    Special Requests   Final    BOTTLES DRAWN AEROBIC AND ANAEROBIC Blood Culture results may not be optimal due to an inadequate volume of blood received in culture bottles   Culture  Setup Time   Final    GRAM POSITIVE COCCI IN CHAINS IN BOTH AEROBIC AND ANAEROBIC BOTTLES CRITICAL VALUE NOTED.  VALUE IS CONSISTENT WITH PREVIOUSLY REPORTED AND CALLED VALUE.    Culture (A)  Final    STREPTOCOCCUS GROUP G SUSCEPTIBILITIES PERFORMED ON PREVIOUS CULTURE WITHIN THE LAST 5 DAYS. Performed at [facility], [place] [address], [place], [place] [address]    Report Status 01/28/2019 FINAL  Final  SARS Coronavirus 2 [facility] order, Performed in [facility] hospital lab) Nasopharyngeal Nasopharyngeal Swab     Status: None   Collection Time: 01/25/19  9:51 PM   Specimen: Nasopharyngeal Swab  Result Value Ref Range Status   SARS Coronavirus 2 NEGATIVE NEGATIVE Final    Comment: (NOTE) If result is NEGATIVE SARS-CoV-2 target nucleic acids are NOT DETECTED. The SARS-CoV-2 RNA is generally detectable in upper and lower  respiratory specimens during the acute phase of infection. The lowest  concentration of SARS-CoV-2 viral copies this assay can detect is 250  copies / mL. A negative result does not preclude SARS-CoV-2 infection  and should not be used as the sole basis for treatment or other  patient management decisions.  A negative result may occur with  improper specimen collection / handling, submission of specimen other  than nasopharyngeal swab, presence of viral mutation(s) within the  areas targeted by  this assay, and inadequate number of viral copies  (<250 copies / mL). A negative result must be combined with clinical  observations, patient history, and epidemiological information. If result is POSITIVE SARS-CoV-2 target nucleic acids are DETECTED. The SARS-CoV-2 RNA is generally detectable in upper and lower  respiratory specimens dur ing the acute phase of infection.  Positive   results are indicative of active infection with SARS-CoV-2.  Clinical  correlation with patient history and other diagnostic information is  necessary to determine patient infection status.  Positive results do  not rule out bacterial infection or co-infection with other viruses. If result is PRESUMPTIVE POSTIVE SARS-CoV-2 nucleic acids MAY BE PRESENT.   A presumptive positive result was obtained on the submitted specimen  and confirmed on repeat testing.  While 2019 novel coronavirus  (SARS-CoV-2) nucleic acids may be present in the submitted sample  additional confirmatory testing may be necessary for epidemiological  and / or clinical management purposes  to differentiate between  SARS-CoV-2 and other Sarbecovirus currently known to infect humans.  If clinically indicated additional testing with an alternate test  methodology [phone]) is advised. The SARS-CoV-2 RNA is generally  detectable in upper and lower respiratory sp ecimens during the acute  phase of infection. The expected result is Negative. Fact Sheet for Patients:  StrictlyIdeas.no Fact Sheet for Healthcare Providers: BankingDealers.co.za This test is not yet approved or cleared by the [place] FDA and has been authorized for detection and/or diagnosis of SARS-CoV-2 by FDA under an Emergency Use Authorization (EUA).  This EUA will remain in effect (meaning this test can be used) for the duration of the COVID-19 declaration under Section 564(b)(1) of the Act, 21 U.S.C. section 360bbb-3(b)(1), unless the authorization is terminated or revoked sooner. Performed at [facility], [place] [address] [place], [place] [address]   Urine culture     Status: None   Collection Time: 01/26/19  1:07 AM   Specimen: In/Out Cath Urine  Result Value Ref Range Status   Specimen Description IN/OUT CATH URINE  Final   Special Requests NONE  Final   Culture   Final    NO  GROWTH Performed at [facility], [place] [address], [place], [place] [address]    Report Status 01/27/2019 FINAL  Final  Culture, blood (routine x 2)     Status: None (Preliminary result)   Collection Time: 01/28/19 10:58 AM   Specimen: BLOOD RIGHT HAND  Result Value Ref Range Status   Specimen Description BLOOD RIGHT HAND  Final   Special Requests AEROBIC BOTTLE ONLY Blood Culture adequate volume  Final   Culture   Final    NO GROWTH 4 DAYS Performed at [facility], [place] [address] [place], [place] [address]    Report Status PENDING  Incomplete  Culture, blood (routine x 2)     Status: None (Preliminary result)   Collection Time: 01/28/19 11:04 AM   Specimen: BLOOD LEFT HAND  Result Value Ref Range Status   Specimen Description BLOOD LEFT HAND  Final   Special Requests AEROBIC BOTTLE ONLY Blood Culture adequate volume  Final   Culture   Final    NO GROWTH 4 DAYS Performed at [facility], [place] [address], [place], [place] [address]    Report Status PENDING  Incomplete   CXR 01/25/2019 CLINICAL DATA:  Fever and altered mental status  EXAM: PORTABLE CHEST 1 VIEW  COMPARISON:  06/23/2016  FINDINGS: The heart size and  mediastinal contours are within normal limits. Both lungs are clear. The visualized skeletal structures are unremarkable.  IMPRESSION: No active disease.  X-ray LEFT Tibia/Fibula 01/25/2019 CLINICAL DATA:  Leg cellulitis  EXAM: LEFT TIBIA AND FIBULA - 2 VIEW  COMPARISON:  None.  FINDINGS: Status post knee replacement. No fracture or dislocation. No periostitis. Extensive soft tissue edema without soft tissue emphysema. Soft tissue fullness in the suprapatellar region may reflect effusion  IMPRESSION: Knee replacement without acute osseous abnormality. Extensive soft tissue edema without radiopaque foreign body or soft tissue emphysema  CT Head without contrast 01/25/2019 CLINICAL DATA:  Altered level of  consciousness  EXAM: CT HEAD WITHOUT CONTRAST  TECHNIQUE: Contiguous axial images were obtained from the base of the skull through the vertex without intravenous contrast.  COMPARISON:  None.  FINDINGS: Brain: No acute intracranial abnormality. Specifically, no hemorrhage, hydrocephalus, mass lesion, acute infarction, or significant intracranial injury.  Vascular: No hyperdense vessel or unexpected calcification.  Skull: No acute calvarial abnormality.  Sinuses/Orbits: No acute finding  Other: None  IMPRESSION: No acute intracranial abnormality.  CXR 01/27/2019 CLINICAL DATA:  Tachypnea  EXAM: CHEST  1 VIEW  COMPARISON:  January 25, 2019  FINDINGS: There is a new retrocardiac airspace opacity and opacity at the left lung base. There is new elevation of the left hemidiaphragm. There is no pneumothorax. No large pleural effusion. The patient is status post prior median sternotomy. The heart size remains enlarged.  IMPRESSION: New left lower lobe airspace opacity concerning for pneumonia or aspiration in the appropriate clinical setting.  VAS [place] LE Venous   Summary: Right: There is no evidence of a common femoral vein obstruction. Left: There is no evidence of deep vein thrombosis in the lower extremity. However, portions of this examination were limited- see technologist comments above. No cystic structure found in the popliteal fossa.   Discharge Instructions: Discharge Instructions    Call MD for:  difficulty breathing, headache or visual disturbances   Complete by: As directed    Call MD for:  extreme fatigue   Complete by: As directed    Call MD for:  hives   Complete by: As directed    Call MD for:  persistant dizziness or light-headedness   Complete by: As directed    Call MD for:  persistant dizziness or light-headedness   Complete by: As directed    Call MD for:  persistant nausea and vomiting   Complete by: As directed    Call MD  for:  redness, tenderness, or signs of infection (pain, swelling, redness, odor or green/yellow discharge around incision site)   Complete by: As directed    Call MD for:  redness, tenderness, or signs of infection (pain, swelling, redness, odor or green/yellow discharge around incision site)   Complete by: As directed    Call MD for:  severe uncontrolled pain   Complete by: As directed    Call MD for:  temperature >100.4   Complete by: As directed    Call MD for:  temperature >100.4   Complete by: As directed    Diet - low sodium heart healthy   Complete by: As directed    Home infusion instructions [facility] May follow [place] Dosing Protocol; May administer Cathflo as needed to maintain patency of vascular access device.; Flushing of vascular access device: per [facility] Protocol: 0.9% NaCl pre/post medica...   Complete by: As directed    Instructions: May follow [place] Dosing Protocol   Instructions:  May administer Cathflo as needed to maintain patency of vascular access device.   Instructions: Flushing of vascular access device: per [facility] Protocol: 0.9% NaCl pre/post medication administration and prn patency; Heparin 100 u/ml, 46m for implanted ports and Heparin 10u/ml, 583mfor all other central venous catheters.   Instructions: May follow [facility] Anaphylaxis Protocol for First Dose Administration in the home: 0.9% NaCl at 25-50 ml/hr to maintain IV access for protocol meds. Epinephrine 0.3 ml IV/IM PRN and Benadryl 25-50 IV/IM PRN s/s of anaphylaxis.   Instructions: [facility] Coordinator (RN) to assist per patient IV care needs in the home PRN.   Increase activity slowly   Complete by: As directed    Increase activity slowly   Complete by: As directed       Signed: JeTamsen SniderMD PGY1  [phone]

## 2019-01-29 NOTE — Plan of Care (Signed)

## 2019-01-30 ENCOUNTER — Inpatient Hospital Stay (HOSPITAL_COMMUNITY): Payer: BC Managed Care – PPO

## 2019-01-30 ENCOUNTER — Encounter (HOSPITAL_COMMUNITY): Payer: Self-pay | Admitting: *Deleted

## 2019-01-30 ENCOUNTER — Encounter: Payer: Self-pay | Admitting: Thoracic Surgery (Cardiothoracic Vascular Surgery)

## 2019-01-30 ENCOUNTER — Encounter (HOSPITAL_COMMUNITY)
Admission: EM | Disposition: A | Discharge status: Home Health | Payer: Self-pay | Source: Home / Self Care | Attending: Internal Medicine

## 2019-01-30 ENCOUNTER — Ambulatory Visit (HOSPITAL_COMMUNITY)
Admission: RE | Admit: 2019-01-30 | Payer: BC Managed Care – PPO | Source: Ambulatory Visit | Admitting: Cardiovascular Disease

## 2019-01-30 DIAGNOSIS — T826XXA Infection and inflammatory reaction due to cardiac valve prosthesis, initial encounter: Secondary | ICD-10-CM | POA: Insufficient documentation

## 2019-01-30 DIAGNOSIS — I350 Nonrheumatic aortic (valve) stenosis: Secondary | ICD-10-CM

## 2019-01-30 DIAGNOSIS — Z9989 Dependence on other enabling machines and devices: Secondary | ICD-10-CM

## 2019-01-30 DIAGNOSIS — Z8774 Personal history of (corrected) congenital malformations of heart and circulatory system: Secondary | ICD-10-CM

## 2019-01-30 DIAGNOSIS — I38 Endocarditis, valve unspecified: Secondary | ICD-10-CM | POA: Insufficient documentation

## 2019-01-30 DIAGNOSIS — I339 Acute and subacute endocarditis, unspecified: Secondary | ICD-10-CM

## 2019-01-30 DIAGNOSIS — E785 Hyperlipidemia, unspecified: Secondary | ICD-10-CM

## 2019-01-30 DIAGNOSIS — A419 Sepsis, unspecified organism: Secondary | ICD-10-CM

## 2019-01-30 DIAGNOSIS — Z9889 Other specified postprocedural states: Secondary | ICD-10-CM

## 2019-01-30 DIAGNOSIS — L03116 Cellulitis of left lower limb: Secondary | ICD-10-CM

## 2019-01-30 DIAGNOSIS — E876 Hypokalemia: Secondary | ICD-10-CM

## 2019-01-30 DIAGNOSIS — N179 Acute kidney failure, unspecified: Secondary | ICD-10-CM

## 2019-01-30 DIAGNOSIS — I10 Essential (primary) hypertension: Secondary | ICD-10-CM

## 2019-01-30 DIAGNOSIS — R7881 Bacteremia: Secondary | ICD-10-CM

## 2019-01-30 DIAGNOSIS — Z79899 Other long term (current) drug therapy: Secondary | ICD-10-CM

## 2019-01-30 DIAGNOSIS — G4733 Obstructive sleep apnea (adult) (pediatric): Secondary | ICD-10-CM

## 2019-01-30 DIAGNOSIS — F419 Anxiety disorder, unspecified: Secondary | ICD-10-CM

## 2019-01-30 HISTORY — PX: TEE WITHOUT CARDIOVERSION: SHX5443

## 2019-01-30 LAB — COMPREHENSIVE METABOLIC PANEL
ALT: 94 U/L — ABNORMAL HIGH (ref 0–44)
AST: 78 U/L — ABNORMAL HIGH (ref 15–41)
Albumin: 2.3 g/dL — ABNORMAL LOW (ref 3.5–5.0)
Alkaline Phosphatase: 66 U/L (ref 38–126)
Anion gap: 10 (ref 5–15)
BUN: 25 mg/dL — ABNORMAL HIGH (ref 8–23)
CO2: 20 mmol/L — ABNORMAL LOW (ref 22–32)
Calcium: 8 mg/dL — ABNORMAL LOW (ref 8.9–10.3)
Chloride: 108 mmol/L (ref 98–111)
Creatinine, Ser: 1.18 mg/dL (ref 0.61–1.24)
GFR calc Af Amer: >60 mL/min (ref >60)
GFR calc non Af Amer: >60 mL/min (ref >60)
Glucose, Bld: 119 mg/dL — ABNORMAL HIGH (ref 70–99)
Potassium: 3.2 mmol/L — ABNORMAL LOW (ref 3.5–5.1)
Sodium: 138 mmol/L (ref 135–145)
Total Bilirubin: 1 mg/dL (ref 0.3–1.2)
Total Protein: 5.6 g/dL — ABNORMAL LOW (ref 6.5–8.1)

## 2019-01-30 LAB — CBC
HCT: 38.5 % — ABNORMAL LOW (ref 39.0–52.0)
Hemoglobin: 12.7 g/dL — ABNORMAL LOW (ref 13.0–17.0)
MCH: 30 pg (ref 26.0–34.0)
MCHC: 33 g/dL (ref 30.0–36.0)
MCV: 90.8 fL (ref 80.0–100.0)
Platelets: 271 10*3/uL (ref 150–400)
RBC: 4.24 MIL/uL (ref 4.22–5.81)
RDW: 14.5 % (ref 11.5–15.5)
WBC: 10.3 10*3/uL (ref 4.0–10.5)
nRBC: 0 % (ref 0.0–0.2)

## 2019-01-30 LAB — ANTI-DNASE B ANTIBODY: Anti-DNAse-B: <78 U/mL (ref 0–120)

## 2019-01-30 SURGERY — ECHOCARDIOGRAM, TRANSESOPHAGEAL
Anesthesia: Moderate Sedation

## 2019-01-30 MED ORDER — POTASSIUM CHLORIDE CRYS ER 20 MEQ PO TBCR
40.0000 meq | EXTENDED_RELEASE_TABLET | Freq: Two times a day (BID) | ORAL | Status: AC
  Administered 2019-01-30 (×2): 40 meq via ORAL
  Filled 2019-01-30 (×2): qty 2

## 2019-01-30 MED ORDER — RIFAMPIN 300 MG PO CAPS
300.0000 mg | ORAL_CAPSULE | Freq: Two times a day (BID) | ORAL | Status: DC
  Administered 2019-01-30: 300 mg via ORAL
  Filled 2019-01-30 (×2): qty 1

## 2019-01-30 MED ORDER — SODIUM CHLORIDE 0.9 % IV SOLN
INTRAVENOUS | Status: DC | PRN
  Administered 2019-01-30: 250 mL via INTRAVENOUS

## 2019-01-30 MED ORDER — MIDAZOLAM HCL (PF) 10 MG/2ML IJ SOLN
INTRAMUSCULAR | Status: DC | PRN
  Administered 2019-01-30: 2 mg via INTRAVENOUS
  Administered 2019-01-30 (×2): 1 mg via INTRAVENOUS
  Administered 2019-01-30: 2 mg via INTRAVENOUS

## 2019-01-30 MED ORDER — DIPHENHYDRAMINE HCL 50 MG/ML IJ SOLN
INTRAMUSCULAR | Status: DC | PRN
  Administered 2019-01-30: 25 mg via INTRAVENOUS

## 2019-01-30 MED ORDER — FENTANYL CITRATE (PF) 100 MCG/2ML IJ SOLN
INTRAMUSCULAR | Status: DC | PRN
  Administered 2019-01-30 (×4): 25 ug via INTRAVENOUS

## 2019-01-30 MED ORDER — BUTAMBEN-TETRACAINE-BENZOCAINE 2-2-14 % EX AERO
INHALATION_SPRAY | CUTANEOUS | Status: DC | PRN
  Administered 2019-01-30: 2 via TOPICAL

## 2019-01-30 MED ORDER — ENOXAPARIN SODIUM 60 MG/0.6ML ~~LOC~~ SOLN
60.0000 mg | Freq: Every day | SUBCUTANEOUS | Status: DC
  Administered 2019-01-31 – 2019-02-01 (×2): 60 mg via SUBCUTANEOUS
  Filled 2019-01-30 (×2): qty 0.6

## 2019-01-30 NOTE — Progress Notes (Signed)
OT Cancellation Note  Patient Details Name: [name] [name] MRN: [id number] DOB: 1950-08-17   Cancelled Treatment:    Reason Eval/Treat Not Completed: Patient at procedure or test/ unavailable, off unit at TEE.  Will follow and see as able this afternoon.   Delight Stare, OT Acute Rehabilitation Services Pager [phone] Office [phone]   Delight Stare 01/30/2019, 8:38 AM

## 2019-01-30 NOTE — Consult Note (Addendum)
[name] [name]       [place], [id number]             [phone]        Prestyn A Eberle Burna Medical Record [id number] Date of Birth: 01-18-1951  Referring: Oval Linsey Primary Care: Gale Journey Damaris Hippo, PA-C Primary Cardiologist:Henry Carlye Grippe, MD  Chief Complaint:    Chief Complaint  Patient presents with  . Blood Infection   History of Present Illness:      Mr. Alavi is a 68 yo white male with known history of AVR, Ascending aortic replacement, MAZE procedure by Dr. [name] in 2008, HTN, Hyperlipidemia, S/P Left Knee Replacement 10/19. and OSA on CPAP.  The patient had been in his usual state of health until about 1 week ago.  At that time he developed an area of redness along his left leg.  He states this was not painful but progressed to multiple lesions that were oozing pus.  The patients wife contacted EMS as he developed altered mental status, rigors, chills, diaphoresis, malaise and fever of approximately 3 days in duration.  She said the patient was unable to get up and out of bed to use the bathroom due to pain in his left lower extremity and his urine as being very dark.  The patient denied chest pain, N/V, and shortness of breath. Upon arrival to the ED the patient was noted to febrile at 104.  Xray of his left leg did not show evidence of gas in the soft tissues.  Covid 19 testing was negative.  Due to AMS CT scan of the head was obtained and showed no evidence of acute intracranial abnormality.  It was felt his left leg was the source of his fever and he was admitted for further care and he was started on antibiotics.  Lab results showed + bacteremia, mild elevation in creatinine to 1.79 and elevated lactate level.  The patient underwent TEE which showed evidence of a small 0.42 cm x 0.55 cm mobile vegetation on the right coronary leaflet concerning for endocarditis.  CT surgery consult has been requested.  Currently patient is without complaints.  He states that  his left leg is improved since he came in.  He is nervous about everything going on.  He last saw a Dentist last fall.      Current Activity/ Functional Status: Patient is independent with mobility/ambulation, transfers, ADL's, IADL's.   Zubrod Score: At the time of surgery this patient's most appropriate activity status/level should be described as: []     0    Normal activity, no symptoms [x]     1    Restricted in physical strenuous activity but ambulatory, able to do out light work []     2    Ambulatory and capable of self care, unable to do work activities, up and about                 more than 50%  Of the time                            []     3    Only limited self care, in bed greater than 50% of waking hours []     4    Completely disabled, no self care, confined to bed or chair []     5    Moribund  Past Medical History:  Diagnosis Date  .  Bicuspid aortic valve    Bentall procedure 2008  . Cellulitis 12/2018   LEFT LOWER EXTREMITY  . Heart attack ([place])   . Hypertension   . S/P aortic valve replacement with bioprosthetic valve 04/19/2007   25 mm Edwards Magna bovine pericardial tissue valve  . S/P ascending aortic aneurysm repair 04/19/2007   supracoronary straight graft  . S/P Maze operation for atrial fibrillation 04/19/2007    Past Surgical History:  Procedure Laterality Date  . CARDIAC CATHETERIZATION    . CARDIAC VALVE REPLACEMENT     2008  . CORONARY ARTERY BYPASS GRAFT  2007  . TONSILLECTOMY     age 30  . TOTAL KNEE ARTHROPLASTY Left 03/20/2018   Procedure: LEFT TOTAL KNEE ARTHROPLASTY;  Surgeon: Gaynelle Arabian, MD;  Location: [facility];  Service: Orthopedics;  Laterality: Left;  Marland Kitchen VASECTOMY      Social History   Tobacco Use  Smoking Status Former Smoker  . Packs/day: 0.30  . Years: 25.00  . Pack years: 7.50  . Quit date: 10/04/2003  . Years since quitting: 15.3  Smokeless Tobacco Never Used    Social History   Substance and Sexual Activity  Alcohol Use  Not Currently  . Alcohol/week: 0.0 standard drinks     Allergies  Allergen Reactions  . Amiodarone Rash  . Amlodipine Rash    Current Facility-Administered Medications  Medication Dose Route Frequency Provider Last Rate Last Dose  . acetaminophen (TYLENOL) tablet 650 mg  650 mg Oral Q6H PRN Chundi, Vahini, MD   650 mg at 01/30/19 1529   Or  . acetaminophen (TYLENOL) suppository 650 mg  650 mg Rectal Q6H PRN Chundi, Vahini, MD      . ALPRAZolam Duanne Moron) tablet 0.5 mg  0.5 mg Oral Daily PRN Chundi, Vahini, MD   0.5 mg at 01/28/19 2128  . ampicillin (OMNIPEN) 2 g in sodium chloride 0.9 % 100 mL IVPB  2 g Intravenous Q4H Katherine Roan, MD 300 mL/hr at 01/30/19 1536 2 g at 01/30/19 1536  . atorvastatin (LIPITOR) tablet 20 mg  20 mg Oral Daily Chundi, Vahini, MD   20 mg at 01/30/19 1110  . diclofenac sodium (VOLTAREN) 1 % transdermal gel 2 g  2 g Topical QID Ladona Horns, MD   2 g at 01/30/19 1507  . [START ON 01/31/2019] enoxaparin (LOVENOX) injection 60 mg  60 mg Subcutaneous Daily Gilles Chiquito B, MD      . escitalopram (LEXAPRO) tablet 20 mg  20 mg Oral Daily Chundi, Vahini, MD   20 mg at 01/30/19 1110  . fluticasone (FLONASE) 50 MCG/ACT nasal spray 1 spray  1 spray Each Nare Daily Katherine Roan, MD   1 spray at 01/30/19 1111  . ipratropium-albuterol (DUONEB) 0.5-2.5 (3) MG/3ML nebulizer solution 3 mL  3 mL Nebulization Q8H PRN Katherine Roan, MD   3 mL at 01/29/19 0620  . oxyCODONE-acetaminophen (PERCOCET/ROXICET) 5-325 MG per tablet 1 tablet  1 tablet Oral BID PRN Ladona Horns, MD   1 tablet at 01/28/19 [phone]  . potassium chloride SA (K-DUR) CR tablet 40 mEq  40 mEq Oral BID Madalyn Rob, MD   40 mEq at 01/30/19 1111  . promethazine (PHENERGAN) tablet 12.5 mg  12.5 mg Oral Q6H PRN Chundi, Vahini, MD      . senna-docusate (Senokot-S) tablet 1 tablet  1 tablet Oral QHS PRN Chundi, Vahini, MD        Medications Prior to Admission  Medication Sig Dispense Refill Last Dose  .  ALPRAZolam (XANAX) 0.5 MG tablet Take 0.5 mg by mouth daily as needed.   01/25/2019 at Unknown time  . amLODipine (NORVASC) 10 MG tablet Take 1 tablet (10 mg total) by mouth daily. 90 tablet 3 01/25/2019 at Unknown time  . atorvastatin (LIPITOR) 20 MG tablet Take 1 tablet (20 mg total) by mouth daily. 90 tablet 1 01/25/2019 at Unknown time  . doxycycline (MONODOX) 100 MG capsule Take 100 mg by mouth daily.   unknown  . escitalopram (LEXAPRO) 20 MG tablet Take 1 tablet (20 mg total) by mouth daily. 90 tablet 0 01/25/2019 at Unknown time  . ibuprofen (ADVIL) 200 MG tablet Take 400 mg by mouth every 6 (six) hours as needed for moderate pain.   01/25/2019 at Unknown time  . metoprolol succinate (TOPROL-XL) 25 MG 24 hr tablet Take 1 tablet (25 mg total) by mouth daily. 30 tablet 3 01/25/2019 at 0800  . oxyCODONE (OXY IR/ROXICODONE) 5 MG immediate release tablet Take 1-2 tablets (5-10 mg total) by mouth every 6 (six) hours as needed for moderate pain (pain score 4-6). (Patient not taking: Reported on 01/25/2019) 56 tablet 0 Completed Course at Unknown time  . sildenafil (VIAGRA) 100 MG tablet Take 0.5-1 tablets (50-100 mg total) by mouth daily as needed for erectile dysfunction. (Patient not taking: Reported on 01/25/2019) 5 tablet 11 Not Taking at Unknown time    Family History  Problem Relation Age of Onset  . COPD Mother   . Cancer Father   . Atrial fibrillation Father   . Atrial fibrillation Brother   . Cancer Maternal Grandmother        died at age 29  . Kidney disease Maternal Grandmother   . Cancer Maternal Grandfather   . Kidney disease Maternal Grandfather   . Stroke Paternal Grandfather      Review of Systems:     Cardiac Review of Systems: Y or  [    ]= no  Chest Pain [ N   ]  Resting SOB [ N  ] Exertional SOB  [  ]  Orthopnea [  ]   Pedal Edema [   ]    Palpitations Aqua.Slicker  ] Syncope  [  ]   Presyncope [   ]  General Review of Systems: [Y] = yes [  ]=no Constitional: recent weight change [   ]; anorexia [  ]; fatigue [ Y ]; nausea [ Y ]; night sweats [Y ]; fever [Y  ]; or chills [ Y ]                                                               Dental: Last Dentist visit:   Eye : blurred vision [  ]; diplopia [   ]; vision changes [  ];  Amaurosis fugax[  ]; Resp: cough Aqua.Slicker  ];  wheezing[N  ];  hemoptysis[ N ]; shortness of breath[ N ]; paroxysmal nocturnal dyspnea[  ]; dyspnea on exertion[  ]; or orthopnea[  ];  GI:  gallstones[  ], vomiting[ N ];  dysphagia[  ]; melena[  ];  hematochezia [  ]; heartburn[  ];   Hx of  Colonoscopy[  ]; GU: kidney stones [  ]; hematuria[  ];   dysuria [  ];  nocturia[  ];  history of     obstruction [  ]; urinary frequency [  ]             Skin: rash, swelling[  ];, hair loss[  ];  peripheral edema[ Y ];  or itching[  ]; Musculosketetal: myalgias[  ];  joint swelling[  ];  joint erythema[Y, left knee];  joint pain[  ];  back pain[  ];  Heme/Lymph: bruising[  ];  bleeding[  ];  anemia[  ];  Neuro: TIA[  ];  headaches[  ];  stroke[  ];  vertigo[  ];  seizures[  ];   paresthesias[  ];  difficulty walking[y, due to pain in left leg  ];  Psych:depression[  ]; anxiety[  ];  Endocrine: diabetes[  ];  thyroid dysfunction[  ];   Physical Exam: BP (!) 148/87 (BP Location: Right Arm)   Pulse 65   Temp 98.2 F (36.8 C) (Oral)   Resp (!) 22   Ht 5\' 11"  (1.803 m)   Wt 120.9 kg   SpO2 96%   BMI 37.17 kg/m   General appearance: alert, cooperative and no distress Head: Normocephalic, without obvious abnormality, atraumatic Resp: clear to auscultation bilaterally Cardio: regular rate and rhythm + systolic murmur: 3/6,  GI: soft, non-tender; bowel sounds normal; no masses,  no organomegaly Extremities: Left lower leg with erythema, multiple ruptured blisters present, with extension up to lower knee Neurologic: Grossly normal  Diagnostic Studies & Laboratory data:     Recent Radiology Findings:   No results found.   I have independently reviewed the  above radiologic studies and discussed with the patient   Recent Lab Findings: Lab Results  Component Value Date   WBC 10.3 01/30/2019   HGB 12.7 (L) 01/30/2019   HCT 38.5 (L) 01/30/2019   PLT 271 01/30/2019   GLUCOSE 119 (H) 01/30/2019   CHOL 128 10/22/2017   TRIG 53 10/22/2017   HDL 50 10/22/2017   LDLCALC 67 10/22/2017   ALT 94 (H) 01/30/2019   AST 78 (H) 01/30/2019   NA 138 01/30/2019   K 3.2 (L) 01/30/2019   CL 108 01/30/2019   CREATININE 1.18 01/30/2019   BUN 25 (H) 01/30/2019   CO2 20 (L) 01/30/2019   TSH 0.960 10/22/2017   INR 1.2 01/25/2019   HGBA1C 5.7 (H) 01/26/2019    Assessment / Plan:      1. 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 Left lower extremity cellulitis with extension into left knee- orthopedics consult has been requested to evaluate for possible joint involvement 3. ID- patient with high fevers on admission, Infectious disease consult remains pending 4. Dispo- care per primary, continue ABX for vegetation... Dr. [name] to evaluate, will follow up with further recommendations  I  spent 55 minutes counseling the patient face to face.   Ellwood Handler, PA-C  01/30/2019 4:45 PM    I have seen and examined the patient and agree with the assessment as outlined.  Patient is a 68 year old male who is well-known to me from his previous surgery in 2008 at which time he underwent aortic valve replacement using a 25 mm stented bovine pericardial bioprosthetic tissue valve, supra coronary resection and grafting of ascending thoracic aortic aneurysm, and Maze procedure for bicuspid aortic valve disease with severe symptomatic aortic stenosis, moderate aneurysmal enlargement of the ascending thoracic aorta, and atrial fibrillation.  He did not have a Bentall procedure.  Comorbid medical problems include  moderate obesity, hypertension, obstructive sleep apnea, and previous left total knee replacement.  Patient  was reportedly in his usual state of health until recently when he developed sudden onset febrile illness associated with severe cellulitis in his left lower leg.  Blood cultures grew group G Streptococcus.  I have personally reviewed the patient's transthoracic echocardiogram performed yesterday which demonstrates a significant degree of prosthetic valve dysfunction related to moderate calcification of the aortic valve leaflets.  However, there is only mild aortic stenosis and mild aortic insufficiency.  There is a very small "mobile target" on the leaflet of the aortic valve prosthesis which could represent vegetation versus dystrophic tissue related to the degenerating valve leaflets.  There are no other complicating features and no other convincing clinical signs of prosthetic valve endocarditis.  Patient has no signs nor symptoms of acute exacerbation of congestive heart failure and at present he remains afebrile.  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  There are no indications for any type of surgical intervention at this time.  The patient should undergo follow-up echocardiogram after his course of antibiotics have been completed and he should continue to undergo follow-up echocardiograms at least on an annual basis because of the underlying prosthetic valve dysfunction.  The patient does have significant venous insufficiency in his left lower leg which may have contributed to the development of acute cellulitis.  I recommend lower extremity venous duplex scan to rule out deep venous thrombosis.   I spent in excess of 60 minutes during the conduct of this hospital encounter and >50% of this time involved direct face-to-face encounter with the patient for counseling and/or coordination of their care.    Rexene Alberts, MD 01/31/2019 9:33 AM

## 2019-01-30 NOTE — Progress Notes (Signed)
[facility]   Reason for visit: Follow up on Group G Streptococcal sepsis and LT leg cellulitis  Antimicrobials: Total ABX days, 6 Ampicillin, day 3   Interval History: Pt remains lethargic but states he was able to ambulate with less pain to his LT leg this AM. He underwent TEE this morning. I was then contacted by the performing cardiologist that he appeared to have a small mobile echodensity noted along one of the leaflets to his bioprosthetic aortic valve. Fever curves, WBC and Cr trends, blood cx results, imaging and TEE report all independently reviewed    Current Facility-Administered Medications:    acetaminophen (TYLENOL) tablet 650 mg, 650 mg, Oral, Q6H PRN, 650 mg at 01/26/19 1135 **OR** acetaminophen (TYLENOL) suppository 650 mg, 650 mg, Rectal, Q6H PRN, Chundi, Vahini, MD   ALPRAZolam Duanne Moron) tablet 0.5 mg, 0.5 mg, Oral, Daily PRN, Chundi, Vahini, MD, 0.5 mg at 01/28/19 2128   ampicillin (OMNIPEN) 2 g in sodium chloride 0.9 % 100 mL IVPB, 2 g, Intravenous, Q4H, Katherine Roan, MD, Last Rate: 300 mL/hr at 01/30/19 1207, 2 g at 01/30/19 1207   atorvastatin (LIPITOR) tablet 20 mg, 20 mg, Oral, Daily, Chundi, Vahini, MD, 20 mg at 01/30/19 1110   diclofenac sodium (VOLTAREN) 1 % transdermal gel 2 g, 2 g, Topical, QID, Ladona Horns, MD, 2 g at 01/30/19 1119   enoxaparin (LOVENOX) injection 40 mg, 40 mg, Subcutaneous, Daily, Chundi, Vahini, MD, 40 mg at 01/30/19 1116   escitalopram (LEXAPRO) tablet 20 mg, 20 mg, Oral, Daily, Chundi, Vahini, MD, 20 mg at 01/30/19 1110   fluticasone (FLONASE) 50 MCG/ACT nasal spray 1 spray, 1 spray, Each Nare, Daily, Katherine Roan, MD, 1 spray at 01/30/19 1111   [COMPLETED] ipratropium-albuterol (DUONEB) 0.5-2.5 (3) MG/3ML nebulizer solution 3 mL, 3 mL, Nebulization, Once, 3 mL at 01/28/19 0807 **FOLLOWED BY** ipratropium-albuterol (DUONEB) 0.5-2.5 (3) MG/3ML nebulizer solution 3 mL, 3 mL, Nebulization, Q8H  PRN, Katherine Roan, MD, 3 mL at 01/29/19 0620   oxyCODONE-acetaminophen (PERCOCET/ROXICET) 5-325 MG per tablet 1 tablet, 1 tablet, Oral, BID PRN, Ladona Horns, MD, 1 tablet at 01/28/19 [id number]   potassium chloride SA (K-DUR) CR tablet 40 mEq, 40 mEq, Oral, BID, Madalyn Rob, MD, 40 mEq at 01/30/19 1111   promethazine (PHENERGAN) tablet 12.5 mg, 12.5 mg, Oral, Q6H PRN, Chundi, Vahini, MD   senna-docusate (Senokot-S) tablet 1 tablet, 1 tablet, Oral, QHS PRN, Lars Mage, MD   Physical Exam:   Vitals:   01/30/19 0952 01/30/19 1150  BP: 138/76 (!) 148/87  Pulse: 64 65  Resp: (!) 22 (!) 22  Temp: 98.5 F (36.9 C) 98.2 F (36.8 C)  SpO2: 92% 96%   Gen: pleasant, chronically ill/lethargic, obese, A&Ox 3 Head: NCAT, no temporal wasting evident EENT: PERRL, EOMI, MMM, adequate dentition Neck: supple, no JVD CV: NRRR, I/VI diastolic murmur heard loudest along his LUSB Pulm: CTA bilaterally, no wheeze or retractions Abd: soft, NTND, +BS Extrems: 1+ LT LE edema, 2+ pulses, well-healed incision to his LT knee Skin: ruptured hemorrhagic bullae noted to anterior distal LT leg with improving surrounding erythema, adequate skin turgor Neuro: CN II-XII grossly intact, no focal neurologic deficits appreciated, gait was not assessed, A&Ox 3   Review of Systems:  Review of Systems  Constitutional: Negative for chills, fever and weight loss.  HENT: Negative for congestion, hearing loss, sinus pain and sore throat.   Eyes: Negative for blurred vision, photophobia and discharge.  Respiratory: Negative for  cough, hemoptysis and shortness of breath.   Cardiovascular: Negative for chest pain, palpitations, orthopnea and leg swelling.  Gastrointestinal: Negative for abdominal pain, constipation, diarrhea, heartburn, nausea and vomiting.  Genitourinary: Negative for dysuria, flank pain, frequency and urgency.  Musculoskeletal: Negative for back pain, joint pain and myalgias.  Skin: Positive for  rash. Negative for itching.       LT distal leg cellulitis  Neurological: Positive for weakness. Negative for tremors, seizures and headaches.  Endo/Heme/Allergies: Negative for polydipsia. Does not bruise/bleed easily.  Psychiatric/Behavioral: Negative for depression and substance abuse. The patient is not nervous/anxious and does not have insomnia.      Lab Results  Component Value Date   WBC 10.3 01/30/2019   HGB 12.7 (L) 01/30/2019   HCT 38.5 (L) 01/30/2019   MCV 90.8 01/30/2019   PLT 271 01/30/2019    Lab Results  Component Value Date   CREATININE 1.18 01/30/2019   BUN 25 (H) 01/30/2019   NA 138 01/30/2019   K 3.2 (L) 01/30/2019   CL 108 01/30/2019   CO2 20 (L) 01/30/2019    Lab Results  Component Value Date   ALT 94 (H) 01/30/2019   AST 78 (H) 01/30/2019   ALKPHOS 66 01/30/2019     Microbiology: Recent Results (from the past 240 hour(s))  Blood Culture (routine x 2)     Status: Abnormal   Collection Time: 01/25/19  7:28 PM   Specimen: BLOOD RIGHT FOREARM  Result Value Ref Range Status   Specimen Description BLOOD RIGHT FOREARM  Final   Special Requests   Final    BOTTLES DRAWN AEROBIC AND ANAEROBIC Blood Culture results may not be optimal due to an inadequate volume of blood received in culture bottles   Culture  Setup Time   Final    GRAM POSITIVE COCCI IN CHAINS IN BOTH AEROBIC AND ANAEROBIC BOTTLES CRITICAL RESULT CALLED TO, READ BACK BY AND VERIFIED WITH: Karsten Ro PHARMD, AT [id number] 01/26/19 BY Rush Landmark Performed at [facility], [place] [address], [place], [place] [address]    Culture STREPTOCOCCUS GROUP G (A)  Final   Report Status 01/28/2019 FINAL  Final   Organism ID, Bacteria STREPTOCOCCUS GROUP G  Final      Susceptibility   Streptococcus group g - MIC*    CLINDAMYCIN >=1 RESISTANT Resistant     AMPICILLIN <=0.25 SENSITIVE Sensitive     ERYTHROMYCIN >=8 RESISTANT Resistant     VANCOMYCIN 0.5 SENSITIVE Sensitive     CEFTRIAXONE <=0.12 SENSITIVE  Sensitive     LEVOFLOXACIN 0.5 SENSITIVE Sensitive     PENICILLIN Value in next row Sensitive      SENSITIVE<=0.06    * STREPTOCOCCUS GROUP G  Blood Culture ID Panel (Reflexed)     Status: Abnormal   Collection Time: 01/25/19  7:28 PM  Result Value Ref Range Status   Enterococcus species NOT DETECTED NOT DETECTED Final   Listeria monocytogenes NOT DETECTED NOT DETECTED Final   Staphylococcus species NOT DETECTED NOT DETECTED Final   Staphylococcus aureus (BCID) NOT DETECTED NOT DETECTED Final   Streptococcus species DETECTED (A) NOT DETECTED Final    Comment: Not Enterococcus species, Streptococcus agalactiae, Streptococcus pyogenes, or Streptococcus pneumoniae. CRITICAL RESULT CALLED TO, READ BACK BY AND VERIFIED WITH: J. LEDFORD PHARMD AT [id number] 01/26/19 BY D. VANHOOK    Streptococcus agalactiae NOT DETECTED NOT DETECTED Final   Streptococcus pneumoniae NOT DETECTED NOT DETECTED Final   Streptococcus pyogenes NOT DETECTED NOT DETECTED Final  Acinetobacter baumannii NOT DETECTED NOT DETECTED Final   Enterobacteriaceae species NOT DETECTED NOT DETECTED Final   Enterobacter cloacae complex NOT DETECTED NOT DETECTED Final   Escherichia coli NOT DETECTED NOT DETECTED Final   Klebsiella oxytoca NOT DETECTED NOT DETECTED Final   Klebsiella pneumoniae NOT DETECTED NOT DETECTED Final   Proteus species NOT DETECTED NOT DETECTED Final   Serratia marcescens NOT DETECTED NOT DETECTED Final   Haemophilus influenzae NOT DETECTED NOT DETECTED Final   Neisseria meningitidis NOT DETECTED NOT DETECTED Final   Pseudomonas aeruginosa NOT DETECTED NOT DETECTED Final   Candida albicans NOT DETECTED NOT DETECTED Final   Candida glabrata NOT DETECTED NOT DETECTED Final   Candida krusei NOT DETECTED NOT DETECTED Final   Candida parapsilosis NOT DETECTED NOT DETECTED Final   Candida tropicalis NOT DETECTED NOT DETECTED Final    Comment: Performed at [facility], [place] [address], [place], [place]  [address]  Blood Culture (routine x 2)     Status: Abnormal   Collection Time: 01/25/19  7:30 PM   Specimen: BLOOD LEFT HAND  Result Value Ref Range Status   Specimen Description BLOOD LEFT HAND  Final   Special Requests   Final    BOTTLES DRAWN AEROBIC AND ANAEROBIC Blood Culture results may not be optimal due to an inadequate volume of blood received in culture bottles   Culture  Setup Time   Final    GRAM POSITIVE COCCI IN CHAINS IN BOTH AEROBIC AND ANAEROBIC BOTTLES CRITICAL VALUE NOTED.  VALUE IS CONSISTENT WITH PREVIOUSLY REPORTED AND CALLED VALUE.    Culture (A)  Final    STREPTOCOCCUS GROUP G SUSCEPTIBILITIES PERFORMED ON PREVIOUS CULTURE WITHIN THE LAST 5 DAYS. Performed at [facility], [place] [address], [place], [place] [address]    Report Status 01/28/2019 FINAL  Final  SARS Coronavirus 2 [facility] order, Performed in [facility] hospital lab) Nasopharyngeal Nasopharyngeal Swab     Status: None   Collection Time: 01/25/19  9:51 PM   Specimen: Nasopharyngeal Swab  Result Value Ref Range Status   SARS Coronavirus 2 NEGATIVE NEGATIVE Final    Comment: (NOTE) If result is NEGATIVE SARS-CoV-2 target nucleic acids are NOT DETECTED. The SARS-CoV-2 RNA is generally detectable in upper and lower  respiratory specimens during the acute phase of infection. The lowest  concentration of SARS-CoV-2 viral copies this assay can detect is 250  copies / mL. A negative result does not preclude SARS-CoV-2 infection  and should not be used as the sole basis for treatment or other  patient management decisions.  A negative result may occur with  improper specimen collection / handling, submission of specimen other  than nasopharyngeal swab, presence of viral mutation(s) within the  areas targeted by this assay, and inadequate number of viral copies  (<250 copies / mL). A negative result must be combined with clinical  observations, patient history, and epidemiological information. If  result is POSITIVE SARS-CoV-2 target nucleic acids are DETECTED. The SARS-CoV-2 RNA is generally detectable in upper and lower  respiratory specimens dur ing the acute phase of infection.  Positive  results are indicative of active infection with SARS-CoV-2.  Clinical  correlation with patient history and other diagnostic information is  necessary to determine patient infection status.  Positive results do  not rule out bacterial infection or co-infection with other viruses. If result is PRESUMPTIVE POSTIVE SARS-CoV-2 nucleic acids MAY BE PRESENT.   A presumptive positive result was obtained on the submitted  specimen  and confirmed on repeat testing.  While 2019 novel coronavirus  (SARS-CoV-2) nucleic acids may be present in the submitted sample  additional confirmatory testing may be necessary for epidemiological  and / or clinical management purposes  to differentiate between  SARS-CoV-2 and other Sarbecovirus currently known to infect humans.  If clinically indicated additional testing with an alternate test  methodology [phone]) is advised. The SARS-CoV-2 RNA is generally  detectable in upper and lower respiratory sp ecimens during the acute  phase of infection. The expected result is Negative. Fact Sheet for Patients:  StrictlyIdeas.no Fact Sheet for Healthcare Providers: BankingDealers.co.za This test is not yet approved or cleared by the [place] FDA and has been authorized for detection and/or diagnosis of SARS-CoV-2 by FDA under an Emergency Use Authorization (EUA).  This EUA will remain in effect (meaning this test can be used) for the duration of the COVID-19 declaration under Section 564(b)(1) of the Act, 21 U.S.C. section 360bbb-3(b)(1), unless the authorization is terminated or revoked sooner. Performed at [facility], [place] [address], [place], [place] [address]   Urine culture     Status: None   Collection  Time: 01/26/19  1:07 AM   Specimen: In/Out Cath Urine  Result Value Ref Range Status   Specimen Description IN/OUT CATH URINE  Final   Special Requests NONE  Final   Culture   Final    NO GROWTH Performed at [facility], [place] [address] [place], [place] [address]    Report Status 01/27/2019 FINAL  Final  Culture, blood (routine x 2)     Status: None (Preliminary result)   Collection Time: 01/28/19 10:58 AM   Specimen: BLOOD RIGHT HAND  Result Value Ref Range Status   Specimen Description BLOOD RIGHT HAND  Final   Special Requests AEROBIC BOTTLE ONLY Blood Culture adequate volume  Final   Culture   Final    NO GROWTH 2 DAYS Performed at [facility], [place] [address] [place], [place] [address]    Report Status PENDING  Incomplete  Culture, blood (routine x 2)     Status: None (Preliminary result)   Collection Time: 01/28/19 11:04 AM   Specimen: BLOOD LEFT HAND  Result Value Ref Range Status   Specimen Description BLOOD LEFT HAND  Final   Special Requests AEROBIC BOTTLE ONLY Blood Culture adequate volume  Final   Culture   Final    NO GROWTH 2 DAYS Performed at [facility], [place] [address] [place], [place] [address]    Report Status PENDING  Incomplete    Impression/Plan: The patient is a 68 y/o WM with h/o AoVR in 2008 admitted with fever, ARF, LT leg cellulitis and Group G Streptococcal sepsis.  1. Group G Strep sepsis/PVIE -initial blood cultures have been positive for group G streptococcus with his most likely source being his left leg cellulitis.  While his transthoracic echocardiogram was mostly unrevealing, today's transesophageal echocardiogram showed a preserved ejection fraction but mild aortic stenosis with a small mobile vegetation noted to his bioprosthetic aortic valve.  He has a low MIC to penicillin on his isolate sensitivities of less than 0.06, so we will continue ampicillin 2 g IV every 4 hours for the core portion of his treatment.  Given  concern for now PVIE, will also add rifampin 300 mg p.o. twice daily as an adjunctive measure.  Given the high likelihood of failure of medical monotherapy with prosthetic valve endocarditis despite  optimized antibiotics, I would advise CT surgery be consulted for an evaluation on this patient.  If intervention is deferred at this time, it would still be advised that he follow with him as well as our service as his risk for decline and development of subsequent CHF remains a high probability while he remains on treatment.  Would anticipate a 6-week course of ampicillin and rifampin combination therapy at this time.  Repeat blood cultures thus far remain negative.  I would hold off on placing a PICC line until the patient's repeat blood cultures are negative for greater than 48 hours.  2. LT leg cellulitis -this is the most likely source of the patient's group B strep bacteremia and now subsequently confirmed prosthetic valve endocarditis.  We will adjust antibiotics as noted above.  The patient reports no pain with efforts to ambulate to his left knee at this time.  He has had hemorrhagic conversion of the bullae noted as a component of his cellulitis.  This clinical evolution is to a certain degree expected with streptococcal cellulitis.  I have emphasized with the patient the need for him to elevate his left leg to avoid further complications such as soft tissue abscesses.  3. Fever -patient's T-max on the day of admission on August 27 was 104.1.  With empiric antibiotics he has had steady decline in his T-max daily and has now been afebrile for the last several days.  Follow-up repeat blood cultures as noted above.  Repeat any blood cultures for fevers greater than 100.5.  4. ARF - Peak Cr was 1.96 3 days ago. Cr has now returned to WNL at 1.18 today.  Dosing of both antibiotics will be for creatinine clearance of greater than 60 at this time.

## 2019-01-30 NOTE — Progress Notes (Signed)
Physical Therapy Treatment Patient Details Name: [name] [name] MRN: [id number] DOB: 02-12-1951 Today's Date: 01/30/2019    History of Present Illness Pt is a 68 y/o male admitted secondary to fever, chills, and LLE cellulitis. CT of head negative for acute abnormality. PMH includes HTN, s/p AVR, OSA, and aortic aneurysm s/p repair.     PT Comments    Pt reports not sleeping well after condom cath came off last night. Pt with increased ability with transfers able to stand from bed and [facility] without physical assist as well as increase speed and tolerance for gait. Activity limited by pt transfer OOR for TEE. D/C plan for HHPT remains appropriate if pt able to ascend steps to enter home. Encouraged continued mobility and up to chair after return from test. Will continue to follow.     Follow Up Recommendations  Home health PT;Supervision for mobility/OOB     Equipment Recommendations  Rolling walker with 5" wheels    Recommendations for Other Services       Precautions / Restrictions Precautions Precautions: Fall    Mobility  Bed Mobility Overal bed mobility: Needs Assistance Bed Mobility: Supine to Sit     Supine to sit: Modified independent (Device/Increase time);HOB elevated Sit to supine: Min assist   General bed mobility comments: HOb 10 degrees with use of rail to get to EOB. Return to bed with assist to elevate bil LE to surface  Transfers Overall transfer level: Needs assistance   Transfers: Sit to/from Stand Sit to Stand: Min guard         General transfer comment: cues for hand placement to stand from bed and BSC  Ambulation/Gait Ambulation/Gait assistance: Min guard Gait Distance (Feet): 40 Feet Assistive device: Rolling walker (2 wheeled) Gait Pattern/deviations: Step-through pattern;Decreased stride length;Trunk flexed   Gait velocity interpretation: 1.31 - 2.62 ft/sec, indicative of limited community ambulator General Gait Details: cues for posture and  proximity to RW gait limited by fatigue and transporters arrival for TEE   Stairs             Wheelchair Mobility    Modified Rankin (Stroke Patients Only)       Balance Overall balance assessment: Needs assistance Sitting-balance support: No upper extremity supported;Feet supported Sitting balance-Leahy Scale: Fair     Standing balance support: Bilateral upper extremity supported;During functional activity   Standing balance comment: reliant on bil UE support                            Cognition Arousal/Alertness: Awake/alert Behavior During Therapy: WFL for tasks assessed/performed Overall Cognitive Status: Within Functional Limits for tasks assessed                                        Exercises      General Comments        Pertinent Vitals/Pain Pain Assessment: No/denies pain    Home Living                      Prior Function            PT Goals (current goals can now be found in the care plan section) Progress towards PT goals: Progressing toward goals    Frequency           PT Plan Discharge plan needs to be updated  Co-evaluation              AM-PAC PT "6 Clicks" Mobility   Outcome Measure  Help needed turning from your back to your side while in a flat bed without using bedrails?: A Little Help needed moving from lying on your back to sitting on the side of a flat bed without using bedrails?: A Little Help needed moving to and from a bed to a chair (including a wheelchair)?: A Little Help needed standing up from a chair using your arms (e.g., wheelchair or bedside chair)?: A Little Help needed to walk in hospital room?: A Little Help needed climbing 3-5 steps with a railing? : A Lot 6 Click Score: 17    End of Session Equipment Utilized During Treatment: Gait belt Activity Tolerance: Patient tolerated treatment well Patient left: in bed;with call bell/phone within reach;Other  (comment)(with transporters) Nurse Communication: Mobility status PT Visit Diagnosis: Difficulty in walking, not elsewhere classified (R26.2);Muscle weakness (generalized) (M62.81);Pain     Time: 0715-0738 PT Time Calculation (min) (ACUTE ONLY): 23 min  Charges:  $Gait Training: 8-22 mins $Therapeutic Activity: 8-22 mins                     [place], PT Acute Rehabilitation Services Pager: [phone] Office: [place] 01/30/2019, 7:43 AM

## 2019-01-30 NOTE — CV Procedure (Addendum)
Brief TEE Note  LVEF 60-65% Bioprosthetic AVR Mild aortic stenosis.  Mean aortic valve gradient 18 mmHg Mild TR, mild PR, mild MR Left atrial appendage surgically absent Atherosclerosis of the descending aorta No evidence of endocarditis  For additional details see full report.  During this procedure the patient is administered a total of Benadryl 25mg , Versed 4 mg and Fentanyl 100 mcg to achieve and maintain moderate conscious sedation.  The patient's heart rate, blood pressure, and oxygen saturation are monitored continuously during the procedure. The period of conscious sedation is 30 minutes, of which I was present face-to-face 100% of this time.     Addendum: On further review, there appears to be a small (0.4cm x 0.5cm) mobile density on the bioprosthetic aortic valve.  The aortic valve leaflet in the R coronary cusp position is calcified an partially fixed.   Willow Reczek C. Oval Linsey, MD, [facility] 01/30/2019 9:22 AM

## 2019-01-30 NOTE — Progress Notes (Signed)
Subjective: Patient was doing well after returning from procedure.  He denied any issues.  Feels like his legs have been slightly improving, feels like the redness is decreased but still concerned about the dark areas.   Objective:  Vital signs in last 24 hours: Vitals:   01/30/19 0935 01/30/19 0940 01/30/19 0952 01/30/19 1150  BP: (!) 154/78  138/76 (!) 148/87  Pulse: 65 65 64 65  Resp: (!) 24 (!) 24 (!) 22 (!) 22  Temp:   98.5 F (36.9 C) 98.2 F (36.8 C)  TempSrc:   Oral Oral  SpO2: 96% 96% 92% 96%  Weight:      Height:        General: Elderly male, no acute distress, sitting in chair Cardiac: Systolic murmur loudest in right second intercostal space, regular rate and rhythm Pulmonary: Minimal wheezing throughout lungs, normal work of breathing Abdomen: Soft, nontender, nondistended, normoactive bowel sounds Extremity: Left lower extremity has area of erythema that has remained stable with darkened discolored purpura and some peeling    Assessment/Plan:  Principal Problem:   Streptococcal bacteremia Active Problems:   Essential hypertension   Sleep apnea   BMI 38.0-38.9,adult   S/P aortic valve replacement with bioprosthetic valve   Hyponatremia   Cellulitis of leg without foot, left   AKI (acute kidney injury) (HCC)   Pressure injury of skin   S/P total knee arthroplasty, left  This is a 68 year old male with history of bicuspid aortic valve status post aortic valve replacement in 2008, ascending aortic aneurysm status post repair 2008, left knee replacement 2019, hypertension, hyperlipidemia, and OSA presented with weakness and fatigue and was noted to have LLE cellulitis and group G strep bacteremia.   Left lower extremity cellulitis: Area has healing areas of darkened bulla, decreased erythema and pain around the area.  Patient remains afebrile and leukocytosis has improved.   -Continue ampicillin, day 3 -ID following, appreciate recommendations -Monitor CBC  and BMP -Continue wound care consult  Group G strep bacteremia: Blood cultures from 8/27 grew group G strep.  Repeat blood cultures on 8/30 shows no growth to date.  TTE showed normal EF and no vegetation.  TEE on 9/1 showed small mobile density (0.4cm x 0.5cm) on bioprosthetic aortic valve.  Given new vegetation and bacteremia we will consult CTS for evaluation. -Continue ampicillin, day 3 -Follow-up blood cultures -CTS consulted  Aortic value and ascending aortic aneurysm repair (2008)   Pt with history of bicuspid valve and ascending aortic aneurysm s/p repair, with bovine valve. Echo from 2017 showed some diastolic dysfunction and normal functioning valve with EF 60-65%. Repeat echo on 8/30 showed EF 123456, RV systolic pressure mildly elevated, moderate stenosis of the aortic valve with higher gradient and worsened dimensionless index representing true deterioration of valve function.  Concern for nature vegetation on TEE -CTS consulted  AKI: Admission creatinine 1.79, due to dehydration versus rhabdomyolysis.  CK down trended from 30 600-4 27.  Improving with IV fluids.  Creatinine 1.18 today -Monitor BMP  Hypokalemia: Potassium 3.2 today, repleting. -Repeat CMP in a.m.  Hypertension: -BP ranged from 138-197/59-87. On amlodipine 10 mg daily and metoprolol 25 mg daily at home -monitor for now  Hyperlipidemia: Continue home atorvastatin 20 mg daily  Anxiety: Continue home Xanax 0.5 mg daily as needed and Lexapro 20 mg daily  FEN: No fluids, replete lytes prn, HH diet VTE ppx: Lovenox  Code Status: FULL  Dispo: Anticipated discharge is pending clinical improvement.   Tamsen Snider,  MD PGY1  [phone]

## 2019-01-30 NOTE — Progress Notes (Signed)
Inpatient Rehab Admissions Coordinator:   Note updated PT/OT recs to home health f/u.  Will sign off for CIR at this time.   Shann Medal, PT, DPT Admissions Coordinator [phone] 01/30/19  10:45 AM

## 2019-01-30 NOTE — Progress Notes (Signed)
  Echocardiogram Echocardiogram Transesophageal has been performed.  [name] [name] 01/30/2019, 9:34 AM

## 2019-01-30 NOTE — Progress Notes (Signed)
Occupational Therapy Treatment Patient Details Name: [name] [name] MRN: [id number] DOB: Jan 29, 1951 Today's Date: 01/30/2019    History of present illness Pt is a 68 y/o male admitted secondary to fever, chills, and LLE cellulitis. CT of head negative for acute abnormality. PMH includes HTN, s/p AVR, OSA, and aortic aneurysm s/p repair.    OT comments  Patient progressing well.  Completing transfers from EOB with min guard, transfers from regular commode with mod assist-- discussed home setup as patient with heavy reliance on BUE support to push up from commode and recommending 3:1 for dc home.  Educated on AE for LB self care, able to figure 4 technique to don/doff B socks and use of reacher to assist with pants.  In room mobility using RW with min guard for safety.  Updated dc plan to [place], recommend initial 24/7 support.    Follow Up Recommendations  Home health OT;Supervision/Assistance - 24 hour    Equipment Recommendations  3 in 1 bedside commode    Recommendations for Other Services      Precautions / Restrictions Precautions Precautions: Fall Restrictions Weight Bearing Restrictions: No       Mobility Bed Mobility Overal bed mobility: Needs Assistance Bed Mobility: Supine to Sit     Supine to sit: Modified independent (Device/Increase time)     General bed mobility comments: no assist required, incresed time and effort  Transfers Overall transfer level: Needs assistance Equipment used: Rolling walker (2 wheeled) Transfers: Sit to/from Stand Sit to Stand: Min guard;Mod assist         General transfer comment: min guard from EOB, mod assist to power up from commode with grabbars on R side only (relaint on B UE support to push up from)    Balance Overall balance assessment: Needs assistance Sitting-balance support: No upper extremity supported;Feet supported Sitting balance-Leahy Scale: Fair     Standing balance support: Bilateral upper extremity  supported;During functional activity Standing balance-Leahy Scale: Poor Standing balance comment: relaint on BUE support in standing                            ADL either performed or assessed with clinical judgement   ADL Overall ADL's : Needs assistance/impaired             Lower Body Bathing: Minimal assistance;Cueing for compensatory techniques;Sit to/from stand Lower Body Bathing Details (indicate cue type and reason): educated on use of long sponge for ease to reach LB and sitting for safety, able to complete figure 4 technique with effort      Lower Body Dressing: Minimal assistance;With adaptive equipment;Cueing for compensatory techniques;Sit to/from stand Lower Body Dressing Details (indicate cue type and reason): patient able to complete figure 4 technique to don socks, increased effort for L LE; min guard for sit to stand Toilet Transfer: Moderate assistance;Ambulation;Regular Toilet;Grab bars Toilet Transfer Details (indicate cue type and reason): mod assist to power up from comfort height commode, heavy reliance on B UE support to stand  Toileting- Clothing Manipulation and Hygiene: Supervision/safety;Sitting/lateral lean       Functional mobility during ADLs: Min guard;Rolling walker       Vision       Perception     Praxis      Cognition Arousal/Alertness: Awake/alert Behavior During Therapy: WFL for tasks assessed/performed Overall Cognitive Status: Within Functional Limits for tasks assessed  Exercises     Shoulder Instructions       General Comments      Pertinent Vitals/ Pain       Pain Assessment: No/denies pain  Home Living                                          Prior Functioning/Environment              Frequency  Min 3X/week        Progress Toward Goals  OT Goals(current goals can now be found in the care plan section)  Progress  towards OT goals: Progressing toward goals  Acute Rehab OT Goals Patient Stated Goal: to get home  OT Goal Formulation: With patient  Plan Frequency remains appropriate;Discharge plan needs to be updated    Co-evaluation                 AM-PAC OT "6 Clicks" Daily Activity     Outcome Measure   Help from another person eating meals?: None Help from another person taking care of personal grooming?: A Little Help from another person toileting, which includes using toliet, bedpan, or urinal?: A Little Help from another person bathing (including washing, rinsing, drying)?: A Little Help from another person to put on and taking off regular upper body clothing?: A Little Help from another person to put on and taking off regular lower body clothing?: A Little 6 Click Score: 19    End of Session Equipment Utilized During Treatment: Rolling walker;Gait belt  OT Visit Diagnosis: Unsteadiness on feet (R26.81);Other abnormalities of gait and mobility (R26.89);Muscle weakness (generalized) (M62.81);Pain Pain - Right/Left: Left Pain - part of body: Leg   Activity Tolerance Patient tolerated treatment well   Patient Left in chair;with call bell/phone within reach;with chair alarm set   Nurse Communication Mobility status        Time: [id number] OT Time Calculation (min): 44 min  Charges: OT General Charges $OT Visit: 1 Visit OT Treatments $Self Care/Home Management : 38-52 mins  Delight Stare, [place] Pager [phone] Office [phone]     Delight Stare 01/30/2019, 5:28 PM

## 2019-01-30 NOTE — H&P (Signed)
[name] [name] is a 68 y.o. male who has presented today for surgery, with the diagnosis of bacteremia.  The various methods of treatment have been discussed with the patient and family. After consideration of risks, benefits and other options for treatment, the patient has consented to  Procedure(s): TRANSESOPHAGEAL ECHOCARDIOGRAM (TEE) (N/A) as a surgical intervention .  The patient's history has been reviewed, patient examined, no change in status, stable for surgery.  I have reviewed the patient's chart and labs.  Questions were answered to the patient's satisfaction.    [name], MD, [facility]  01/30/2019 9:40 AM

## 2019-01-31 ENCOUNTER — Inpatient Hospital Stay: Payer: Self-pay

## 2019-01-31 ENCOUNTER — Inpatient Hospital Stay (HOSPITAL_COMMUNITY): Payer: BC Managed Care – PPO

## 2019-01-31 DIAGNOSIS — R52 Pain, unspecified: Secondary | ICD-10-CM

## 2019-01-31 DIAGNOSIS — L538 Other specified erythematous conditions: Secondary | ICD-10-CM

## 2019-01-31 DIAGNOSIS — M7989 Other specified soft tissue disorders: Secondary | ICD-10-CM

## 2019-01-31 DIAGNOSIS — R0981 Nasal congestion: Secondary | ICD-10-CM

## 2019-01-31 LAB — COMPREHENSIVE METABOLIC PANEL
ALT: 106 U/L — ABNORMAL HIGH (ref 0–44)
AST: 75 U/L — ABNORMAL HIGH (ref 15–41)
Albumin: 2.3 g/dL — ABNORMAL LOW (ref 3.5–5.0)
Alkaline Phosphatase: 64 U/L (ref 38–126)
Anion gap: 11 (ref 5–15)
BUN: 19 mg/dL (ref 8–23)
CO2: 21 mmol/L — ABNORMAL LOW (ref 22–32)
Calcium: 8.1 mg/dL — ABNORMAL LOW (ref 8.9–10.3)
Chloride: 107 mmol/L (ref 98–111)
Creatinine, Ser: 1.09 mg/dL (ref 0.61–1.24)
GFR calc Af Amer: >60 mL/min (ref >60)
GFR calc non Af Amer: >60 mL/min (ref >60)
Glucose, Bld: 112 mg/dL — ABNORMAL HIGH (ref 70–99)
Potassium: 3.5 mmol/L (ref 3.5–5.1)
Sodium: 139 mmol/L (ref 135–145)
Total Bilirubin: 1.1 mg/dL (ref 0.3–1.2)
Total Protein: 5.7 g/dL — ABNORMAL LOW (ref 6.5–8.1)

## 2019-01-31 LAB — CBC
HCT: 37.5 % — ABNORMAL LOW (ref 39.0–52.0)
Hemoglobin: 12.5 g/dL — ABNORMAL LOW (ref 13.0–17.0)
MCH: 30.2 pg (ref 26.0–34.0)
MCHC: 33.3 g/dL (ref 30.0–36.0)
MCV: 90.6 fL (ref 80.0–100.0)
Platelets: 366 10*3/uL (ref 150–400)
RBC: 4.14 MIL/uL — ABNORMAL LOW (ref 4.22–5.81)
RDW: 14.4 % (ref 11.5–15.5)
WBC: 12.1 10*3/uL — ABNORMAL HIGH (ref 4.0–10.5)
nRBC: 0 % (ref 0.0–0.2)

## 2019-01-31 MED ORDER — RIFAMPIN 300 MG PO CAPS
300.0000 mg | ORAL_CAPSULE | Freq: Three times a day (TID) | ORAL | Status: DC
  Administered 2019-01-31 – 2019-02-01 (×5): 300 mg via ORAL
  Filled 2019-01-31 (×6): qty 1

## 2019-01-31 MED ORDER — POTASSIUM CHLORIDE CRYS ER 20 MEQ PO TBCR
20.0000 meq | EXTENDED_RELEASE_TABLET | Freq: Two times a day (BID) | ORAL | Status: DC
  Administered 2019-01-31 (×2): 20 meq via ORAL
  Filled 2019-01-31 (×2): qty 1

## 2019-01-31 MED ORDER — RIFAMPIN 300 MG PO CAPS: 300.0000 mg | ORAL_CAPSULE | Freq: Three times a day (TID) | ORAL | 0 refills | Status: AC

## 2019-01-31 MED ORDER — PENICILLIN G POTASSIUM IV (FOR PTA / DISCHARGE USE ONLY): 24.0000 10*6.[IU] | INTRAVENOUS | 0 refills | Status: DC

## 2019-01-31 MED ORDER — PENICILLIN G POTASSIUM 20000000 UNITS IJ SOLR
12.0000 10*6.[IU] | Freq: Two times a day (BID) | INTRAVENOUS | Status: DC
  Administered 2019-01-31 – 2019-02-01 (×2): 12 10*6.[IU] via INTRAVENOUS
  Filled 2019-01-31 (×5): qty 12

## 2019-01-31 MED ORDER — AMLODIPINE BESYLATE 10 MG PO TABS
10.0000 mg | ORAL_TABLET | Freq: Every day | ORAL | Status: DC
  Administered 2019-01-31 – 2019-02-01 (×2): 10 mg via ORAL
  Filled 2019-01-31 (×2): qty 1

## 2019-01-31 MED FILL — rifAMPin 300 MG CAPS: 300 | 35 days supply | Qty: 105 | Fill #0

## 2019-01-31 NOTE — Progress Notes (Signed)
amLOdipine order for patient this am, the med is one of the med patient allergic to, MD notified, MD instruct okay to give patient the medicine. Will continue to monitor the patient.

## 2019-01-31 NOTE — Progress Notes (Signed)
[facility]   Reason for visit: Follow up on Group G Streptococcal sepsis and LT leg cellulitis  Antimicrobials: Total ABX days, 7 Ampicillin, day 4 Rifampin, day 2   Interval History: Pt tolerated addition of rifampin well o/n. He was assessed by CT surgeon this AM and decision was made to defer surgical intervention at this time. His LT leg was feeling better and he continues to ambulate with more ease than PTA. Appetite steadily improving as well. Fever curves, WBC and Cr trends, blood cx results, imaging and TEE report all independently reviewed    Current Facility-Administered Medications:    0.9 %  sodium chloride infusion, , Intravenous, PRN, Sid Falcon, MD, Last Rate: 10 mL/hr at 01/30/19 2149, 250 mL at 01/30/19 2149   acetaminophen (TYLENOL) tablet 650 mg, 650 mg, Oral, Q6H PRN, 650 mg at 01/30/19 1529 **OR** acetaminophen (TYLENOL) suppository 650 mg, 650 mg, Rectal, Q6H PRN, Chundi, Vahini, MD   ALPRAZolam Duanne Moron) tablet 0.5 mg, 0.5 mg, Oral, Daily PRN, Chundi, Vahini, MD, 0.5 mg at 01/28/19 2128   amLODipine (NORVASC) tablet 10 mg, 10 mg, Oral, Daily, Sherry Ruffing, Marissa M, MD, 10 mg at 01/31/19 1001   atorvastatin (LIPITOR) tablet 20 mg, 20 mg, Oral, Daily, Chundi, Vahini, MD, 20 mg at 01/31/19 1001   diclofenac sodium (VOLTAREN) 1 % transdermal gel 2 g, 2 g, Topical, QID, Ladona Horns, MD, 2 g at 01/31/19 1004   enoxaparin (LOVENOX) injection 60 mg, 60 mg, Subcutaneous, Daily, Gilles Chiquito B, MD, 60 mg at 01/31/19 1001   escitalopram (LEXAPRO) tablet 20 mg, 20 mg, Oral, Daily, Chundi, Vahini, MD, 20 mg at 01/31/19 1001   fluticasone (FLONASE) 50 MCG/ACT nasal spray 1 spray, 1 spray, Each Nare, Daily, Katherine Roan, MD, 1 spray at 01/31/19 1002   [COMPLETED] ipratropium-albuterol (DUONEB) 0.5-2.5 (3) MG/3ML nebulizer solution 3 mL, 3 mL, Nebulization, Once, 3 mL at 01/28/19 0807 **FOLLOWED BY** ipratropium-albuterol (DUONEB)  0.5-2.5 (3) MG/3ML nebulizer solution 3 mL, 3 mL, Nebulization, Q8H PRN, Katherine Roan, MD, 3 mL at 01/29/19 0620   oxyCODONE-acetaminophen (PERCOCET/ROXICET) 5-325 MG per tablet 1 tablet, 1 tablet, Oral, BID PRN, Ladona Horns, MD, 1 tablet at 01/28/19 2500   penicillin G potassium 12 Million Units in dextrose 5 % 500 mL continuous infusion, 12 Million Units, Intravenous, Q12H, Andreea Arca, Evern Core, MD   potassium chloride SA (K-DUR) CR tablet 20 mEq, 20 mEq, Oral, BID, Madalyn Rob, MD, 20 mEq at 01/31/19 1001   promethazine (PHENERGAN) tablet 12.5 mg, 12.5 mg, Oral, Q6H PRN, Chundi, Vahini, MD   rifampin (RIFADIN) capsule 300 mg, 300 mg, Oral, Q8H, Shatasia Cutshaw, Evern Core, MD, 300 mg at 01/31/19 1001   senna-docusate (Senokot-S) tablet 1 tablet, 1 tablet, Oral, QHS PRN, Lars Mage, MD   Physical Exam:   Vitals:   01/31/19 0501 01/31/19 1011  BP: (!) 153/74 (!) 148/83  Pulse:  68  Resp:    Temp:    SpO2: 94%    Gen: pleasant, chronically ill/lethargic, obese, A&Ox 3 Head: NCAT, no temporal wasting evident EENT: PERRL, EOMI, MMM, adequate dentition Neck: supple, no JVD CV: NRRR, I/VI diastolic murmur heard loudest along his LUSB Pulm: CTA bilaterally, no wheeze or retractions Abd: soft, NTND, +BS Extrems: 1+ LT LE edema, 2+ pulses, well-healed incision to his LT knee Skin: ruptured hemorrhagic bullae noted to anterior distal LT leg with improving surrounding erythema, adequate skin turgor Neuro: CN II-XII grossly intact, no focal neurologic deficits appreciated,  gait was not assessed, A&Ox 3   Review of Systems:  Review of Systems  Constitutional: Negative for chills, fever and weight loss.  HENT: Negative for congestion, hearing loss, sinus pain and sore throat.   Eyes: Negative for blurred vision, photophobia and discharge.  Respiratory: Negative for cough, hemoptysis and shortness of breath.   Cardiovascular: Negative for chest pain, palpitations, orthopnea and leg swelling.    Gastrointestinal: Negative for abdominal pain, constipation, diarrhea, heartburn, nausea and vomiting.  Genitourinary: Negative for dysuria, flank pain, frequency and urgency.  Musculoskeletal: Negative for back pain, joint pain and myalgias.  Skin: Positive for rash. Negative for itching.       LT distal leg cellulitis  Neurological: Positive for weakness. Negative for tremors, seizures and headaches.  Endo/Heme/Allergies: Negative for polydipsia. Does not bruise/bleed easily.  Psychiatric/Behavioral: Negative for depression and substance abuse. The patient is not nervous/anxious and does not have insomnia.      Lab Results  Component Value Date   WBC 12.1 (H) 01/31/2019   HGB 12.5 (L) 01/31/2019   HCT 37.5 (L) 01/31/2019   MCV 90.6 01/31/2019   PLT 366 01/31/2019    Lab Results  Component Value Date   CREATININE 1.09 01/31/2019   BUN 19 01/31/2019   NA 139 01/31/2019   K 3.5 01/31/2019   CL 107 01/31/2019   CO2 21 (L) 01/31/2019    Lab Results  Component Value Date   ALT 106 (H) 01/31/2019   AST 75 (H) 01/31/2019   ALKPHOS 64 01/31/2019     Microbiology: Recent Results (from the past 240 hour(s))  Blood Culture (routine x 2)     Status: Abnormal   Collection Time: 01/25/19  7:28 PM   Specimen: BLOOD RIGHT FOREARM  Result Value Ref Range Status   Specimen Description BLOOD RIGHT FOREARM  Final   Special Requests   Final    BOTTLES DRAWN AEROBIC AND ANAEROBIC Blood Culture results may not be optimal due to an inadequate volume of blood received in culture bottles   Culture  Setup Time   Final    GRAM POSITIVE COCCI IN CHAINS IN BOTH AEROBIC AND ANAEROBIC BOTTLES CRITICAL RESULT CALLED TO, READ BACK BY AND VERIFIED WITH: Karsten Ro PHARMD, AT 6144 01/26/19 BY Rush Landmark Performed at [facility], [place] [address] [place], [place] [address]    Culture STREPTOCOCCUS GROUP G (A)  Final   Report Status 01/28/2019 FINAL  Final   Organism ID, Bacteria STREPTOCOCCUS  GROUP G  Final      Susceptibility   Streptococcus group g - MIC*    CLINDAMYCIN >=1 RESISTANT Resistant     AMPICILLIN <=0.25 SENSITIVE Sensitive     ERYTHROMYCIN >=8 RESISTANT Resistant     VANCOMYCIN 0.5 SENSITIVE Sensitive     CEFTRIAXONE <=0.12 SENSITIVE Sensitive     LEVOFLOXACIN 0.5 SENSITIVE Sensitive     PENICILLIN Value in next row Sensitive      SENSITIVE<=0.06    * STREPTOCOCCUS GROUP G  Blood Culture ID Panel (Reflexed)     Status: Abnormal   Collection Time: 01/25/19  7:28 PM  Result Value Ref Range Status   Enterococcus species NOT DETECTED NOT DETECTED Final   Listeria monocytogenes NOT DETECTED NOT DETECTED Final   Staphylococcus species NOT DETECTED NOT DETECTED Final   Staphylococcus aureus (BCID) NOT DETECTED NOT DETECTED Final   Streptococcus species DETECTED (A) NOT DETECTED Final    Comment: Not Enterococcus species, Streptococcus agalactiae, Streptococcus pyogenes, or  Streptococcus pneumoniae. CRITICAL RESULT CALLED TO, READ BACK BY AND VERIFIED WITH: J. LEDFORD PHARMD AT 0704 01/26/19 BY D. VANHOOK    Streptococcus agalactiae NOT DETECTED NOT DETECTED Final   Streptococcus pneumoniae NOT DETECTED NOT DETECTED Final   Streptococcus pyogenes NOT DETECTED NOT DETECTED Final   Acinetobacter baumannii NOT DETECTED NOT DETECTED Final   Enterobacteriaceae species NOT DETECTED NOT DETECTED Final   Enterobacter cloacae complex NOT DETECTED NOT DETECTED Final   Escherichia coli NOT DETECTED NOT DETECTED Final   Klebsiella oxytoca NOT DETECTED NOT DETECTED Final   Klebsiella pneumoniae NOT DETECTED NOT DETECTED Final   Proteus species NOT DETECTED NOT DETECTED Final   Serratia marcescens NOT DETECTED NOT DETECTED Final   Haemophilus influenzae NOT DETECTED NOT DETECTED Final   Neisseria meningitidis NOT DETECTED NOT DETECTED Final   Pseudomonas aeruginosa NOT DETECTED NOT DETECTED Final   Candida albicans NOT DETECTED NOT DETECTED Final   Candida glabrata NOT  DETECTED NOT DETECTED Final   Candida krusei NOT DETECTED NOT DETECTED Final   Candida parapsilosis NOT DETECTED NOT DETECTED Final   Candida tropicalis NOT DETECTED NOT DETECTED Final    Comment: Performed at [facility], [place]. [address] [place], [place] [address]  Blood Culture (routine x 2)     Status: Abnormal   Collection Time: 01/25/19  7:30 PM   Specimen: BLOOD LEFT HAND  Result Value Ref Range Status   Specimen Description BLOOD LEFT HAND  Final   Special Requests   Final    BOTTLES DRAWN AEROBIC AND ANAEROBIC Blood Culture results may not be optimal due to an inadequate volume of blood received in culture bottles   Culture  Setup Time   Final    GRAM POSITIVE COCCI IN CHAINS IN BOTH AEROBIC AND ANAEROBIC BOTTLES CRITICAL VALUE NOTED.  VALUE IS CONSISTENT WITH PREVIOUSLY REPORTED AND CALLED VALUE.    Culture (A)  Final    STREPTOCOCCUS GROUP G SUSCEPTIBILITIES PERFORMED ON PREVIOUS CULTURE WITHIN THE LAST 5 DAYS. Performed at [facility], [place] [address], [place], [place] [address]    Report Status 01/28/2019 FINAL  Final  SARS Coronavirus 2 [facility] order, Performed in [facility] hospital lab) Nasopharyngeal Nasopharyngeal Swab     Status: None   Collection Time: 01/25/19  9:51 PM   Specimen: Nasopharyngeal Swab  Result Value Ref Range Status   SARS Coronavirus 2 NEGATIVE NEGATIVE Final    Comment: (NOTE) If result is NEGATIVE SARS-CoV-2 target nucleic acids are NOT DETECTED. The SARS-CoV-2 RNA is generally detectable in upper and lower  respiratory specimens during the acute phase of infection. The lowest  concentration of SARS-CoV-2 viral copies this assay can detect is 250  copies / mL. A negative result does not preclude SARS-CoV-2 infection  and should not be used as the sole basis for treatment or other  patient management decisions.  A negative result may occur with  improper specimen collection / handling, submission of specimen other  than  nasopharyngeal swab, presence of viral mutation(s) within the  areas targeted by this assay, and inadequate number of viral copies  (<250 copies / mL). A negative result must be combined with clinical  observations, patient history, and epidemiological information. If result is POSITIVE SARS-CoV-2 target nucleic acids are DETECTED. The SARS-CoV-2 RNA is generally detectable in upper and lower  respiratory specimens dur ing the acute phase of infection.  Positive  results are indicative of active infection with SARS-CoV-2.  Clinical  correlation with  patient history and other diagnostic information is  necessary to determine patient infection status.  Positive results do  not rule out bacterial infection or co-infection with other viruses. If result is PRESUMPTIVE POSTIVE SARS-CoV-2 nucleic acids MAY BE PRESENT.   A presumptive positive result was obtained on the submitted specimen  and confirmed on repeat testing.  While 2019 novel coronavirus  (SARS-CoV-2) nucleic acids may be present in the submitted sample  additional confirmatory testing may be necessary for epidemiological  and / or clinical management purposes  to differentiate between  SARS-CoV-2 and other Sarbecovirus currently known to infect humans.  If clinically indicated additional testing with an alternate test  methodology [phone]) is advised. The SARS-CoV-2 RNA is generally  detectable in upper and lower respiratory sp ecimens during the acute  phase of infection. The expected result is Negative. Fact Sheet for Patients:  StrictlyIdeas.no Fact Sheet for Healthcare Providers: BankingDealers.co.za This test is not yet approved or cleared by the [place] FDA and has been authorized for detection and/or diagnosis of SARS-CoV-2 by FDA under an Emergency Use Authorization (EUA).  This EUA will remain in effect (meaning this test can be used) for the duration of  the COVID-19 declaration under Section 564(b)(1) of the Act, 21 U.S.C. section 360bbb-3(b)(1), unless the authorization is terminated or revoked sooner. Performed at [facility], [place] [address] [place], [place] [address]   Urine culture     Status: None   Collection Time: 01/26/19  1:07 AM   Specimen: In/Out Cath Urine  Result Value Ref Range Status   Specimen Description IN/OUT CATH URINE  Final   Special Requests NONE  Final   Culture   Final    NO GROWTH Performed at [facility], [place] [address] [place], [place] [address]    Report Status 01/27/2019 FINAL  Final  Culture, blood (routine x 2)     Status: None (Preliminary result)   Collection Time: 01/28/19 10:58 AM   Specimen: BLOOD RIGHT HAND  Result Value Ref Range Status   Specimen Description BLOOD RIGHT HAND  Final   Special Requests AEROBIC BOTTLE ONLY Blood Culture adequate volume  Final   Culture   Final    NO GROWTH 2 DAYS Performed at [facility], [place] [address], [place], [place] [address]    Report Status PENDING  Incomplete  Culture, blood (routine x 2)     Status: None (Preliminary result)   Collection Time: 01/28/19 11:04 AM   Specimen: BLOOD LEFT HAND  Result Value Ref Range Status   Specimen Description BLOOD LEFT HAND  Final   Special Requests AEROBIC BOTTLE ONLY Blood Culture adequate volume  Final   Culture   Final    NO GROWTH 2 DAYS Performed at [facility], [place] [address] [place], [place] [address]    Report Status PENDING  Incomplete   OPAT ORDERS:  Diagnosis: prosthetic aortic valve endocarditis  Culture Result: Group A Streptococcus  Allergies  Allergen Reactions   Amiodarone Rash   Amlodipine Rash    Discharge antibiotics: Per pharmacy protocol IV PCN 24 million units/day and PO rifampin 300 mg TID Duration:  6 weeks  End Date: 03/07/2019  [facility] Care and Maintenance Per Protocol __ Please pull PIC at completion of IV antibiotics _X_ Please leave  PIC in place until doctor has seen patient or been notified  Labs weekly while on IV antibiotics: _X_ CBC with differential __ BMP __ BMP TWICE  WEEKLY** _X_ CMP __ CRP __ ESR __ Vancomycin trough  Fax weekly labs to [phone]  Clinic Follow Up Appt: Tuesday September 29th at 9:30 AM @ [facility] with Dr. Prince Rome  Impression/Plan: The patient is a 68 y/o WM with h/o AoVR in 2008 admitted with fever, ARF, LT leg cellulitis and Group G Streptococcal sepsis.  1. Group G Strep sepsis/PVIE - Both of his initial blood cultures have been positive for group G streptococcus with his most likely source being his left leg cellulitis.  While his transthoracic echocardiogram was mostly unrevealing, f/u transesophageal echocardiogram showed a preserved ejection fraction but mild aortic stenosis with a small mobile vegetation noted to his bioprosthetic aortic valve.  He has a low MIC to penicillin on his isolate sensitivities of less than 0.06, so we will convert his ampicillin to aqueous penicillin as this will allow for more stability to the ABX and ease of dosing as he then may use a daily pressurized elastomeric device rather than requiring an ambulatory pump for administration of his med.  Given concern for PVIE, have also added rifampin 300 mg p.o. TID as an adjunctive measure.  Given the high likelihood of failure of medical monotherapy with prosthetic valve endocarditis despite optimized antibiotics, I appreciate CT surgery evaluating this patient.  As intervention is being deferred at this time, it would still be advised that he follow with CTS as well as our service as his risk for decline and development of subsequent CHF remains a high probability while he remains on treatment.  Would anticipate a 6-week course of IV PCN and PO rifampin combination therapy at this time, which would be definitive duration for medical montherapy.  Tentative ABX d/c date is 03/07/2019. Repeat blood cultures remain negative  at > 48 hrs, so may place PICC line when ready. I had our ID clinical pharmacists meet with him to discuss his ABX, potential SEs, and drug-drug interactions. > 20 minutes of time spent on d/c planning alone today.  2. LT leg cellulitis -this is the most likely source of the patient's group B strep bacteremia and now subsequently confirmed prosthetic valve endocarditis.  We will adjust antibiotics as noted above.  The patient reports no pain with efforts to ambulate to his left knee at this time.  He has had hemorrhagic conversion of the bullae noted as a component of his cellulitis.  This clinical evolution is to a certain degree expected with streptococcal cellulitis.  I have emphasized with the patient the need for him to elevate his left leg to avoid further complications such as soft tissue abscesses, especially given his recent rebound leukocytosis.  3. Fever -patient's T-max on the day of admission on August 27 was 104.1.  With empiric antibiotics he has had steady decline in his T-max daily and has now been afebrile for the last several days.  Follow-up repeat blood cultures as noted above.  Repeat any blood cultures for fevers greater than 100.5.  4. ARF - Peak Cr was 1.96 3 days ago. Cr has now returned to WNL at 1.09 today.  Dosing of both antibiotics will be for creatinine clearance of greater than 60 at this time.

## 2019-01-31 NOTE — Progress Notes (Addendum)
PHARMACY CONSULT NOTE FOR:  OUTPATIENT  PARENTERAL ANTIBIOTIC THERAPY (OPAT)  Indication: Streptococcal endocarditis  Regimen: Penicillin G 24 million units IV daily as a continuous infusion and rifampin 300mg  by mouth every 8 hours  End date: 03/07/2019  IV antibiotic discharge orders are pended. To discharging provider:  please sign these orders via discharge navigator,  Select New Orders & click on the button choice - Manage This Unsigned Work.     Thank you for allowing pharmacy to be a part of this patient's care.  Phillis Haggis 01/31/2019, 10:26 AM

## 2019-01-31 NOTE — TOC Initial Note (Signed)
Transition of Care [facility]) - Initial/Assessment Note    Patient Details  Name: [name] [name] MRN: [id number] Date of Birth: 08-17-1950  Transition of Care [facility]) CM/SW Contact:    Zenon Mayo, RN Phone Number: 01/31/2019, 5:11 PM  Clinical Narrative:                 Patient will need HHRN for home iv abx.  No other [place] agency was able to take patient due to staffing, Carolynn Sayers assisted and [facility] was able to take patient. [phone], fax [phone]. Patient will not be getting HHPT at this time since iv abx will take priority.  Expected Discharge Plan: [place] Barriers to Discharge: No Barriers Identified   Patient Goals and CMS Choice Patient states their goals for this hospitalization and ongoing recovery are:: to get better CMS Medicare.gov Compare Post Acute Care list provided to:: Patient Choice offered to / list presented to : Patient  Expected Discharge Plan and Services Expected Discharge Plan: [place] In-house Referral: NA Discharge Planning Services: CM Consult Post Acute Care Choice: [place] arrangements for the past 2 months: Single Family Home                 DME Arranged: (NA)         HH Arranged: RN [place] Agency: Other - See comment([facility]) Date HH Agency Contacted: 01/31/19 Time HH Agency Contacted: 16 Representative spoke with at [place]: Carolynn Sayers  Prior Living Arrangements/Services Living arrangements for the past 2 months: Single Family Home Lives with:: Spouse Patient language and need for interpreter reviewed:: Yes Do you feel safe going back to the place where you live?: Yes      Need for Family Participation in Patient Care: Yes (Comment) Care giver support system in place?: Yes (comment)   Criminal Activity/Legal Involvement Pertinent to Current Situation/Hospitalization: No - Comment as needed  Activities of Daily Living Home Assistive  Devices/Equipment: Walker (specify type), Cane (specify quad or straight) ADL Screening (condition at time of admission) Patient's cognitive ability adequate to safely complete daily activities?: Yes Is the patient deaf or have difficulty hearing?: No Does the patient have difficulty seeing, even when wearing glasses/contacts?: No Does the patient have difficulty concentrating, remembering, or making decisions?: No Patient able to express need for assistance with ADLs?: Yes Does the patient have difficulty dressing or bathing?: No Independently performs ADLs?: Yes (appropriate for developmental age) Does the patient have difficulty walking or climbing stairs?: Yes Weakness of Legs: Left Weakness of Arms/Hands: None  Permission Sought/Granted                  Emotional Assessment Appearance:: Appears stated age Attitude/Demeanor/Rapport: Gracious Affect (typically observed): Appropriate Orientation: : Oriented to Self, Oriented to Place, Oriented to  Time, Oriented to Situation Alcohol / Substance Use: Not Applicable Psych Involvement: No (comment)  Admission diagnosis:  AKI (acute kidney injury) ([place]) [N17.9] Left leg cellulitis [id number] Sepsis, due to unspecified organism, unspecified whether acute organ dysfunction present [facility]) [A41.9] Patient Active Problem List   Diagnosis Date Noted  . Sepsis ([place])   . Pressure injury of skin 01/29/2019  . Streptococcal bacteremia 01/29/2019  . S/P total knee arthroplasty, left 01/29/2019  . Hyponatremia 01/26/2019  . Cellulitis of leg without foot, left 01/26/2019  . AKI (acute kidney injury) ([place]) 01/26/2019  . OA (osteoarthritis) of knee 03/20/2018  . Osteoarthritis of knee  06/17/2017  . DOE (dyspnea on exertion) 08/16/2016  . PAF (paroxysmal atrial fibrillation) ([place]) 08/15/2016  . Hx of maze procedure 03/28/2014  . BMI 38.0-38.9,adult 11/03/2011  . S/P ascending aortic aneurysm repair 04/19/2007  . S/P aortic valve  replacement with bioprosthetic valve 04/19/2007  . S/P Maze operation for atrial fibrillation 04/19/2007  . HYPERLIPIDEMIA, MIXED 04/06/2007  . Essential hypertension 04/06/2007  . HERNIATED LUMBAR DISC 04/06/2007  . Sleep apnea 04/06/2007   PCP:  Mancel Bale, PA-C Pharmacy:   [facility], [place] [address] Phone: [phone] Fax: [phone]  [facility] [place], [place] [address] Phone: [phone] Fax: [phone]  [facility] [place], [place] - [address] [place] [address] Phone: [phone] Fax: [phone]     Social Determinants of Health (SDOH) Interventions    Readmission Risk Interventions No flowsheet data found.

## 2019-01-31 NOTE — TOC Progression Note (Signed)
Transition of Care [facility]) - Progression Note    Patient Details  Name: [name] [name] MRN: [id number] Date of Birth: [birth year]  [facility]) CM/SW Contact  Zenon Mayo, RN Phone Number: 01/31/2019, 7:26 AM  Clinical Narrative:    From home with spouse, pt eval rec HH , TOC team will continue to follow for TOC needs.        Expected Discharge Plan and Services                                                 Social Determinants of Health (SDOH) Interventions    Readmission Risk Interventions No flowsheet data found.

## 2019-01-31 NOTE — TOC Initial Note (Signed)
Transition of Care [facility]) - Initial/Assessment Note    Patient Details  Name: [name] [name] MRN: [id number] Date of Birth: 04/05/1951  Transition of Care [facility]) CM/SW Contact:    Zenon Mayo, RN Phone Number: 01/31/2019, 10:17 AM  Clinical Narrative:                 From home with spouse, will need HHRN for iv abx and HHPT, NCM offered choice, he chose [facility], referral given to [facility].  She can take referral.   Expected Discharge Plan: [place] Barriers to Discharge: No Barriers Identified   Patient Goals and CMS Choice Patient states their goals for this hospitalization and ongoing recovery are:: get better CMS Medicare.gov Compare Post Acute Care list provided to:: Patient Choice offered to / list presented to : Patient  Expected Discharge Plan and Services Expected Discharge Plan: [place] In-house Referral: NA Discharge Planning Services: CM Consult Post Acute Care Choice: [place] arrangements for the past 2 months: Single Family Home                 DME Arranged: (NA)         HH Arranged: RN, PT [place] Agency: [place] ([place]) Date HH Agency Contacted: 01/31/19 Time [place]: 1016 Representative spoke with at [place]: [place] Arrangements/Services Living arrangements for the past 2 months: [place] with:: Spouse Patient language and need for interpreter reviewed:: Yes Do you feel safe going back to the place where you live?: Yes      Need for Family Participation in Patient Care: Yes (Comment) Care giver support system in place?: Yes (comment)   Criminal Activity/Legal Involvement Pertinent to Current Situation/Hospitalization: No - Comment as needed  Activities of Daily Living Home Assistive Devices/Equipment: Environmental consultant (specify type), Cane (specify quad or straight) ADL Screening (condition at time of admission) Patient's cognitive ability adequate to  safely complete daily activities?: Yes Is the patient deaf or have difficulty hearing?: No Does the patient have difficulty seeing, even when wearing glasses/contacts?: No Does the patient have difficulty concentrating, remembering, or making decisions?: No Patient able to express need for assistance with ADLs?: Yes Does the patient have difficulty dressing or bathing?: No Independently performs ADLs?: Yes (appropriate for developmental age) Does the patient have difficulty walking or climbing stairs?: Yes Weakness of Legs: Left Weakness of Arms/Hands: None  Permission Sought/Granted                  Emotional Assessment Appearance:: Appears stated age Attitude/Demeanor/Rapport: Engaged Affect (typically observed): Appropriate Orientation: : Oriented to Self, Oriented to Place, Oriented to  Time, Oriented to Situation Alcohol / Substance Use: Not Applicable Psych Involvement: No (comment)  Admission diagnosis:  AKI (acute kidney injury) ([place]) [N17.9] Left leg cellulitis [id number] Sepsis, due to unspecified organism, unspecified whether acute organ dysfunction present [facility]) [A41.9] Patient Active Problem List   Diagnosis Date Noted  . Sepsis ([place])   . Pressure injury of skin 01/29/2019  . Streptococcal bacteremia 01/29/2019  . S/P total knee arthroplasty, left 01/29/2019  . Hyponatremia 01/26/2019  . Cellulitis of leg without foot, left 01/26/2019  . AKI (acute kidney injury) ([place]) 01/26/2019  . OA (osteoarthritis) of knee 03/20/2018  . Osteoarthritis of knee 06/17/2017  . DOE (dyspnea on exertion) 08/16/2016  . PAF (paroxysmal atrial fibrillation) ([place]) 08/15/2016  . Hx of maze procedure 03/28/2014  . BMI 38.0-38.9,adult 11/03/2011  .  S/P ascending aortic aneurysm repair 04/19/2007  . S/P aortic valve replacement with bioprosthetic valve 04/19/2007  . S/P Maze operation for atrial fibrillation 04/19/2007  . HYPERLIPIDEMIA, MIXED 04/06/2007  . Essential hypertension  04/06/2007  . HERNIATED LUMBAR DISC 04/06/2007  . Sleep apnea 04/06/2007   PCP:  Mancel Bale, PA-C Pharmacy:   [place], [place] [address] Phone: [phone] Fax: [phone]  [facility] ([place]) [place], [place] [address] Phone: [phone] Fax: [phone]  [facility] [place], [place] - [address] [place] [address] Phone: [phone] Fax: [phone]     Social Determinants of Health (SDOH) Interventions    Readmission Risk Interventions No flowsheet data found.

## 2019-01-31 NOTE — Progress Notes (Signed)
Left lower extremity venous duplex completed. Preliminary review in Chart review CV Proc. [facility], [place] 01/31/2019, 3:06 PM

## 2019-01-31 NOTE — Progress Notes (Signed)
RT placed patient on CPAP HS. No O2 bleed in needed. Patient tolerated well.

## 2019-01-31 NOTE — Progress Notes (Signed)
   Subjective:  Patient reports that he was doing well today, he reports that with the CPAP he slept well. He denied any fevers, chills, nausea, vomiting, or other issues. He reports that his leg was doing better today.   Objective:  Vital signs in last 24 hours: Vitals:   01/30/19 1931 01/30/19 2318 01/31/19 0346 01/31/19 0501  BP: (!) 169/87 135/74 (!) 175/81 (!) 153/74  Pulse: (!) 108 63 62   Resp:  (!) 22 18   Temp: 98.5 F (36.9 C) 97.8 F (36.6 C) 98.1 F (36.7 C)   TempSrc: Oral Oral Oral   SpO2: 92% 95% 92% 94%  Weight:   121.2 kg   Height:        General: Elderly male, no acute distress, sitting in bed Cardiac: Systolic murmur loudest in right second intercostal space, regular rate and rhythm Pulmonary: wheezing throughout lungs, normal work of breathing Abdomen: Soft, nontender, nondistended, normoactive bowel sounds Extremity: Left lower extremity has area of erythema that I improving with darkened discolored purpura and some peeling    Assessment/Plan:  Principal Problem:   Streptococcal bacteremia Active Problems:   Essential hypertension   Sleep apnea   BMI 38.0-38.9,adult   S/P aortic valve replacement with bioprosthetic valve   Hyponatremia   Cellulitis of leg without foot, left   AKI (acute kidney injury) (HCC)   Pressure injury of skin   S/P total knee arthroplasty, left   Sepsis ([place])  This is a 68 year old male with history of bicuspid aortic valve status post aortic valve replacement in 2008, ascending aortic aneurysm status post repair 2008, left knee replacement 2019, hypertension, hyperlipidemia, and OSA presented with weakness and fatigue and was noted to have LLE cellulitis and group G strep bacteremia.   Group G Strep bacteremia Blood cultures from 8/27 grew group G strep.  Repeat blood cultures 830 NGTD. TEE on 9/1 showed small mobile  density on bioprosthetic aortic valve. Appreciate CTS recommendations. Patient was on broad spectrum  antibiotics for 4 days. This is day 4 of ampicillin and day 2 of Rifampin. 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 No surgical intervention at this time. Repete echo after antibiotic course.  - ID following appreciate recommendations - appreciate CTS recommendations - Follow blood cultures - lower extremity venous duplex scan - Continue ampcillin ( day 4) and Rifampin (day 2) x 6 weeks, Tentative ABX d/c date 03/07/2019 - PICC line  Left lower extremity cellulitis: Erythema looks like it is improving.  Patient remains afebrile and denies fevers or chills overnight.  - Treatment as above  AKI Admission creatinine 1.79, due to dehydration versus rhabdomyolysis.  CK down trended Improved with IV fluids.  Creatinine 1.09 on 9/2. -Monitor BMP  Congestion Wheezing on exam. No cough. On antibiotics, making pneumonia unlikely. Patient former smoker, on CPAP at night.  - duonebs q8 PRN - Fluticasone 1 spray in each nare daily  Hypokalemia: K 3.5 today  Hypertension: Systolic 0000000. Home meds being held, amlodipine 10 mg and metoprolol 25 mg initially.  - restart amlodipine 10mg   Hyperlipidemia: Continue home atorvastatin 20 mg daily  Anxiety: Continue home Xanax 0.5 mg daily as needed and Lexapro 20 mg daily  FEN: No fluids, replete lytes prn, HH diet VTE ppx: Lovenox  Code Status: FULL  Dispo: Anticipated discharge 0-1 days pending PICC placement.   Tamsen Snider, MD PGY1  [phone]

## 2019-01-31 NOTE — Progress Notes (Signed)
Peripherally Inserted Central Catheter/Midline Placement  The IV Nurse has discussed with the patient and/or persons authorized to consent for the patient, the purpose of this procedure and the potential benefits and risks involved with this procedure.  The benefits include less needle sticks, lab draws from the catheter, and the patient may be discharged home with the catheter. Risks include, but not limited to, infection, bleeding, blood clot (thrombus formation), and puncture of an artery; nerve damage and irregular heartbeat and possibility to perform a PICC exchange if needed/ordered by physician.  Alternatives to this procedure were also discussed.  Bard Power PICC patient education guide, fact sheet on infection prevention and patient information card has been provided to patient /or left at bedside.    PICC/Midline Placement Documentation  PICC Single Lumen [id number] PICC Right Basilic 45 cm 0 cm (Active)  Indication for Insertion or Continuance of Line Home intravenous therapies (PICC only) 01/31/19 1600  Exposed Catheter (cm) 0 cm 01/31/19 1600  Site Assessment Clean;Dry;Intact 01/31/19 1600  Line Status Flushed;Saline locked;Blood return noted 01/31/19 1600  Dressing Type Transparent;Securing device 01/31/19 1600  Dressing Status Clean;Dry;Intact;Antimicrobial disc in place 01/31/19 1600  Steward checked and tightened 01/31/19 1600  Dressing Intervention New dressing 01/31/19 1600  Dressing Change Due 02/07/19 01/31/19 1600       Virgilio Belling 01/31/2019, 4:26 PM

## 2019-02-01 ENCOUNTER — Inpatient Hospital Stay (HOSPITAL_COMMUNITY): Payer: BC Managed Care – PPO

## 2019-02-01 DIAGNOSIS — Z96652 Presence of left artificial knee joint: Secondary | ICD-10-CM | POA: Diagnosis not present

## 2019-02-01 DIAGNOSIS — B955 Unspecified streptococcus as the cause of diseases classified elsewhere: Secondary | ICD-10-CM | POA: Diagnosis not present

## 2019-02-01 DIAGNOSIS — A419 Sepsis, unspecified organism: Secondary | ICD-10-CM | POA: Diagnosis not present

## 2019-02-01 DIAGNOSIS — L03116 Cellulitis of left lower limb: Secondary | ICD-10-CM | POA: Diagnosis not present

## 2019-02-01 LAB — CBC
HCT: 37.2 % — ABNORMAL LOW (ref 39.0–52.0)
Hemoglobin: 12.2 g/dL — ABNORMAL LOW (ref 13.0–17.0)
MCH: 30.3 pg (ref 26.0–34.0)
MCHC: 32.8 g/dL (ref 30.0–36.0)
MCV: 92.3 fL (ref 80.0–100.0)
Platelets: 403 10*3/uL — ABNORMAL HIGH (ref 150–400)
RBC: 4.03 MIL/uL — ABNORMAL LOW (ref 4.22–5.81)
RDW: 14.3 % (ref 11.5–15.5)
WBC: 11.5 10*3/uL — ABNORMAL HIGH (ref 4.0–10.5)
nRBC: 0 % (ref 0.0–0.2)

## 2019-02-01 LAB — BASIC METABOLIC PANEL
Anion gap: 10 (ref 5–15)
BUN: 14 mg/dL (ref 8–23)
CO2: 24 mmol/L (ref 22–32)
Calcium: 7.9 mg/dL — ABNORMAL LOW (ref 8.9–10.3)
Chloride: 103 mmol/L (ref 98–111)
Creatinine, Ser: 1.12 mg/dL (ref 0.61–1.24)
GFR calc Af Amer: >60 mL/min (ref >60)
GFR calc non Af Amer: >60 mL/min (ref >60)
Glucose, Bld: 125 mg/dL — ABNORMAL HIGH (ref 70–99)
Potassium: 3.3 mmol/L — ABNORMAL LOW (ref 3.5–5.1)
Sodium: 137 mmol/L (ref 135–145)

## 2019-02-01 MED ORDER — SODIUM CHLORIDE 0.9% FLUSH: 10.0000 mL | INTRAVENOUS | Status: DC | PRN

## 2019-02-01 MED ORDER — PENICILLIN G POTASSIUM IV (FOR PTA / DISCHARGE USE ONLY): 24.0000 10*6.[IU] | INTRAVENOUS | 0 refills | Status: AC

## 2019-02-01 MED ORDER — POTASSIUM CHLORIDE CRYS ER 20 MEQ PO TBCR
40.0000 meq | EXTENDED_RELEASE_TABLET | Freq: Two times a day (BID) | ORAL | Status: DC
  Administered 2019-02-01: 40 meq via ORAL
  Filled 2019-02-01: qty 2

## 2019-02-01 NOTE — Progress Notes (Signed)
Pt states he can place himself on CPAP for the night. Pt machine is set on his home setting of 4 cmH2O with no oxygen bled in. RT will continue to monitor.

## 2019-02-01 NOTE — TOC Transition Note (Addendum)
Transition of Care [facility]) - CM/SW Discharge Note   Patient Details  Name: [name] [name] MRN: [id number] Date of Birth: [birth year]  Transition of Care [facility]) CM/SW Contact:  Zenon Mayo, RN Phone Number: 02/01/2019, 1:45 PM   Clinical Narrative:    Patient for dc today, he is set up with [facility] infusion, NCM spoke with Mendel Ryder and she states a Therapist, sports will be at his home tomorrow at 1 pm.  Ethelda Chick is in with patient now doing teaching with the medication.  He has no other needs at this time.   9/4/- NCM made referral to [facility] for HHPT, awaiting a call back. Per Hoyle Sauer she states they are processing this now and they should be able to take him for HHPT.  NCM informed Pam and she will inform the team.   Final next level of care: [place] Barriers to Discharge: No Barriers Identified   Patient Goals and CMS Choice Patient states their goals for this hospitalization and ongoing recovery are:: to go home CMS Medicare.gov Compare Post Acute Care list provided to:: Patient Choice offered to / list presented to : Patient  Discharge Placement                       Discharge Plan and Services In-house Referral: NA Discharge Planning Services: CM Consult Post Acute Care Choice: Home Health          DME Arranged: (NA)         HH Arranged: RN [place] Agency: Other - See comment([facility]) Date HH Agency Contacted: 01/31/19 Time HH Agency Contacted: 1600 Representative spoke with at [place]: [place] spoke with her  Social Determinants of Health (SDOH) Interventions     Readmission Risk Interventions No flowsheet data found.

## 2019-02-01 NOTE — Progress Notes (Signed)
Occupational Therapy Treatment Patient Details Name: [name] [name] MRN: [id number] DOB: 05/29/1951 Today's Date: 02/01/2019    History of present illness Pt is a 68 y/o male admitted secondary to fever, chills, and LLE cellulitis. CT of head negative for acute abnormality. PMH includes HTN, s/p AVR, OSA, and aortic aneurysm s/p repair.    OT comments  Pt performed toileting, standing grooming and LB dressing with supervision and use of RW. Educated in fall prevention for home.   Follow Up Recommendations  No OT follow up(not covered by insurance)    Equipment Recommendations  None recommended by OT    Recommendations for Other Services      Precautions / Restrictions Precautions Precautions: Fall       Mobility Bed Mobility Overal bed mobility: Modified Independent             General bed mobility comments: increased effort and pain, but no assist to return to supine  Transfers Overall transfer level: Needs assistance Equipment used: Rolling walker (2 wheeled) Transfers: Sit to/from Stand Sit to Stand: Supervision         General transfer comment: from [place] Overall balance assessment: Needs assistance   Sitting balance-Leahy Scale: Good       Standing balance-Leahy Scale: Poor Standing balance comment: can release walker in static standing for pericare                           ADL either performed or assessed with clinical judgement   ADL Overall ADL's : Needs assistance/impaired     Grooming: Set up;Sitting;Wash/dry hands               Lower Body Dressing: Sitting/lateral leans;Set up   Toilet Transfer: Supervision/safety;Ambulation;BSC;RW   Toileting- Clothing Manipulation and Hygiene: Supervision/safety;Sit to/from stand       Functional mobility during ADLs: Supervision/safety;Rolling walker       Vision       Perception     Praxis      Cognition Arousal/Alertness: Awake/alert Behavior During Therapy:  WFL for tasks assessed/performed Overall Cognitive Status: Within Functional Limits for tasks assessed                                          Exercises     Shoulder Instructions       General Comments      Pertinent Vitals/ Pain       Pain Assessment: Faces Faces Pain Scale: Hurts even more Pain Location: LLE  Pain Descriptors / Indicators: Sore Pain Intervention(s): Monitored during session;Repositioned  Home Living                                          Prior Functioning/Environment              Frequency           Progress Toward Goals  OT Goals(current goals can now be found in the care plan section)  Progress towards OT goals: Progressing toward goals  Acute Rehab OT Goals Patient Stated Goal: to get home  OT Goal Formulation: With patient Time For Goal Achievement: 02/11/19 Potential to Achieve Goals: Good  Plan Discharge plan needs to be updated    Co-evaluation  AM-PAC OT "6 Clicks" Daily Activity     Outcome Measure   Help from another person eating meals?: None Help from another person taking care of personal grooming?: A Little Help from another person toileting, which includes using toliet, bedpan, or urinal?: A Little Help from another person bathing (including washing, rinsing, drying)?: A Little Help from another person to put on and taking off regular upper body clothing?: None Help from another person to put on and taking off regular lower body clothing?: A Little 6 Click Score: 20    End of Session Equipment Utilized During Treatment: Rolling walker;Gait belt  OT Visit Diagnosis: Unsteadiness on feet (R26.81);Other abnormalities of gait and mobility (R26.89);Muscle weakness (generalized) (M62.81);Pain   Activity Tolerance Patient tolerated treatment well   Patient Left in bed;with call bell/phone within reach   Nurse Communication          Time: [id number] OT Time  Calculation (min): 24 min  Charges: OT General Charges $OT Visit: 1 Visit OT Treatments $Self Care/Home Management : 23-37 mins  Nestor Lewandowsky, OTR/L Acute Rehabilitation Services Pager: [phone] Office: [phone]   Malka So 02/01/2019, 12:50 PM

## 2019-02-01 NOTE — Progress Notes (Signed)
Subjective:   Patient denies pain in his leg but states that it is sensitive. He has required help to get off and on the bed but once he is in that position he can stand and walk with the walker. Pt reports a morning cough of a couple day duration that is non productive and gets better throughout the day. He denies previous cough like this. Counseled on use and importance of incentive spirometer. He has no other complaints or concerns.   Objective:  Vital signs in last 24 hours: Vitals:   01/31/19 1327 01/31/19 1934 02/01/19 0506 02/01/19 0823  BP: (!) 148/73 (!) 156/74 (!) 162/81   Pulse: 69 64 62   Resp: 18 (!) 21 20   Temp: 97.9 F (36.6 C) 98.8 F (37.1 C) 98.1 F (36.7 C)   TempSrc: Oral Oral Oral   SpO2: 96% 97% 97%   Weight:    118.9 kg  Height:       Physical Exam Constitutional:      Appearance: He is obese.  Cardiovascular:     Rate and Rhythm: Normal rate and regular rhythm.  Pulmonary:     Effort: No respiratory distress.     Breath sounds: No wheezing, rhonchi or rales.  Abdominal:     General: Bowel sounds are normal.     Palpations: Abdomen is soft.  Skin:    General: Skin is warm and dry.     Findings: Erythema present.     Comments: Left leg erythema improving  Neurological:     Mental Status: He is alert.        Assessment/Plan:  Principal Problem:   Streptococcal bacteremia Active Problems:   Essential hypertension   Sleep apnea   BMI 38.0-38.9,adult   S/P aortic valve replacement with bioprosthetic valve   Hyponatremia   Cellulitis of leg without foot, left   AKI (acute kidney injury) ([place])   Pressure injury of skin   S/P total knee arthroplasty, left   Sepsis [facility])  This is a 68 year old male with history of bicuspid aortic valve status post aortic valve replacement in 2008, ascending aortic aneurysm status post repair 2008, left knee replacement 2019, hypertension, hyperlipidemia, and OSA presented with weakness and fatigue and was  noted to have LLE cellulitis and group G strep bacteremia.   Group G Strep bacteremia Blood cultures from 8/27 grew group G strep.  Repeat blood cultures 8/30 NGTD at day 4. TEE on 9/1 showed small mobile  density on bioprosthetic aortic valve. Appreciate CTS recommendations. Patient was on broad spectrum antibiotics for 4 days. This is day 5 of ampicillin and day 3 of Rifampin. 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 No surgical intervention at this time. Repete echo after antibiotic course. PICC line placed and home health has been arranged. Plan to dc today. - ID following appreciate recommendations - appreciate CTS recommendations - Follow blood cultures - lower extremity venous duplex scan - Continue ampcillin ( day 5) and Rifampin (day 3) x 6 weeks, Tentative ABX d/c date 03/07/2019   Left lower extremity cellulitis: Erythema looks like it is improving.  Patient remains afebrile and denies fevers or chills overnight.  - Treatment as above  AKI Admission creatinine 1.79, due to dehydration versus rhabdomyolysis.  CK down trended Improved with IV fluids.  Creatinine 1.09 on 9/2.   Congestion Reports non productive cough in the mornings. On antibiotics, making pneumonia unlikely. Patient former smoker, on  CPAP at night. Instructed on how to use incentive spirometer. Clear to auscultation on exam. - incentive spirometer 10 times per hour - duonebs q8 PRN - Fluticasone 1 spray in each nare daily  Hypokalemia: K 3.3 today - KCl tab 40mg  BID  Hypertension: Systolic 0000000. Home meds being held, amlodipine 10 mg and metoprolol 25 mg initially.  - restart amlodipine 10mg   Hyperlipidemia: Continue home atorvastatin 20 mg daily  Anxiety: Continue home Xanax 0.5 mg daily as needed and Lexapro 20 mg daily  FEN: No fluids, replete lytes prn, HH diet VTE ppx: Lovenox  Code Status: FULL  Dispo: Anticipated  discharge with home health today.  Tamsen Snider, MD PGY1  [phone]

## 2019-02-01 NOTE — Progress Notes (Signed)
Patient alert and oriented, denies pain. Iv and tele d/c , d/c instruction explain and given to the patient. Patient d/c home per order

## 2019-02-01 NOTE — Discharge Instructions (Signed)
Mr. [name] [name] ,  It was our pleasure to take part in your care. You were treated for the following  Leg wound with cellulitis and infection found in your blood.  -You came in with fever and signs of infection. You were found to have cellulitis , a blood stream infection, and acute kidney injury. The infection was treated with antibiotics and your kidney function improved with IV fluids . You will go home and stay on antibiotics until October 7th tentalively. Home health will be coming out to help you with the antibiotics. You will need to follow up in clinic with Dr.Powers  (Infectous Disease) and Dr. Ricard Dillon ( Cardiothoracic Surgery) for the infection and to image your heart . As we spoke there was a vegetation seen on your aortic valve and this will need to be monitored for resolution with IV antibiotics. In regards to your cough this is likely to deconditioning in the hospital. Please continue to use the incentive spirometer.You will also need to follow up with your PCP, [name] [name].    My best,  Dr.Armonie Mettler

## 2019-02-02 DIAGNOSIS — B955 Unspecified streptococcus as the cause of diseases classified elsewhere: Secondary | ICD-10-CM | POA: Diagnosis not present

## 2019-02-02 DIAGNOSIS — L03116 Cellulitis of left lower limb: Secondary | ICD-10-CM | POA: Diagnosis not present

## 2019-02-02 DIAGNOSIS — Z96652 Presence of left artificial knee joint: Secondary | ICD-10-CM | POA: Diagnosis not present

## 2019-02-02 DIAGNOSIS — A419 Sepsis, unspecified organism: Secondary | ICD-10-CM | POA: Diagnosis not present

## 2019-02-02 LAB — CULTURE, BLOOD (ROUTINE X 2)
Culture: NO GROWTH
Culture: NO GROWTH
Special Requests: ADEQUATE
Special Requests: ADEQUATE

## 2019-02-03 DIAGNOSIS — A419 Sepsis, unspecified organism: Secondary | ICD-10-CM | POA: Diagnosis not present

## 2019-02-03 DIAGNOSIS — L03116 Cellulitis of left lower limb: Secondary | ICD-10-CM | POA: Diagnosis not present

## 2019-02-03 DIAGNOSIS — B955 Unspecified streptococcus as the cause of diseases classified elsewhere: Secondary | ICD-10-CM | POA: Diagnosis not present

## 2019-02-03 DIAGNOSIS — Z96652 Presence of left artificial knee joint: Secondary | ICD-10-CM | POA: Diagnosis not present

## 2019-02-04 DIAGNOSIS — B954 Other streptococcus as the cause of diseases classified elsewhere: Secondary | ICD-10-CM | POA: Diagnosis not present

## 2019-02-04 DIAGNOSIS — I10 Essential (primary) hypertension: Secondary | ICD-10-CM | POA: Diagnosis not present

## 2019-02-04 DIAGNOSIS — I330 Acute and subacute infective endocarditis: Secondary | ICD-10-CM | POA: Diagnosis not present

## 2019-02-04 DIAGNOSIS — L139 Bullous disorder, unspecified: Secondary | ICD-10-CM | POA: Diagnosis not present

## 2019-02-04 DIAGNOSIS — I4891 Unspecified atrial fibrillation: Secondary | ICD-10-CM | POA: Diagnosis not present

## 2019-02-04 DIAGNOSIS — G4733 Obstructive sleep apnea (adult) (pediatric): Secondary | ICD-10-CM | POA: Diagnosis not present

## 2019-02-04 DIAGNOSIS — F419 Anxiety disorder, unspecified: Secondary | ICD-10-CM | POA: Diagnosis not present

## 2019-02-04 DIAGNOSIS — R7881 Bacteremia: Secondary | ICD-10-CM | POA: Diagnosis not present

## 2019-02-04 DIAGNOSIS — L03116 Cellulitis of left lower limb: Secondary | ICD-10-CM | POA: Diagnosis not present

## 2019-02-05 DIAGNOSIS — T826XXA Infection and inflammatory reaction due to cardiac valve prosthesis, initial encounter: Secondary | ICD-10-CM | POA: Diagnosis not present

## 2019-02-05 DIAGNOSIS — Z96652 Presence of left artificial knee joint: Secondary | ICD-10-CM | POA: Diagnosis not present

## 2019-02-05 DIAGNOSIS — A419 Sepsis, unspecified organism: Secondary | ICD-10-CM | POA: Diagnosis not present

## 2019-02-05 DIAGNOSIS — B955 Unspecified streptococcus as the cause of diseases classified elsewhere: Secondary | ICD-10-CM | POA: Diagnosis not present

## 2019-02-05 DIAGNOSIS — L03116 Cellulitis of left lower limb: Secondary | ICD-10-CM | POA: Diagnosis not present

## 2019-02-06 DIAGNOSIS — Z09 Encounter for follow-up examination after completed treatment for conditions other than malignant neoplasm: Secondary | ICD-10-CM | POA: Diagnosis not present

## 2019-02-06 DIAGNOSIS — Z96652 Presence of left artificial knee joint: Secondary | ICD-10-CM | POA: Diagnosis not present

## 2019-02-06 DIAGNOSIS — L03116 Cellulitis of left lower limb: Secondary | ICD-10-CM | POA: Diagnosis not present

## 2019-02-06 DIAGNOSIS — B955 Unspecified streptococcus as the cause of diseases classified elsewhere: Secondary | ICD-10-CM | POA: Diagnosis not present

## 2019-02-06 DIAGNOSIS — Z953 Presence of xenogenic heart valve: Secondary | ICD-10-CM | POA: Diagnosis not present

## 2019-02-06 DIAGNOSIS — Z79899 Other long term (current) drug therapy: Secondary | ICD-10-CM | POA: Diagnosis not present

## 2019-02-06 DIAGNOSIS — A419 Sepsis, unspecified organism: Secondary | ICD-10-CM | POA: Diagnosis not present

## 2019-02-06 DIAGNOSIS — L02416 Cutaneous abscess of left lower limb: Secondary | ICD-10-CM | POA: Diagnosis not present

## 2019-02-06 DIAGNOSIS — I358 Other nonrheumatic aortic valve disorders: Secondary | ICD-10-CM | POA: Diagnosis not present

## 2019-02-06 DIAGNOSIS — I10 Essential (primary) hypertension: Secondary | ICD-10-CM | POA: Diagnosis not present

## 2019-02-08 DIAGNOSIS — Z953 Presence of xenogenic heart valve: Secondary | ICD-10-CM | POA: Diagnosis not present

## 2019-02-08 DIAGNOSIS — Z79899 Other long term (current) drug therapy: Secondary | ICD-10-CM | POA: Diagnosis not present

## 2019-02-08 DIAGNOSIS — I10 Essential (primary) hypertension: Secondary | ICD-10-CM | POA: Diagnosis not present

## 2019-02-08 DIAGNOSIS — L02416 Cutaneous abscess of left lower limb: Secondary | ICD-10-CM | POA: Diagnosis not present

## 2019-02-08 DIAGNOSIS — L03116 Cellulitis of left lower limb: Secondary | ICD-10-CM | POA: Diagnosis not present

## 2019-02-08 DIAGNOSIS — Z09 Encounter for follow-up examination after completed treatment for conditions other than malignant neoplasm: Secondary | ICD-10-CM | POA: Diagnosis not present

## 2019-02-08 DIAGNOSIS — I358 Other nonrheumatic aortic valve disorders: Secondary | ICD-10-CM | POA: Diagnosis not present

## 2019-02-12 DIAGNOSIS — B955 Unspecified streptococcus as the cause of diseases classified elsewhere: Secondary | ICD-10-CM | POA: Diagnosis not present

## 2019-02-12 DIAGNOSIS — I10 Essential (primary) hypertension: Secondary | ICD-10-CM | POA: Diagnosis not present

## 2019-02-12 DIAGNOSIS — I330 Acute and subacute infective endocarditis: Secondary | ICD-10-CM | POA: Diagnosis not present

## 2019-02-12 DIAGNOSIS — Z96652 Presence of left artificial knee joint: Secondary | ICD-10-CM | POA: Diagnosis not present

## 2019-02-12 DIAGNOSIS — G4733 Obstructive sleep apnea (adult) (pediatric): Secondary | ICD-10-CM | POA: Diagnosis not present

## 2019-02-12 DIAGNOSIS — L139 Bullous disorder, unspecified: Secondary | ICD-10-CM | POA: Diagnosis not present

## 2019-02-12 DIAGNOSIS — B954 Other streptococcus as the cause of diseases classified elsewhere: Secondary | ICD-10-CM | POA: Diagnosis not present

## 2019-02-12 DIAGNOSIS — I4891 Unspecified atrial fibrillation: Secondary | ICD-10-CM | POA: Diagnosis not present

## 2019-02-12 DIAGNOSIS — A419 Sepsis, unspecified organism: Secondary | ICD-10-CM | POA: Diagnosis not present

## 2019-02-12 DIAGNOSIS — F419 Anxiety disorder, unspecified: Secondary | ICD-10-CM | POA: Diagnosis not present

## 2019-02-12 DIAGNOSIS — R7881 Bacteremia: Secondary | ICD-10-CM | POA: Diagnosis not present

## 2019-02-12 DIAGNOSIS — L03116 Cellulitis of left lower limb: Secondary | ICD-10-CM | POA: Diagnosis not present

## 2019-02-12 DIAGNOSIS — T826XXA Infection and inflammatory reaction due to cardiac valve prosthesis, initial encounter: Secondary | ICD-10-CM | POA: Diagnosis not present

## 2019-02-13 ENCOUNTER — Telehealth: Payer: Self-pay | Admitting: Interventional Cardiology

## 2019-02-13 NOTE — Telephone Encounter (Signed)
  Pt c/o BP issue: STAT if pt c/o blurred vision, one-sided weakness or slurred speech  1. What are your last 5 BP readings? 172/110, 150/100, states his readings have been 150s over 100-110   2. Are you having any other symptoms (ex. Dizziness, headache, blurred vision, passed out)? Denies these symptoms  3. What is your BP issue? Patient is concerned because his recent readings are running high. Would like to speak to the nurse

## 2019-02-13 NOTE — Telephone Encounter (Signed)
Pt recently hospitalized for sepsis.  Went home on 9/3 and has noticed BP rising since then.  Pt states BP was bottoming out in the hospital so they decided to d/c his Metoprolol Succinate 25mg  QD.  BP this morning prior to meds was 151/111/  Took Amlodipine at 10:30A.  Pt checked BP while on the phone and it was 153/100, HR 77.  Denies any sx.  Pt has been getting readings that are mostly 150s-170s/90s-110s.    Advised pt to rest and recheck BP around 1pm as Amlodipine may not have taken full effect yet.  Advised I will route to Dr. Tamala Julian for review and advisement.

## 2019-02-14 DIAGNOSIS — A419 Sepsis, unspecified organism: Secondary | ICD-10-CM | POA: Diagnosis not present

## 2019-02-14 DIAGNOSIS — Z96652 Presence of left artificial knee joint: Secondary | ICD-10-CM | POA: Diagnosis not present

## 2019-02-14 DIAGNOSIS — B955 Unspecified streptococcus as the cause of diseases classified elsewhere: Secondary | ICD-10-CM | POA: Diagnosis not present

## 2019-02-14 DIAGNOSIS — L03116 Cellulitis of left lower limb: Secondary | ICD-10-CM | POA: Diagnosis not present

## 2019-02-14 MED ORDER — METOPROLOL SUCCINATE ER 25 MG PO TB24: 25.0000 mg | ORAL_TABLET | Freq: Every day | ORAL | 3 refills | Status: DC

## 2019-02-14 NOTE — Telephone Encounter (Signed)
Pt replied through [place].  Sent recommendations through there.

## 2019-02-14 NOTE — Telephone Encounter (Signed)
Pt sent MyChart message stating he took Metoprolol Succ 25mg  last night and BP at 1pm today was 168/103, HR 65.  Spoke with Dr. Angelena Form, DOD and he said to have pt resume Metoprolol Succ 25mg  QD and monitor BP and call with those readings later this week or early next week so we can see if BP is coming back down.  Called pt and left message to call back.

## 2019-02-15 DIAGNOSIS — L139 Bullous disorder, unspecified: Secondary | ICD-10-CM | POA: Diagnosis not present

## 2019-02-15 DIAGNOSIS — F419 Anxiety disorder, unspecified: Secondary | ICD-10-CM | POA: Diagnosis not present

## 2019-02-15 DIAGNOSIS — G4733 Obstructive sleep apnea (adult) (pediatric): Secondary | ICD-10-CM | POA: Diagnosis not present

## 2019-02-15 DIAGNOSIS — I330 Acute and subacute infective endocarditis: Secondary | ICD-10-CM | POA: Diagnosis not present

## 2019-02-15 DIAGNOSIS — B954 Other streptococcus as the cause of diseases classified elsewhere: Secondary | ICD-10-CM | POA: Diagnosis not present

## 2019-02-15 DIAGNOSIS — R7881 Bacteremia: Secondary | ICD-10-CM | POA: Diagnosis not present

## 2019-02-15 DIAGNOSIS — I4891 Unspecified atrial fibrillation: Secondary | ICD-10-CM | POA: Diagnosis not present

## 2019-02-15 DIAGNOSIS — I10 Essential (primary) hypertension: Secondary | ICD-10-CM | POA: Diagnosis not present

## 2019-02-15 DIAGNOSIS — L03116 Cellulitis of left lower limb: Secondary | ICD-10-CM | POA: Diagnosis not present

## 2019-02-15 NOTE — Telephone Encounter (Signed)
Sent information to pt through [place]

## 2019-02-15 NOTE — Telephone Encounter (Signed)
Let him know that we want target blood pressure 130/80 mmHg.  We have additional options that could be added to help achieve that.  Keep [place] posted relative to blood pressures.

## 2019-02-19 ENCOUNTER — Telehealth: Payer: Self-pay

## 2019-02-19 ENCOUNTER — Other Ambulatory Visit (HOSPITAL_COMMUNITY)
Admission: RE | Admit: 2019-02-19 | Discharge: 2019-02-19 | Disposition: A | Discharge status: Home / Self Care | Payer: BC Managed Care – PPO | Source: Other Acute Inpatient Hospital | Attending: Infectious Diseases | Admitting: Infectious Diseases

## 2019-02-19 DIAGNOSIS — R7881 Bacteremia: Secondary | ICD-10-CM | POA: Diagnosis not present

## 2019-02-19 DIAGNOSIS — L03116 Cellulitis of left lower limb: Secondary | ICD-10-CM | POA: Diagnosis not present

## 2019-02-19 DIAGNOSIS — F419 Anxiety disorder, unspecified: Secondary | ICD-10-CM | POA: Diagnosis not present

## 2019-02-19 DIAGNOSIS — G4733 Obstructive sleep apnea (adult) (pediatric): Secondary | ICD-10-CM | POA: Diagnosis not present

## 2019-02-19 DIAGNOSIS — I10 Essential (primary) hypertension: Secondary | ICD-10-CM | POA: Diagnosis not present

## 2019-02-19 DIAGNOSIS — L139 Bullous disorder, unspecified: Secondary | ICD-10-CM | POA: Diagnosis not present

## 2019-02-19 DIAGNOSIS — T826XXA Infection and inflammatory reaction due to cardiac valve prosthesis, initial encounter: Secondary | ICD-10-CM | POA: Insufficient documentation

## 2019-02-19 DIAGNOSIS — I330 Acute and subacute infective endocarditis: Secondary | ICD-10-CM | POA: Diagnosis not present

## 2019-02-19 DIAGNOSIS — Z96652 Presence of left artificial knee joint: Secondary | ICD-10-CM | POA: Diagnosis not present

## 2019-02-19 DIAGNOSIS — B954 Other streptococcus as the cause of diseases classified elsewhere: Secondary | ICD-10-CM | POA: Diagnosis not present

## 2019-02-19 DIAGNOSIS — I4891 Unspecified atrial fibrillation: Secondary | ICD-10-CM | POA: Diagnosis not present

## 2019-02-19 DIAGNOSIS — B955 Unspecified streptococcus as the cause of diseases classified elsewhere: Secondary | ICD-10-CM | POA: Diagnosis not present

## 2019-02-19 DIAGNOSIS — A419 Sepsis, unspecified organism: Secondary | ICD-10-CM | POA: Diagnosis not present

## 2019-02-19 LAB — CBC WITH DIFFERENTIAL/PLATELET
Abs Immature Granulocytes: 0.05 10*3/uL (ref 0.00–0.07)
Basophils Absolute: 0.1 10*3/uL (ref 0.0–0.1)
Basophils Relative: 1 %
Eosinophils Absolute: 0.6 10*3/uL — ABNORMAL HIGH (ref 0.0–0.5)
Eosinophils Relative: 8 %
HCT: 36.7 % — ABNORMAL LOW (ref 39.0–52.0)
Hemoglobin: 11.9 g/dL — ABNORMAL LOW (ref 13.0–17.0)
Immature Granulocytes: 1 %
Lymphocytes Relative: 15 %
Lymphs Abs: 1.1 10*3/uL (ref 0.7–4.0)
MCH: 30.1 pg (ref 26.0–34.0)
MCHC: 32.4 g/dL (ref 30.0–36.0)
MCV: 92.7 fL (ref 80.0–100.0)
Monocytes Absolute: 0.7 10*3/uL (ref 0.1–1.0)
Monocytes Relative: 10 %
Neutro Abs: 4.8 10*3/uL (ref 1.7–7.7)
Neutrophils Relative %: 65 %
Platelets: 307 10*3/uL (ref 150–400)
RBC: 3.96 MIL/uL — ABNORMAL LOW (ref 4.22–5.81)
RDW: 13.3 % (ref 11.5–15.5)
WBC: 7.4 10*3/uL (ref 4.0–10.5)
nRBC: 0 % (ref 0.0–0.2)

## 2019-02-19 LAB — COMPREHENSIVE METABOLIC PANEL
ALT: 17 U/L (ref 0–44)
AST: 19 U/L (ref 15–41)
Albumin: 2.9 g/dL — ABNORMAL LOW (ref 3.5–5.0)
Alkaline Phosphatase: 85 U/L (ref 38–126)
Anion gap: 10 (ref 5–15)
BUN: 20 mg/dL (ref 8–23)
CO2: 25 mmol/L (ref 22–32)
Calcium: 8.8 mg/dL — ABNORMAL LOW (ref 8.9–10.3)
Chloride: 100 mmol/L (ref 98–111)
Creatinine, Ser: 1.13 mg/dL (ref 0.61–1.24)
GFR calc Af Amer: >60 mL/min (ref >60)
GFR calc non Af Amer: >60 mL/min (ref >60)
Glucose, Bld: 145 mg/dL — ABNORMAL HIGH (ref 70–99)
Potassium: 3.9 mmol/L (ref 3.5–5.1)
Sodium: 135 mmol/L (ref 135–145)
Total Bilirubin: 0.4 mg/dL (ref 0.3–1.2)
Total Protein: 7.6 g/dL (ref 6.5–8.1)

## 2019-02-19 NOTE — Telephone Encounter (Addendum)
Per Dr. Tamala Julian, the pts TTE cancelled for 02/27/19 and pt scheduled for TEE 02/27/19 at 10:00 am with Dr. Oval Linsey [id number].   Pt instructions sent to my chart and discussed with the pt.. Pt verbalized understanding and will call if he develops any questions.   COVID testing 02/24/19. Pt will quarantine prior to the procedure.

## 2019-02-20 ENCOUNTER — Other Ambulatory Visit: Payer: Self-pay | Admitting: Interventional Cardiology

## 2019-02-21 DIAGNOSIS — Z96652 Presence of left artificial knee joint: Secondary | ICD-10-CM | POA: Diagnosis not present

## 2019-02-21 DIAGNOSIS — L03116 Cellulitis of left lower limb: Secondary | ICD-10-CM | POA: Diagnosis not present

## 2019-02-21 DIAGNOSIS — A419 Sepsis, unspecified organism: Secondary | ICD-10-CM | POA: Diagnosis not present

## 2019-02-21 DIAGNOSIS — B955 Unspecified streptococcus as the cause of diseases classified elsewhere: Secondary | ICD-10-CM | POA: Diagnosis not present

## 2019-02-23 ENCOUNTER — Other Ambulatory Visit: Payer: BC Managed Care – PPO

## 2019-02-23 ENCOUNTER — Other Ambulatory Visit (HOSPITAL_COMMUNITY): Payer: BC Managed Care – PPO

## 2019-02-23 DIAGNOSIS — I10 Essential (primary) hypertension: Secondary | ICD-10-CM | POA: Diagnosis not present

## 2019-02-23 DIAGNOSIS — B954 Other streptococcus as the cause of diseases classified elsewhere: Secondary | ICD-10-CM | POA: Diagnosis not present

## 2019-02-23 DIAGNOSIS — G4733 Obstructive sleep apnea (adult) (pediatric): Secondary | ICD-10-CM | POA: Diagnosis not present

## 2019-02-23 DIAGNOSIS — L03116 Cellulitis of left lower limb: Secondary | ICD-10-CM | POA: Diagnosis not present

## 2019-02-23 DIAGNOSIS — F419 Anxiety disorder, unspecified: Secondary | ICD-10-CM | POA: Diagnosis not present

## 2019-02-23 DIAGNOSIS — I330 Acute and subacute infective endocarditis: Secondary | ICD-10-CM | POA: Diagnosis not present

## 2019-02-23 DIAGNOSIS — I4891 Unspecified atrial fibrillation: Secondary | ICD-10-CM | POA: Diagnosis not present

## 2019-02-23 DIAGNOSIS — L139 Bullous disorder, unspecified: Secondary | ICD-10-CM | POA: Diagnosis not present

## 2019-02-23 DIAGNOSIS — R7881 Bacteremia: Secondary | ICD-10-CM | POA: Diagnosis not present

## 2019-02-24 ENCOUNTER — Other Ambulatory Visit (HOSPITAL_COMMUNITY)
Admission: RE | Admit: 2019-02-24 | Discharge: 2019-02-24 | Disposition: A | Discharge status: Home / Self Care | Payer: BC Managed Care – PPO | Source: Ambulatory Visit | Attending: Cardiovascular Disease | Admitting: Cardiovascular Disease

## 2019-02-24 DIAGNOSIS — Z20828 Contact with and (suspected) exposure to other viral communicable diseases: Secondary | ICD-10-CM | POA: Insufficient documentation

## 2019-02-24 DIAGNOSIS — Z01812 Encounter for preprocedural laboratory examination: Secondary | ICD-10-CM | POA: Diagnosis not present

## 2019-02-25 LAB — NOVEL CORONAVIRUS, NAA (HOSP ORDER, SEND-OUT TO REF LAB; TAT 18-24 HRS): SARS-CoV-2, NAA: NOT DETECTED

## 2019-02-26 ENCOUNTER — Other Ambulatory Visit (HOSPITAL_COMMUNITY): Payer: BC Managed Care – PPO

## 2019-02-27 ENCOUNTER — Other Ambulatory Visit: Payer: Self-pay

## 2019-02-27 ENCOUNTER — Inpatient Hospital Stay: Payer: BC Managed Care – PPO | Admitting: Infectious Diseases

## 2019-02-27 ENCOUNTER — Ambulatory Visit (HOSPITAL_COMMUNITY)
Admission: RE | Admit: 2019-02-27 | Discharge: 2019-02-27 | Disposition: A | Discharge status: Home / Self Care | Payer: BC Managed Care – PPO | Source: Ambulatory Visit | Attending: Cardiovascular Disease | Admitting: Cardiovascular Disease

## 2019-02-27 ENCOUNTER — Encounter (HOSPITAL_COMMUNITY)
Admission: RE | Disposition: A | Discharge status: Home / Self Care | Payer: BC Managed Care – PPO | Source: Ambulatory Visit | Attending: Cardiovascular Disease

## 2019-02-27 ENCOUNTER — Encounter (HOSPITAL_COMMUNITY): Payer: Self-pay | Admitting: *Deleted

## 2019-02-27 ENCOUNTER — Ambulatory Visit (HOSPITAL_BASED_OUTPATIENT_CLINIC_OR_DEPARTMENT_OTHER): Payer: BC Managed Care – PPO

## 2019-02-27 ENCOUNTER — Other Ambulatory Visit (HOSPITAL_COMMUNITY)
Admission: RE | Admit: 2019-02-27 | Discharge: 2019-02-27 | Disposition: A | Discharge status: Home / Self Care | Payer: BC Managed Care – PPO | Attending: Infectious Diseases | Admitting: Infectious Diseases

## 2019-02-27 DIAGNOSIS — I10 Essential (primary) hypertension: Secondary | ICD-10-CM | POA: Insufficient documentation

## 2019-02-27 DIAGNOSIS — I350 Nonrheumatic aortic (valve) stenosis: Secondary | ICD-10-CM | POA: Diagnosis not present

## 2019-02-27 DIAGNOSIS — L03116 Cellulitis of left lower limb: Secondary | ICD-10-CM | POA: Diagnosis not present

## 2019-02-27 DIAGNOSIS — I38 Endocarditis, valve unspecified: Secondary | ICD-10-CM | POA: Diagnosis not present

## 2019-02-27 DIAGNOSIS — E785 Hyperlipidemia, unspecified: Secondary | ICD-10-CM | POA: Insufficient documentation

## 2019-02-27 DIAGNOSIS — A419 Sepsis, unspecified organism: Secondary | ICD-10-CM | POA: Diagnosis not present

## 2019-02-27 DIAGNOSIS — Y839 Surgical procedure, unspecified as the cause of abnormal reaction of the patient, or of later complication, without mention of misadventure at the time of the procedure: Secondary | ICD-10-CM | POA: Diagnosis not present

## 2019-02-27 DIAGNOSIS — R7881 Bacteremia: Secondary | ICD-10-CM

## 2019-02-27 DIAGNOSIS — T826XXA Infection and inflammatory reaction due to cardiac valve prosthesis, initial encounter: Secondary | ICD-10-CM | POA: Diagnosis not present

## 2019-02-27 DIAGNOSIS — I4891 Unspecified atrial fibrillation: Secondary | ICD-10-CM | POA: Diagnosis not present

## 2019-02-27 DIAGNOSIS — I330 Acute and subacute infective endocarditis: Secondary | ICD-10-CM

## 2019-02-27 DIAGNOSIS — Z96652 Presence of left artificial knee joint: Secondary | ICD-10-CM | POA: Diagnosis not present

## 2019-02-27 DIAGNOSIS — B955 Unspecified streptococcus as the cause of diseases classified elsewhere: Secondary | ICD-10-CM | POA: Diagnosis not present

## 2019-02-27 HISTORY — PX: TEE WITHOUT CARDIOVERSION: SHX5443

## 2019-02-27 LAB — CBC WITH DIFFERENTIAL/PLATELET
Abs Immature Granulocytes: 0.04 10*3/uL (ref 0.00–0.07)
Basophils Absolute: 0.1 10*3/uL (ref 0.0–0.1)
Basophils Relative: 1 %
Eosinophils Absolute: 0.3 10*3/uL (ref 0.0–0.5)
Eosinophils Relative: 4 %
HCT: 38.7 % — ABNORMAL LOW (ref 39.0–52.0)
Hemoglobin: 12.5 g/dL — ABNORMAL LOW (ref 13.0–17.0)
Immature Granulocytes: 1 %
Lymphocytes Relative: 19 %
Lymphs Abs: 1.5 10*3/uL (ref 0.7–4.0)
MCH: 30 pg (ref 26.0–34.0)
MCHC: 32.3 g/dL (ref 30.0–36.0)
MCV: 92.8 fL (ref 80.0–100.0)
Monocytes Absolute: 0.7 10*3/uL (ref 0.1–1.0)
Monocytes Relative: 9 %
Neutro Abs: 5.1 10*3/uL (ref 1.7–7.7)
Neutrophils Relative %: 66 %
Platelets: 297 10*3/uL (ref 150–400)
RBC: 4.17 MIL/uL — ABNORMAL LOW (ref 4.22–5.81)
RDW: 13.5 % (ref 11.5–15.5)
WBC: 7.6 10*3/uL (ref 4.0–10.5)
nRBC: 0 % (ref 0.0–0.2)

## 2019-02-27 LAB — COMPREHENSIVE METABOLIC PANEL
ALT: 18 U/L (ref 0–44)
AST: 17 U/L (ref 15–41)
Albumin: 3.1 g/dL — ABNORMAL LOW (ref 3.5–5.0)
Alkaline Phosphatase: 86 U/L (ref 38–126)
Anion gap: 9 (ref 5–15)
BUN: 20 mg/dL (ref 8–23)
CO2: 24 mmol/L (ref 22–32)
Calcium: 9.2 mg/dL (ref 8.9–10.3)
Chloride: 106 mmol/L (ref 98–111)
Creatinine, Ser: 1.23 mg/dL (ref 0.61–1.24)
GFR calc Af Amer: >60 mL/min (ref >60)
GFR calc non Af Amer: 60 mL/min — ABNORMAL LOW (ref >60)
Glucose, Bld: 102 mg/dL — ABNORMAL HIGH (ref 70–99)
Potassium: 3.8 mmol/L (ref 3.5–5.1)
Sodium: 139 mmol/L (ref 135–145)
Total Bilirubin: 0.5 mg/dL (ref 0.3–1.2)
Total Protein: 7.4 g/dL (ref 6.5–8.1)

## 2019-02-27 SURGERY — ECHOCARDIOGRAM, TRANSESOPHAGEAL
Anesthesia: Moderate Sedation

## 2019-02-27 MED ORDER — MIDAZOLAM HCL (PF) 5 MG/ML IJ SOLN
INTRAMUSCULAR | Status: AC
  Filled 2019-02-27: qty 2

## 2019-02-27 MED ORDER — MIDAZOLAM HCL (PF) 10 MG/2ML IJ SOLN
INTRAMUSCULAR | Status: DC | PRN
  Administered 2019-02-27 (×4): 2 mg via INTRAVENOUS

## 2019-02-27 MED ORDER — SODIUM CHLORIDE 0.9 % IV SOLN
INTRAVENOUS | Status: DC
  Administered 2019-02-27: 500 mL via INTRAVENOUS

## 2019-02-27 MED ORDER — DIPHENHYDRAMINE HCL 50 MG/ML IJ SOLN
INTRAMUSCULAR | Status: AC
  Filled 2019-02-27: qty 1

## 2019-02-27 MED ORDER — DIPHENHYDRAMINE HCL 50 MG/ML IJ SOLN
INTRAMUSCULAR | Status: DC | PRN
  Administered 2019-02-27: 25 mg via INTRAVENOUS

## 2019-02-27 MED ORDER — BUTAMBEN-TETRACAINE-BENZOCAINE 2-2-14 % EX AERO
INHALATION_SPRAY | CUTANEOUS | Status: DC | PRN
  Administered 2019-02-27: 10:00:00 2 via TOPICAL

## 2019-02-27 MED ORDER — FENTANYL CITRATE (PF) 100 MCG/2ML IJ SOLN
INTRAMUSCULAR | Status: AC
  Filled 2019-02-27: qty 2

## 2019-02-27 MED ORDER — FENTANYL CITRATE (PF) 100 MCG/2ML IJ SOLN
INTRAMUSCULAR | Status: DC | PRN
  Administered 2019-02-27 (×4): 25 ug via INTRAVENOUS

## 2019-02-27 NOTE — Discharge Instructions (Signed)

## 2019-02-27 NOTE — H&P (Signed)
[name] [name] is a 68 y.o. male who has presented today for surgery, with the diagnosis of bacteremia, prosthetic aortic valve endocarditis.  The various methods of treatment have been discussed with the patient and family. After consideration of risks, benefits and other options for treatment, the patient has consented to  Procedure(s): TRANSESOPHAGEAL ECHOCARDIOGRAM (TEE) (N/A) as a surgical intervention .  The patient's history has been reviewed, patient examined, no change in status, stable for surgery.  I have reviewed the patient's chart and labs.  Questions were answered to the patient's satisfaction.    Jakhiya Brower C. Oval Linsey, MD, [facility]  02/27/2019 9:59 AM

## 2019-02-27 NOTE — Progress Notes (Signed)
  Echocardiogram Echocardiogram Transesophageal has been performed.  [name] [name] 02/27/2019, 10:45 AM

## 2019-02-27 NOTE — CV Procedure (Signed)
Brief TEE Note  LVEF >55% LAA surgically absent Mild MR, mild AR, trivial TR Mild bioprosthetic valve aortic stenosis unchanged from prior .  Mean gradient 19 mmHg. Thickening and restriction mostly of the leaflet in the right coronary cusp position No mobile targets concerning for endocarditis identified.  During this procedure the patient is administered a total of diphenhydramine 25mg , Versed 8 mg and Fentanyl 100 mcg to achieve and maintain moderate conscious sedation.  The patient's heart rate, blood pressure, and oxygen saturation are monitored continuously during the procedure. The period of conscious sedation is 12 minutes, of which I was present face-to-face 100% of this time.  Cassidee Deats C. Oval Linsey, MD, [facility] 02/27/2019 10:41 AM

## 2019-03-02 NOTE — Progress Notes (Signed)
CARDIOLOGY OFFICE NOTE  Date:  03/06/2019    [name] [name] Date of Birth: 08-06-1950 Medical Record #[id number]  PCP:  [name] Bale, PA-C  Cardiologist:  [name] [name]   Chief Complaint  Patient presents with  . Follow-up    Post hospital visit - seen for Dr. Tamala [name]    History of Present Illness: [name] [name] is a 68 y.o. male who presents today for a 6 month check. Seen for Dr. Tamala [name].   He has a history of a bicuspid aortic valve with aortic aneurysm status post Bentall procedure2008 with MAZE procedure for paroxysmal atrial fibrillation [name] [name]), obesity, DM, obstructive sleep apnea, and hypertension.  Last seen in March by Dr. Tamala [name] - felt to be doing well. Echo was to be arranged but then the pandemic struck.   Noted he was admitted in August with cellulits of the lower left leg - ended up presenting with confusion/fever and weakness. Temp of 104. Cultures grew Group G strep - TEE with small mobile density on his bioprosthetic valve - treated with antibiotics - no surgical intervention needed. Some of his medicines were held. Was to have follow up TEE after 6 weeks of therapy for the aortic valve vegetation - this was done on 02/27/2019 - no vegetation noted.   The patient does not have symptoms concerning for COVID-19 infection (fever, chills, cough, or new shortness of breath).   Comes in today. Here alone. He is doing well. Little bummed - thought he was finishing tomorrow with his antibiotics and to get his PICC line out - now changed to Thursday. BP is up some - ID thought the Rifampin was playing a role. Also using lots of Advil to - has had a bad knee. Has been eating [place]. He feels good. No chest pain. Breathing is good. No fever. Still does not know how he got cellulitis of his leg. He would like to return to work to work next week - has been working remotely as a Air traffic controller.   Past Medical History:  Diagnosis Date  . Bicuspid aortic valve    Bentall  procedure 2008  . Cellulitis 12/2018   LEFT LOWER EXTREMITY  . Heart attack ([place])   . Hypertension   . S/P aortic valve replacement with bioprosthetic valve 04/19/2007   25 mm Edwards Magna bovine pericardial tissue valve  . S/P ascending aortic aneurysm repair 04/19/2007   supracoronary straight graft  . S/P Maze operation for atrial fibrillation 04/19/2007    Past Surgical History:  Procedure Laterality Date  . CARDIAC CATHETERIZATION    . CARDIAC VALVE REPLACEMENT     2008  . CORONARY ARTERY BYPASS GRAFT  2007  . TEE WITHOUT CARDIOVERSION N/A 01/30/2019   Procedure: TRANSESOPHAGEAL ECHOCARDIOGRAM (TEE);  Surgeon: [name] Latch, MD;  Location: [facility];  Service: Cardiovascular;  Laterality: N/A;  . TEE WITHOUT CARDIOVERSION N/A 02/27/2019   Procedure: TRANSESOPHAGEAL ECHOCARDIOGRAM (TEE);  Surgeon: [name] Latch, MD;  Location: [place] [place];  Service: Cardiovascular;  Laterality: N/A;  . TONSILLECTOMY     age 85  . TOTAL KNEE ARTHROPLASTY Left 03/20/2018   Procedure: LEFT TOTAL KNEE ARTHROPLASTY;  Surgeon: [name] Arabian, MD;  Location: [facility];  Service: Orthopedics;  Laterality: Left;  Marland Kitchen VASECTOMY       Medications: Current Meds  Medication Sig  . ALPRAZolam (XANAX XR) 0.5 MG 24 hr tablet Take 0.5 mg by mouth daily.  Marland Kitchen amLODipine (NORVASC) 10 MG tablet Take 1 tablet (10  mg total) by mouth daily.  Marland Kitchen atorvastatin (LIPITOR) 20 MG tablet Take 1 tablet (20 mg total) by mouth daily.  Marland Kitchen escitalopram (LEXAPRO) 20 MG tablet Take 1 tablet (20 mg total) by mouth daily.  Marland Kitchen ibuprofen (ADVIL) 200 MG tablet Take 400 mg by mouth every 8 (eight) hours as needed for moderate pain.   . metoprolol succinate (TOPROL-XL) 25 MG 24 hr tablet Take 1 tablet (25 mg total) by mouth daily.  . mupirocin ointment (BACTROBAN) 2 % Apply 1 application topically daily as needed (wound care).   . penicillin G IVPB Inject 24 Million Units into the vein daily. As a continuous infusion Indication:   Streptococcal endocarditis  Last Day of Therapy:  03/07/2019 Labs - Once weekly:  CBC/D and CMP, Labs - Every other week:  ESR and CRP (Patient taking differently: Inject 24 Million Units into the vein continuous. As a continuous infusion Indication:  Streptococcal endocarditis  Last Day of Therapy:  03/07/2019 Labs - Once weekly:  CBC/D and CMP, Labs - Every other week:  ESR and CRP)  . rifampin (RIFADIN) 300 MG capsule Take 1 capsule (300 mg total) by mouth 3 (three) times daily.  . sildenafil (VIAGRA) 100 MG tablet Take 0.5-1 tablets (50-100 mg total) by mouth daily as needed for erectile dysfunction. (Patient taking differently: Take 50-100 mg by mouth daily as needed for erectile dysfunction. ERECTILE DYSFUNCTION)     Allergies: Allergies  Allergen Reactions  . Amiodarone Rash    Social History: The patient  reports that he quit smoking about 15 years ago. He has a 7.50 pack-year smoking history. He has never used smokeless tobacco. He reports previous alcohol use. He reports that he does not use drugs.   Family History: The patient's family history includes Atrial fibrillation in his brother and father; COPD in his mother; Cancer in his father, maternal grandfather, and maternal grandmother; Kidney disease in his maternal grandfather and maternal grandmother; Stroke in his paternal grandfather.   Review of Systems: Please see the history of present illness.   All other systems are reviewed and negative.   Physical Exam: VS:  BP (!) 160/100   Pulse 73   Ht _0  (1.803 m)   Wt 251 lb (113.9 kg)   SpO2 98%   BMI 35.01 kg/m  .  BMI Body mass index is 35.01 kg/m.  Wt Readings from Last 3 Encounters:  03/06/19 251 lb (113.9 kg)  02/27/19 250 lb (113.4 kg)  02/01/19 262 lb 1.6 oz (118.9 kg)    General: Pleasant. Well developed, well nourished and in no acute distress.   HEENT: Normal.  Neck: Supple, no JVD, carotid bruits, or masses noted.  Cardiac: Regular rate and  rhythm. Valve sounds good - soft outflow murmur.  No edema.  Respiratory:  Lungs are clear to auscultation bilaterally with normal work of breathing.  GI: Soft and nontender.  MS: No deformity or atrophy. Gait and ROM intact. He has a walker.  Skin: Warm and dry. Color is normal.  Neuro:  Strength and sensation are intact and no gross focal deficits noted.  Psych: Alert, appropriate and with normal affect.   LABORATORY DATA:  EKG:  EKG is not ordered today.   Lab Results  Component Value Date   WBC 6.9 03/05/2019   HGB 11.5 (L) 03/05/2019   HCT 36.6 (L) 03/05/2019   PLT 247 03/05/2019   GLUCOSE 146 (H) 03/05/2019   CHOL 128 10/22/2017   TRIG 53 10/22/2017  HDL 50 10/22/2017   LDLCALC 67 10/22/2017   ALT 16 03/05/2019   AST 18 03/05/2019   NA 142 03/05/2019   K 4.0 03/05/2019   CL 109 03/05/2019   CREATININE 1.01 03/05/2019   BUN 17 03/05/2019   CO2 29 03/05/2019   TSH 0.960 10/22/2017   PSA 1.25 08/07/2014   INR 1.2 01/25/2019   HGBA1C 5.7 (H) 01/26/2019     BNP (last 3 results) No results for input(s): BNP in the last 8760 hours.  ProBNP (last 3 results) No results for input(s): PROBNP in the last 8760 hours.   Other Studies Reviewed Today:  TEE IMPRESSIONS 02/27/2019   1. Left ventricular ejection fraction, by visual estimation, is 60 to 65%. The left ventricle has normal function. Normal left ventricular size. There is no left ventricular hypertrophy.  2. Global right ventricle has normal systolic function.The right ventricular size is normal. No increase in right ventricular wall thickness.  3. Left atrial size was normal.  4. Left atrial appendage surgically absent.  5. Right atrial size was normal.  6. The mitral valve is normal in structure. No evidence of mitral valve regurgitation. No evidence of mitral stenosis.  7. The tricuspid valve is normal in structure. Tricuspid valve regurgitation was not visualized by color flow Doppler.  8. Aortic valve  regurgitation is mild by color flow Doppler. Mild aortic valve stenosis.  9. No evidence of endocarditis. 10. The pulmonic valve was normal in structure. Pulmonic valve regurgitation is not visualized by color flow Doppler. 11. The inferior vena cava is normal in size with greater than 50% respiratory variability, suggesting right atrial pressure of 3 mmHg.    TEE IMPRESSIONS 01/30/2019   1. The left ventricle has normal systolic function, with an ejection fraction of 60-65%. The cavity size was normal.  2. The right ventricle has normal systolc function. The cavity was normal. There is no increase in right ventricular wall thickness.  3. Left atrial appendage surgically absent.  4. No evidence of mitral valve stenosis.  5. Moderate thickening of the aortic valve Moderate calcification of the aortic valve. Aortic valve regurgitation is mild by color flow Doppler. Mild stenosis of the aortic valve.  6. Bioprosthetic aortic valve. The leaflet equivalent to the R coronary cusp is heavily calcified and restricted. The other leaflets are unrestricted. Mean gradient is mildly increased consistent with mild aortic stenosis. It appears that there is a  small (0.42 cm x 0.55 cm) mobile target on this leaflet. Finding concerning for endocarditis.   Assessment/Plan:  1. Recent bout of endocarditis - to finish his antibiotics on Thursday this week and get his PICC out. His most recent TEE was negative for vegetation. He is doing well clinically. Ok to return to work on Monday - note given/faxed.   2. Prior AVR/Bentall/MAZE - remains in sinus - restarting aspirin. Reminded of SBE.   3. PAF - in sinus on exam.   4. HLD - remains on statin.    5. HTN - BP is up - may be the Rifampin - plus using NSAID and getting to much salt - will see back next week - he is to bring his cuff (has a wrist cuff) - may need to start ACE or ARB at that time.   6. Obesity  7. COVID-19 Education: The signs and  symptoms of COVID-19 were discussed with the patient and how to seek care for testing (follow up with PCP or arrange E-visit).  The importance of  social distancing, staying at home, hand hygiene and wearing a mask when out in public were discussed today.  Current medicines are reviewed with the patient today.  The patient does not have concerns regarding medicines other than what has been noted above.  The following changes have been made:  See above.  Labs/ tests ordered today include:   No orders of the defined types were placed in this encounter.    Disposition:   FU with me next week.    Patient is agreeable to this plan and will call if any problems develop in the interim.   SignedTruitt Merle, NP  03/06/2019 4:04 PM  [facility] [address] [place], [place]  [address] Phone: [phone] Fax: [phone]

## 2019-03-05 ENCOUNTER — Other Ambulatory Visit (HOSPITAL_COMMUNITY)
Admission: RE | Admit: 2019-03-05 | Discharge: 2019-03-05 | Disposition: A | Discharge status: Home / Self Care | Payer: BC Managed Care – PPO | Source: Other Acute Inpatient Hospital | Attending: Infectious Diseases | Admitting: Infectious Diseases

## 2019-03-05 DIAGNOSIS — T826XXA Infection and inflammatory reaction due to cardiac valve prosthesis, initial encounter: Secondary | ICD-10-CM | POA: Diagnosis not present

## 2019-03-05 LAB — COMPREHENSIVE METABOLIC PANEL
ALT: 16 U/L (ref 0–44)
AST: 18 U/L (ref 15–41)
Albumin: 2.9 g/dL — ABNORMAL LOW (ref 3.5–5.0)
Alkaline Phosphatase: 100 U/L (ref 38–126)
Anion gap: 4 — ABNORMAL LOW (ref 5–15)
BUN: 17 mg/dL (ref 8–23)
CO2: 29 mmol/L (ref 22–32)
Calcium: 8.6 mg/dL — ABNORMAL LOW (ref 8.9–10.3)
Chloride: 109 mmol/L (ref 98–111)
Creatinine, Ser: 1.01 mg/dL (ref 0.61–1.24)
GFR calc Af Amer: >60 mL/min (ref >60)
GFR calc non Af Amer: >60 mL/min (ref >60)
Glucose, Bld: 146 mg/dL — ABNORMAL HIGH (ref 70–99)
Potassium: 4 mmol/L (ref 3.5–5.1)
Sodium: 142 mmol/L (ref 135–145)
Total Bilirubin: 0.3 mg/dL (ref 0.3–1.2)
Total Protein: 6.8 g/dL (ref 6.5–8.1)

## 2019-03-05 LAB — CBC WITH DIFFERENTIAL/PLATELET
Abs Immature Granulocytes: 0.02 10*3/uL (ref 0.00–0.07)
Basophils Absolute: 0.1 10*3/uL (ref 0.0–0.1)
Basophils Relative: 1 %
Eosinophils Absolute: 0.4 10*3/uL (ref 0.0–0.5)
Eosinophils Relative: 6 %
HCT: 36.6 % — ABNORMAL LOW (ref 39.0–52.0)
Hemoglobin: 11.5 g/dL — ABNORMAL LOW (ref 13.0–17.0)
Immature Granulocytes: 0 %
Lymphocytes Relative: 18 %
Lymphs Abs: 1.2 10*3/uL (ref 0.7–4.0)
MCH: 29.7 pg (ref 26.0–34.0)
MCHC: 31.4 g/dL (ref 30.0–36.0)
MCV: 94.6 fL (ref 80.0–100.0)
Monocytes Absolute: 0.6 10*3/uL (ref 0.1–1.0)
Monocytes Relative: 9 %
Neutro Abs: 4.6 10*3/uL (ref 1.7–7.7)
Neutrophils Relative %: 66 %
Platelets: 247 10*3/uL (ref 150–400)
RBC: 3.87 MIL/uL — ABNORMAL LOW (ref 4.22–5.81)
RDW: 13.7 % (ref 11.5–15.5)
WBC: 6.9 10*3/uL (ref 4.0–10.5)
nRBC: 0 % (ref 0.0–0.2)

## 2019-03-06 ENCOUNTER — Telehealth: Payer: Self-pay

## 2019-03-06 ENCOUNTER — Encounter: Payer: Self-pay | Admitting: Nurse Practitioner

## 2019-03-06 ENCOUNTER — Other Ambulatory Visit: Payer: Self-pay

## 2019-03-06 ENCOUNTER — Ambulatory Visit: Payer: BC Managed Care – PPO | Admitting: Nurse Practitioner

## 2019-03-06 ENCOUNTER — Ambulatory Visit: Payer: BC Managed Care – PPO | Admitting: Infectious Diseases

## 2019-03-06 ENCOUNTER — Encounter: Payer: Self-pay | Admitting: Infectious Diseases

## 2019-03-06 VITALS — BP 166/97 | HR 62 | Temp 98.0°F

## 2019-03-06 VITALS — BP 160/100 | HR 73 | Ht 71.0 in | Wt 251.0 lb

## 2019-03-06 DIAGNOSIS — I330 Acute and subacute infective endocarditis: Secondary | ICD-10-CM

## 2019-03-06 DIAGNOSIS — N179 Acute kidney failure, unspecified: Secondary | ICD-10-CM | POA: Diagnosis not present

## 2019-03-06 DIAGNOSIS — L03116 Cellulitis of left lower limb: Secondary | ICD-10-CM | POA: Diagnosis not present

## 2019-03-06 DIAGNOSIS — I10 Essential (primary) hypertension: Secondary | ICD-10-CM | POA: Diagnosis not present

## 2019-03-06 DIAGNOSIS — R7881 Bacteremia: Secondary | ICD-10-CM | POA: Diagnosis not present

## 2019-03-06 DIAGNOSIS — T826XXA Infection and inflammatory reaction due to cardiac valve prosthesis, initial encounter: Secondary | ICD-10-CM

## 2019-03-06 DIAGNOSIS — I480 Paroxysmal atrial fibrillation: Secondary | ICD-10-CM

## 2019-03-06 DIAGNOSIS — I38 Endocarditis, valve unspecified: Secondary | ICD-10-CM

## 2019-03-06 DIAGNOSIS — T826XXS Infection and inflammatory reaction due to cardiac valve prosthesis, sequela: Secondary | ICD-10-CM | POA: Diagnosis not present

## 2019-03-06 DIAGNOSIS — B955 Unspecified streptococcus as the cause of diseases classified elsewhere: Secondary | ICD-10-CM

## 2019-03-06 NOTE — Patient Instructions (Addendum)
After Visit Summary:  We will be checking the following labs today - NONE   Medication Instructions:    Continue with your current medicines.   Try to stop the Advil - switch to Tylenol  Ok to resume baby aspirin   If you need a refill on your cardiac medications before your next appointment, please call your pharmacy.     Testing/Procedures To Be Arranged:  N/A  Follow-Up:   See me next week - bring your BP cuff with you so we can check it     At [facility], you and your health needs are our priority.  As part of our continuing mission to provide you with exceptional heart care, we have created designated Provider Care Teams.  These Care Teams include your primary Cardiologist (physician) and Advanced Practice Providers (APPs -  Physician Assistants and Nurse Practitioners) who all work together to provide you with the care you need, when you need it.  Special Instructions:  . Stay safe, stay home, wash your hands for at least 20 seconds and wear a mask when out in public.  . It was good to talk with you today.  . Cut back the salt - (No [place])   Call the [place] office at [phone] if you have any questions, problems or concerns.

## 2019-03-06 NOTE — Progress Notes (Signed)
Subjective:    Patient ID: [name] [name], male    DOB: December 04, 1950, 68 y.o.   MRN: [id number]  HPI The patient is a 68 year old white male with h/o AoVR in 2008 presenting for a hospital follow up visit with recent LT leg cellulitis and Group G Streptococcal sepsis/PVIE.  While hospitalized, he was confirmed to have group G strep and in his blood and while his TTE was unrevealing, his TEE did show a small mobile echodensity insistent with a vegetation to his prosthetic aortic valve.  Since discharge, he is continued to take IV penicillin continuously via an elastomeric device along with 3 times daily rifampin.  He had a TEE repeated on February 27, 2019 that showed no evidence of his prior vegetation and only slight aortic stenosis with a preserved ejection fraction.  While he is anxious to have his PICC line removed, he has been tolerating both his IV penicillin and oral rifampin without difficulty.  His creatinine has continue improve as an outpatient with his labs showing a creatinine of 1.01 from yesterday.  His white blood cell count is also returned to normal at 6900.  He admits to a poor appetite and has been ambulating only with the use of a walker since his hospital discharge.  Against advice, the patient has been using a compressive device for his arm to allow for daily showering even while his PICC line was intact.  He denies any further falls at home recently.  He has an appointment to see his cardiologist later this afternoon.   Past Medical History:  Diagnosis Date  . Bicuspid aortic valve    Bentall procedure 2008  . Cellulitis 12/2018   LEFT LOWER EXTREMITY  . Heart attack ([place])   . Hypertension   . S/P aortic valve replacement with bioprosthetic valve 04/19/2007   25 mm Edwards Magna bovine pericardial tissue valve  . S/P ascending aortic aneurysm repair 04/19/2007   supracoronary straight graft  . S/P Maze operation for atrial fibrillation 04/19/2007    Past Surgical  History:  Procedure Laterality Date  . CARDIAC CATHETERIZATION    . CARDIAC VALVE REPLACEMENT     2008  . CORONARY ARTERY BYPASS GRAFT  2007  . TEE WITHOUT CARDIOVERSION N/A 01/30/2019   Procedure: TRANSESOPHAGEAL ECHOCARDIOGRAM (TEE);  Surgeon: Skeet Latch, MD;  Location: [place] [facility];  Service: Cardiovascular;  Laterality: N/A;  . TEE WITHOUT CARDIOVERSION N/A 02/27/2019   Procedure: TRANSESOPHAGEAL ECHOCARDIOGRAM (TEE);  Surgeon: Skeet Latch, MD;  Location: [facility];  Service: Cardiovascular;  Laterality: N/A;  . TONSILLECTOMY     age 4  . TOTAL KNEE ARTHROPLASTY Left 03/20/2018   Procedure: LEFT TOTAL KNEE ARTHROPLASTY;  Surgeon: Gaynelle Arabian, MD;  Location: [facility];  Service: Orthopedics;  Laterality: Left;  Marland Kitchen VASECTOMY       Family History  Problem Relation Age of Onset  . COPD Mother   . Cancer Father   . Atrial fibrillation Father   . Atrial fibrillation Brother   . Cancer Maternal Grandmother        died at age 6  . Kidney disease Maternal Grandmother   . Cancer Maternal Grandfather   . Kidney disease Maternal Grandfather   . Stroke Paternal Grandfather      Social History   Tobacco Use  . Smoking status: Former Smoker    Packs/day: 0.30    Years: 25.00    Pack years: 7.50    Quit date: 10/04/2003    Years since quitting: 15.4  .  Smokeless tobacco: Never Used  Substance Use Topics  . Alcohol use: Not Currently    Alcohol/week: 0.0 standard drinks  . Drug use: No      reports being sexually active and has had partner(s) who are Male.   Outpatient Medications Prior to Visit  Medication Sig Dispense Refill  . ALPRAZolam (XANAX XR) 0.5 MG 24 hr tablet Take 0.5 mg by mouth daily.    Marland Kitchen ALPRAZolam (XANAX) 0.5 MG tablet Take 0.5 mg by mouth 2 (two) times daily as needed (anxiety).     Marland Kitchen amLODipine (NORVASC) 10 MG tablet Take 1 tablet (10 mg total) by mouth daily. 90 tablet 3  . atorvastatin (LIPITOR) 20 MG tablet Take 1 tablet (20 mg total) by  mouth daily. 90 tablet 1  . escitalopram (LEXAPRO) 20 MG tablet Take 1 tablet (20 mg total) by mouth daily. 90 tablet 0  . ibuprofen (ADVIL) 200 MG tablet Take 400 mg by mouth every 8 (eight) hours as needed for moderate pain.     . metoprolol succinate (TOPROL-XL) 25 MG 24 hr tablet Take 1 tablet (25 mg total) by mouth daily. 90 tablet 3  . mupirocin ointment (BACTROBAN) 2 % Apply 1 application topically daily as needed (wound care).     . penicillin G IVPB Inject 24 Million Units into the vein daily. As a continuous infusion Indication:  Streptococcal endocarditis  Last Day of Therapy:  03/07/2019 Labs - Once weekly:  CBC/D and CMP, Labs - Every other week:  ESR and CRP (Patient taking differently: Inject 24 Million Units into the vein continuous. As a continuous infusion Indication:  Streptococcal endocarditis  Last Day of Therapy:  03/07/2019 Labs - Once weekly:  CBC/D and CMP, Labs - Every other week:  ESR and CRP) 35 Units 0  . rifampin (RIFADIN) 300 MG capsule Take 1 capsule (300 mg total) by mouth 3 (three) times daily. 105 capsule 0  . sildenafil (VIAGRA) 100 MG tablet Take 0.5-1 tablets (50-100 mg total) by mouth daily as needed for erectile dysfunction. (Patient taking differently: Take 50-100 mg by mouth daily as needed for erectile dysfunction. ERECTILE DYSFUNCTION) 5 tablet 11   No facility-administered medications prior to visit.      Allergies  Allergen Reactions  . Amiodarone Rash      Review of Systems  Constitutional: Positive for appetite change and fatigue. Negative for chills and fever.  HENT: Negative for congestion, hearing loss, rhinorrhea and sinus pressure.   Eyes: Negative for photophobia, pain, redness and visual disturbance.  Respiratory: Negative for apnea, cough, shortness of breath and wheezing.   Cardiovascular: Positive for leg swelling. Negative for chest pain and palpitations.  Gastrointestinal: Negative for abdominal pain, constipation, diarrhea,  nausea and vomiting.  Endocrine: Negative for cold intolerance, heat intolerance, polydipsia and polyuria.  Genitourinary: Negative for decreased urine volume, dysuria, frequency, hematuria and testicular pain.  Musculoskeletal: Positive for joint swelling. Negative for back pain, myalgias and neck pain.  Skin: Negative for pallor and rash.  Allergic/Immunologic: Negative for immunocompromised state.  Neurological: Negative for dizziness, seizures, syncope, speech difficulty and light-headedness.  Hematological: Does not bruise/bleed easily.  Psychiatric/Behavioral: Positive for sleep disturbance. Negative for agitation and hallucinations. The patient is nervous/anxious.        Objective:     Vitals:   03/06/19 1033  BP: (!) 166/97  Pulse: 62  Temp: 98 F (36.7 C)     Physical Exam Gen: chronically ill-appearing, obese, NAD, A&Ox 3 Head: NCAT, no temporal  wasting evident EENT: PERRL, EOMI, MMM, adequate dentition Neck: supple, no JVD CV: NRRR, I/VI diastolic murmur loudest at LUSB Pulm: CTA bilaterally, no wheeze or retractions Abd: soft, obese, NTND, +BS Extrems:  1+ pitting LE edema, 1+ pulses, RT arm PICC intact w/o tenderness or surrounding erythema at insertion site Skin: chronic venous stasis to LEs RT>LT, adequate skin turgor Neuro: CN II-XII grossly intact, no focal neurologic deficits appreciated, gait was slowed (pt ambulating using walker at today's visit), A&Ox 3   Labs: Lab Results  Component Value Date   WBC 6.9 03/05/2019   HGB 11.5 (L) 03/05/2019   HCT 36.6 (L) 03/05/2019   MCV 94.6 03/05/2019   PLT 247 03/05/2019   Lab Results  Component Value Date   NA 142 03/05/2019   K 4.0 03/05/2019   CL 109 03/05/2019   CO2 29 03/05/2019   GLUCOSE 146 (H) 03/05/2019   BUN 17 03/05/2019   CREATININE 1.01 03/05/2019   CALCIUM 8.6 (L) 03/05/2019   MG 2.3 01/28/2019   PHOS 3.1 01/27/2019   No results found for: CRP        Assessment & Plan:  The  patient is a 68 y/o obese WM with h/o AoVR in 2008 who was admitted to [facility] with fever, ARF, LT leg cellulitis and subsequently found to have Group G Streptococcal sepsis/PVIE.  1. Group G Strep sepsis/PVIE - Both of his initial blood cultures were positive for group G streptococcus with his most likely source being his left leg cellulitis.  While his transthoracic echocardiogram was mostly unrevealing, f/u transesophageal echocardiogram showed a preserved ejection fraction but mild aortic stenosis with a small mobile vegetation noted to his bioprosthetic aortic valve.  He had a low MIC to penicillin on his isolate sensitivities of less than 0.06, so I converted his ampicillin to aqueous penicillin.  Given concern for PVIE, I also added rifampin 300 mg p.o. TID as an adjunctive measure.  Given the high likelihood of failure of medical monotherapy with prosthetic valve endocarditis despite optimized antibiotics, I appreciate CT surgery evaluating this patient, intervention was deferred for now. He had a repeat TEE performed on 02/27/2019 that showed no remnant vegetation to his prosthetic aortic valve. He completes a 6-week course of IV PCN and PO rifampin combination therapy tomorrow on 03/07/2019 with his PM doses. Repeat blood cultures drawn while hospitalized were negative. He has an appointment with his cardiologist later this afternoon for re-assessment.  His PICC line was ordered to be removed on March 08, 2019 following his last doses of penicillin and rifampin.  Of note, the patient called our office this afternoon after his appointment requesting that his PICC line be removed today, we have asked that his PICC line still be removed on October 8 and that he receive the last dose of IV medications tomorrow.  2. LT leg cellulitis -this is the most likely source of the patient's group B strep bacteremia and now subsequently confirmed prosthetic valve endocarditis.  The patient reports no  pain with efforts to ambulate to his left knee at this time, although he is presenting today with a walker to assist with his ambulation.  He had hemorrhagic conversion of the bullae noted as a component of his cellulitis.  He will complete his IV PCN course tomorrow.  I have recommended that the patient look at his legs every evening for wounds and approached any proactively and treat topically so that hopefully he will not continue to have recurrent  cellulitis or bacteremias in the future.  His lower extremity swelling may in part be due to his low protein levels seen on his follow-up labs (yesterday's albumin was only 2.9).  He was told to increase his intake of high-protein foods to lower his risk for pedal edema and swelling.  3. ARF - Peak Cr was 1.96 while hospitalized. Cr has now returned to WNL at 1.01 from labs obtained on 03/05/2019.  Dosing of both antibiotics will/ be for creatinine clearance of greater than 60 at this time.

## 2019-03-06 NOTE — Patient Instructions (Signed)
HH to d/c your PICC line on October 8th after ABX are complete. If you develop fever > 100.5 in the week following completion of ABX, return to the ER for re-assessment and repeat blood cxs to be drawn.

## 2019-03-06 NOTE — Telephone Encounter (Signed)
Received call today from [place] with [facility] stating patient told her that MD is requesting his picc line removed on 10/7. Per office note today patient is supposed to be on antibiotics through 10/8 and can have picc pulled after last dose. This was confirmed with Dr. Prince Rome who would like to make sure patient does not mess any doses of antibiotics.  Coretta states that patient has refused most recent delivery for antibiotics, but will reach out to him again today to make sure he completes regimen as ordered. Coretta will have patient call office if she has any issues. [place]

## 2019-03-07 DIAGNOSIS — B955 Unspecified streptococcus as the cause of diseases classified elsewhere: Secondary | ICD-10-CM | POA: Diagnosis not present

## 2019-03-07 DIAGNOSIS — A419 Sepsis, unspecified organism: Secondary | ICD-10-CM | POA: Diagnosis not present

## 2019-03-07 DIAGNOSIS — L03116 Cellulitis of left lower limb: Secondary | ICD-10-CM | POA: Diagnosis not present

## 2019-03-07 DIAGNOSIS — Z96652 Presence of left artificial knee joint: Secondary | ICD-10-CM | POA: Diagnosis not present

## 2019-03-08 NOTE — Progress Notes (Signed)
CARDIOLOGY OFFICE NOTE  Date:  03/14/2019    [name] Date of Birth: 1951/05/28 Medical Record [id number]  PCP:  Mancel Bale, PA-C  Cardiologist:  Jennings Books   Chief Complaint  Patient presents with  . Follow-up    History of Present Illness: [name] [name] is a 68 y.o. male who presents today for a one week check.  Seen for Dr. Tamala Julian.   He has a history of a bicuspid aortic valve with aortic aneurysm status post Bentall procedure2008 with MAZE procedure for paroxysmal atrial fibrillation [name]), obesity, DM, obstructive sleep apnea, and hypertension.  Last seen here in March by Dr. Tamala Julian - felt to be doing well. Echo was to be arranged but then the pandemic struck.   He was admitted in August with cellulits of the lower left leg - ended up presenting with confusion/fever and weakness. Temp of 104. Cultures grew Group G strep - TEE with small mobile density on his bioprosthetic valve - treated with antibiotics - no surgical intervention needed. Some of his medicines were held. Was to have follow up TEE after 6 weeks of therapy for the aortic valve vegetation - this was done on 02/27/2019 - no vegetation noted.   I saw him last week - he had just a few more days of antibiotics and then his PICC was coming out. BP was up - concerned it was due to Rifampin - we elected to bring him back early for recheck once this was completed. He was given the ok to return to work. Clinically he was doing well.   The patient does not have symptoms concerning for COVID-19 infection (fever, chills, cough, or new shortness of breath).   Comes in today. Here alone. He has finished his antibiotics.  PICC is out. He is happy about this. Can shower better and move. His urine color has returned to normal color. Has stopped his Advil use - now using Tylenol. He is not having as much take out as before - therefore less salt. His leg is improving. He is still getting PT a few times a week. He  brought his wrist BP cuff - does not match up at all. No chest pain. Breathing is good.    Past Medical History:  Diagnosis Date  . Bicuspid aortic valve    Bentall procedure 2008  . Cellulitis 12/2018   LEFT LOWER EXTREMITY  . Heart attack ([place])   . Hypertension   . S/P aortic valve replacement with bioprosthetic valve 04/19/2007   25 mm Edwards Magna bovine pericardial tissue valve  . S/P ascending aortic aneurysm repair 04/19/2007   supracoronary straight graft  . S/P Maze operation for atrial fibrillation 04/19/2007    Past Surgical History:  Procedure Laterality Date  . CARDIAC CATHETERIZATION    . CARDIAC VALVE REPLACEMENT     2008  . CORONARY ARTERY BYPASS GRAFT  2007  . TEE WITHOUT CARDIOVERSION N/A 01/30/2019   Procedure: TRANSESOPHAGEAL ECHOCARDIOGRAM (TEE);  Surgeon: Skeet Latch, MD;  Location: [place];  Service: Cardiovascular;  Laterality: N/A;  . TEE WITHOUT CARDIOVERSION N/A 02/27/2019   Procedure: TRANSESOPHAGEAL ECHOCARDIOGRAM (TEE);  Surgeon: Skeet Latch, MD;  Location: [place];  Service: Cardiovascular;  Laterality: N/A;  . TONSILLECTOMY     age 38  . TOTAL KNEE ARTHROPLASTY Left 03/20/2018   Procedure: LEFT TOTAL KNEE ARTHROPLASTY;  Surgeon: Gaynelle Arabian, MD;  Location: [facility];  Service: Orthopedics;  Laterality: Left;  Marland Kitchen VASECTOMY  Medications: Current Meds  Medication Sig  . acetaminophen (TYLENOL) 325 MG tablet Take 650 mg by mouth every 6 (six) hours as needed.  . ALPRAZolam (XANAX XR) 0.5 MG 24 hr tablet Take 0.5 mg by mouth daily.  Marland Kitchen amLODipine (NORVASC) 10 MG tablet Take 1 tablet (10 mg total) by mouth daily.  Marland Kitchen atorvastatin (LIPITOR) 20 MG tablet Take 1 tablet (20 mg total) by mouth daily.  Marland Kitchen escitalopram (LEXAPRO) 20 MG tablet Take 1 tablet (20 mg total) by mouth daily.  . metoprolol succinate (TOPROL-XL) 25 MG 24 hr tablet Take 1 tablet (25 mg total) by mouth daily.  . mupirocin ointment (BACTROBAN) 2 % Apply 1  application topically daily as needed (wound care).   . sildenafil (VIAGRA) 100 MG tablet Take 0.5-1 tablets (50-100 mg total) by mouth daily as needed for erectile dysfunction.     Allergies: Allergies  Allergen Reactions  . Amiodarone Rash    Social History: The patient  reports that he quit smoking about 15 years ago. He has a 7.50 pack-year smoking history. He has never used smokeless tobacco. He reports previous alcohol use. He reports that he does not use drugs.   Family History: The patient's family history includes Atrial fibrillation in his brother and father; COPD in his mother; Cancer in his father, maternal grandfather, and maternal grandmother; Kidney disease in his maternal grandfather and maternal grandmother; Stroke in his paternal grandfather.   Review of Systems: Please see the history of present illness.   All other systems are reviewed and negative.   Physical Exam: VS:  BP (!) 148/80   Pulse 72   Ht 5\' 11"  (1.803 m)   Wt 249 lb 1.9 oz (113 kg)   SpO2 96%   BMI 34.75 kg/m  .  BMI Body mass index is 34.75 kg/m.  Wt Readings from Last 3 Encounters:  03/14/19 249 lb 1.9 oz (113 kg)  03/06/19 251 lb (113.9 kg)  02/27/19 250 lb (113.4 kg)   His BP was initially 148/80 = his cuff read 198/129.  Recheck by me is 130/80 in the right arm and 130/82 in the left.   General: Pleasant. Alert and in no acute distress.   HEENT: Normal.  Neck: Supple, no JVD, carotid bruits, or masses noted.  Cardiac: Regular rate and rhythm. No murmurs, rubs, or gallops. No edema.  Respiratory:  Lungs are clear to auscultation bilaterally with normal work of breathing.  GI: Soft and nontender.  MS: No deformity or atrophy. Gait and ROM intact. Using a walker for balance.  Skin: Warm and dry. Color is normal.  Neuro:  Strength and sensation are intact and no gross focal deficits noted.  Psych: Alert, appropriate and with normal affect.   LABORATORY DATA:  EKG:  EKG is not  ordered today.  Lab Results  Component Value Date   WBC 6.9 03/05/2019   HGB 11.5 (L) 03/05/2019   HCT 36.6 (L) 03/05/2019   PLT 247 03/05/2019   GLUCOSE 146 (H) 03/05/2019   CHOL 128 10/22/2017   TRIG 53 10/22/2017   HDL 50 10/22/2017   LDLCALC 67 10/22/2017   ALT 16 03/05/2019   AST 18 03/05/2019   NA 142 03/05/2019   K 4.0 03/05/2019   CL 109 03/05/2019   CREATININE 1.01 03/05/2019   BUN 17 03/05/2019   CO2 29 03/05/2019   TSH 0.960 10/22/2017   PSA 1.25 08/07/2014   INR 1.2 01/25/2019   HGBA1C 5.7 (H) 01/26/2019  BNP (last 3 results) No results for input(s): BNP in the last 8760 hours.  ProBNP (last 3 results) No results for input(s): PROBNP in the last 8760 hours.   Other Studies Reviewed Today:  TEE IMPRESSIONS 02/27/2019  1. Left ventricular ejection fraction, by visual estimation, is 60 to 65%. The left ventricle has normal function. Normal left ventricular size. There is no left ventricular hypertrophy. 2. Global right ventricle has normal systolic function.The right ventricular size is normal. No increase in right ventricular wall thickness. 3. Left atrial size was normal. 4. Left atrial appendage surgically absent. 5. Right atrial size was normal. 6. The mitral valve is normal in structure. No evidence of mitral valve regurgitation. No evidence of mitral stenosis. 7. The tricuspid valve is normal in structure. Tricuspid valve regurgitation was not visualized by color flow Doppler. 8. Aortic valve regurgitation is mild by color flow Doppler. Mild aortic valve stenosis. 9. No evidence of endocarditis. 10. The pulmonic valve was normal in structure. Pulmonic valve regurgitation is not visualized by color flow Doppler. 11. The inferior vena cava is normal in size with greater than 50% respiratory variability, suggesting right atrial pressure of 3 mmHg.    TEE IMPRESSIONS 01/30/2019  1. The left ventricle has normal systolic function, with  an ejection fraction of 60-65%. The cavity size was normal. 2. The right ventricle has normal systolc function. The cavity was normal. There is no increase in right ventricular wall thickness. 3. Left atrial appendage surgically absent. 4. No evidence of mitral valve stenosis. 5. Moderate thickening of the aortic valve Moderate calcification of the aortic valve. Aortic valve regurgitation is mild by color flow Doppler. Mild stenosis of the aortic valve. 6. Bioprosthetic aortic valve. The leaflet equivalent to the R coronary cusp is heavily calcified and restricted. The other leaflets are unrestricted. Mean gradient is mildly increased consistent with mild aortic stenosis. It appears that there is a  small (0.42 cm x 0.55 cm) mobile target on this leaflet. Finding concerning for endocarditis.   Assessment/Plan:  1. Recent bout of endocarditis - he is now finished with antibiotics. PICC is out. Most recent TEE was negative for vegetation. He has returned to work.   2. Prior AVR/Bentall/MAZE - in sinus by exam. Has restarted aspirin. He is reminded of SBE.   3. PAF - in sinus by exam today.   4. HTN - he is on less Metoprolol since prior to his admission. He was using excessive NSAID and too much salt - now off Rifampin. His cuff does not match up. BP with the large cuff is fine today. He will obtain a new cuff and continue to monitor. For now, no changes are made but we could increase his beta blocker back up if needed.   5. HLD - on statin  6. Obesity - down a few pounds.    7. COVID-19 Education: The signs and symptoms of COVID-19 were discussed with the patient and how to seek care for testing (follow up with PCP or arrange E-visit).  The importance of social distancing, staying at home, hand hygiene and wearing a mask when out in public were discussed today.  Current medicines are reviewed with the patient today.  The patient does not have concerns regarding medicines other than  what has been noted above.  The following changes have been made:  See above.  Labs/ tests ordered today include:   No orders of the defined types were placed in this encounter.    Disposition:  FU with Dr. Tamala Julian in about 2 months. I am happy to see back as needed.   Patient is agreeable to this plan and will call if any problems develop in the interim.   SignedTruitt Merle, NP  03/14/2019 2:31 PM  [place] [address] [place], [place]  [address] Phone: [phone] Fax: [phone]

## 2019-03-09 DIAGNOSIS — B955 Unspecified streptococcus as the cause of diseases classified elsewhere: Secondary | ICD-10-CM | POA: Diagnosis not present

## 2019-03-09 DIAGNOSIS — L03116 Cellulitis of left lower limb: Secondary | ICD-10-CM | POA: Diagnosis not present

## 2019-03-09 DIAGNOSIS — Z96652 Presence of left artificial knee joint: Secondary | ICD-10-CM | POA: Diagnosis not present

## 2019-03-09 DIAGNOSIS — A419 Sepsis, unspecified organism: Secondary | ICD-10-CM | POA: Diagnosis not present

## 2019-03-14 ENCOUNTER — Other Ambulatory Visit: Payer: Self-pay

## 2019-03-14 ENCOUNTER — Encounter: Payer: Self-pay | Admitting: Nurse Practitioner

## 2019-03-14 ENCOUNTER — Ambulatory Visit: Payer: BC Managed Care – PPO | Admitting: Nurse Practitioner

## 2019-03-14 VITALS — BP 148/80 | HR 72 | Ht 71.0 in | Wt 249.1 lb

## 2019-03-14 DIAGNOSIS — Z953 Presence of xenogenic heart valve: Secondary | ICD-10-CM

## 2019-03-14 DIAGNOSIS — Z9889 Other specified postprocedural states: Secondary | ICD-10-CM

## 2019-03-14 DIAGNOSIS — Z8679 Personal history of other diseases of the circulatory system: Secondary | ICD-10-CM

## 2019-03-14 DIAGNOSIS — I480 Paroxysmal atrial fibrillation: Secondary | ICD-10-CM

## 2019-03-14 DIAGNOSIS — I330 Acute and subacute infective endocarditis: Secondary | ICD-10-CM

## 2019-03-14 DIAGNOSIS — E782 Mixed hyperlipidemia: Secondary | ICD-10-CM

## 2019-03-14 DIAGNOSIS — I10 Essential (primary) hypertension: Secondary | ICD-10-CM

## 2019-03-14 NOTE — Patient Instructions (Addendum)
After Visit Summary:  We will be checking the following labs today - NONE  Medication Instructions:    Continue with your current medicines.    If you need a refill on your cardiac medications before your next appointment, please call your pharmacy.     Testing/Procedures To Be Arranged:  N/A  Follow-Up:   See Dr. Tamala Julian in about 2 months    At [facility], you and your health needs are our priority.  As part of our continuing mission to provide you with exceptional heart care, we have created designated Provider Care Teams.  These Care Teams include your primary Cardiologist (physician) and Advanced Practice Providers (APPs -  Physician Assistants and Nurse Practitioners) who all work together to provide you with the care you need, when you need it.  Special Instructions:  . Stay safe, stay home, wash your hands for at least 20 seconds and wear a mask when out in public.  . It was good to talk with you today.  . Get a large BP cuff - that goes around the upper arm - Omron is ok.  Marland Kitchen Keep cutting back on the take out food and restricting the use of the Advil/Aleve.    Call the [place] office at [phone] if you have any questions, problems or concerns.

## 2019-03-15 DIAGNOSIS — I10 Essential (primary) hypertension: Secondary | ICD-10-CM | POA: Diagnosis not present

## 2019-03-15 DIAGNOSIS — F419 Anxiety disorder, unspecified: Secondary | ICD-10-CM | POA: Diagnosis not present

## 2019-03-15 DIAGNOSIS — F439 Reaction to severe stress, unspecified: Secondary | ICD-10-CM | POA: Diagnosis not present

## 2019-04-05 DIAGNOSIS — F419 Anxiety disorder, unspecified: Secondary | ICD-10-CM | POA: Diagnosis not present

## 2019-04-06 DIAGNOSIS — G4733 Obstructive sleep apnea (adult) (pediatric): Secondary | ICD-10-CM | POA: Diagnosis not present

## 2019-04-13 DIAGNOSIS — F430 Acute stress reaction: Secondary | ICD-10-CM | POA: Diagnosis not present

## 2019-04-13 DIAGNOSIS — Z8679 Personal history of other diseases of the circulatory system: Secondary | ICD-10-CM | POA: Diagnosis not present

## 2019-04-13 DIAGNOSIS — F419 Anxiety disorder, unspecified: Secondary | ICD-10-CM | POA: Diagnosis not present

## 2019-04-13 DIAGNOSIS — L03116 Cellulitis of left lower limb: Secondary | ICD-10-CM | POA: Diagnosis not present

## 2019-04-27 DIAGNOSIS — F419 Anxiety disorder, unspecified: Secondary | ICD-10-CM | POA: Diagnosis not present

## 2019-04-27 DIAGNOSIS — R3912 Poor urinary stream: Secondary | ICD-10-CM | POA: Diagnosis not present

## 2019-04-27 DIAGNOSIS — N401 Enlarged prostate with lower urinary tract symptoms: Secondary | ICD-10-CM | POA: Diagnosis not present

## 2019-04-27 DIAGNOSIS — F4323 Adjustment disorder with mixed anxiety and depressed mood: Secondary | ICD-10-CM | POA: Diagnosis not present

## 2019-04-27 DIAGNOSIS — M79605 Pain in left leg: Secondary | ICD-10-CM | POA: Diagnosis not present

## 2019-04-30 ENCOUNTER — Other Ambulatory Visit: Payer: Self-pay

## 2019-04-30 MED ORDER — ATORVASTATIN CALCIUM 20 MG PO TABS: 20.0000 mg | ORAL_TABLET | Freq: Every day | ORAL | 2 refills | Status: AC

## 2019-05-01 DIAGNOSIS — G8929 Other chronic pain: Secondary | ICD-10-CM | POA: Diagnosis not present

## 2019-05-01 DIAGNOSIS — M25562 Pain in left knee: Secondary | ICD-10-CM | POA: Diagnosis not present

## 2019-05-03 DIAGNOSIS — M25562 Pain in left knee: Secondary | ICD-10-CM | POA: Diagnosis not present

## 2019-05-03 DIAGNOSIS — G8929 Other chronic pain: Secondary | ICD-10-CM | POA: Diagnosis not present

## 2019-05-09 DIAGNOSIS — I38 Endocarditis, valve unspecified: Secondary | ICD-10-CM | POA: Diagnosis not present

## 2019-05-09 DIAGNOSIS — G8929 Other chronic pain: Secondary | ICD-10-CM | POA: Diagnosis not present

## 2019-05-09 DIAGNOSIS — L02416 Cutaneous abscess of left lower limb: Secondary | ICD-10-CM | POA: Diagnosis not present

## 2019-05-09 DIAGNOSIS — M25562 Pain in left knee: Secondary | ICD-10-CM | POA: Diagnosis not present

## 2019-05-09 DIAGNOSIS — L03116 Cellulitis of left lower limb: Secondary | ICD-10-CM | POA: Diagnosis not present

## 2019-05-09 DIAGNOSIS — T826XXD Infection and inflammatory reaction due to cardiac valve prosthesis, subsequent encounter: Secondary | ICD-10-CM | POA: Diagnosis not present

## 2019-05-11 DIAGNOSIS — G8929 Other chronic pain: Secondary | ICD-10-CM | POA: Diagnosis not present

## 2019-05-11 DIAGNOSIS — M25562 Pain in left knee: Secondary | ICD-10-CM | POA: Diagnosis not present

## 2019-05-14 DIAGNOSIS — R3912 Poor urinary stream: Secondary | ICD-10-CM | POA: Diagnosis not present

## 2019-05-14 DIAGNOSIS — Z96652 Presence of left artificial knee joint: Secondary | ICD-10-CM | POA: Diagnosis not present

## 2019-05-14 DIAGNOSIS — M6281 Muscle weakness (generalized): Secondary | ICD-10-CM | POA: Diagnosis not present

## 2019-05-14 DIAGNOSIS — F419 Anxiety disorder, unspecified: Secondary | ICD-10-CM | POA: Diagnosis not present

## 2019-05-14 DIAGNOSIS — N401 Enlarged prostate with lower urinary tract symptoms: Secondary | ICD-10-CM | POA: Diagnosis not present

## 2019-05-21 DIAGNOSIS — M25562 Pain in left knee: Secondary | ICD-10-CM | POA: Diagnosis not present

## 2019-05-21 DIAGNOSIS — G8929 Other chronic pain: Secondary | ICD-10-CM | POA: Diagnosis not present

## 2019-05-23 ENCOUNTER — Ambulatory Visit: Payer: BC Managed Care – PPO | Admitting: Interventional Cardiology

## 2019-05-23 DIAGNOSIS — M25562 Pain in left knee: Secondary | ICD-10-CM | POA: Diagnosis not present

## 2019-05-23 DIAGNOSIS — G8929 Other chronic pain: Secondary | ICD-10-CM | POA: Diagnosis not present

## 2019-05-30 ENCOUNTER — Other Ambulatory Visit (HOSPITAL_COMMUNITY): Payer: BLUE CROSS/BLUE SHIELD

## 2019-06-27 ENCOUNTER — Ambulatory Visit: Payer: BC Managed Care – PPO

## 2019-07-06 ENCOUNTER — Ambulatory Visit: Payer: BC Managed Care – PPO

## 2019-07-18 ENCOUNTER — Ambulatory Visit: Payer: BC Managed Care – PPO

## 2019-08-13 ENCOUNTER — Ambulatory Visit: Payer: BC Managed Care – PPO | Admitting: Interventional Cardiology

## 2020-05-06 ENCOUNTER — Other Ambulatory Visit: Payer: Self-pay | Admitting: *Deleted

## 2020-05-06 MED ORDER — METOPROLOL SUCCINATE ER 25 MG PO TB24: 25.0000 mg | ORAL_TABLET | Freq: Every day | ORAL | 0 refills | Status: DC

## 2020-06-26 ENCOUNTER — Other Ambulatory Visit: Payer: Self-pay | Admitting: Cardiovascular Disease

## 2021-06-14 IMAGING — CT CT HEAD WITHOUT CONTRAST
4 series · 16 of 47 positions shown, 18 images · non-contrast
Comparison: None.

CLINICAL DATA: Altered level of consciousness

EXAM:
CT HEAD WITHOUT CONTRAST
TECHNIQUE: Contiguous axial images were obtained from the base of the skull
through the vertex without intravenous contrast.

[Series 3: head without · axial · non-contrast · 0.50mm/px · z∈[-79,+56]mm · 7 of 37 slices shown, 9 images]
[im 5/37  brain]
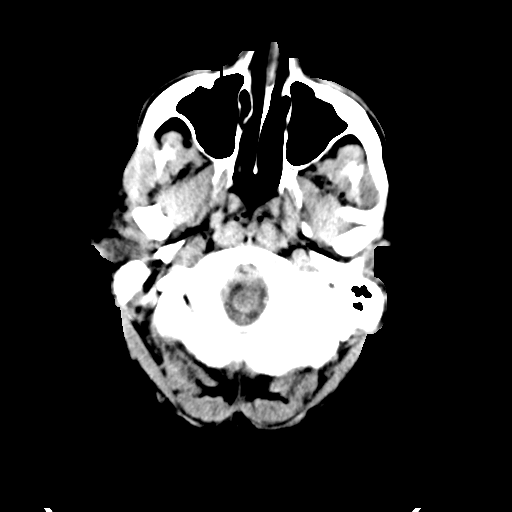
[im 5/37  bone]
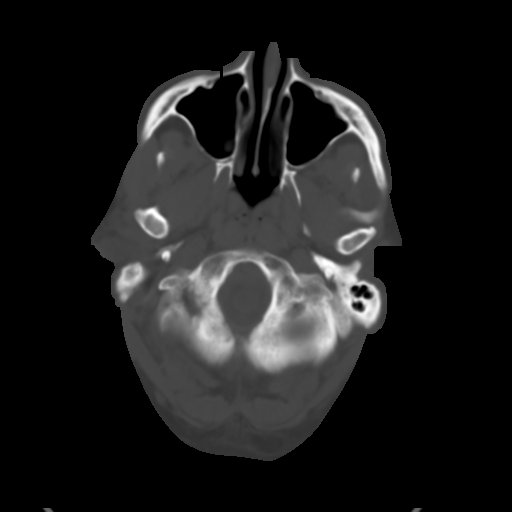
[im 10/37  brain]
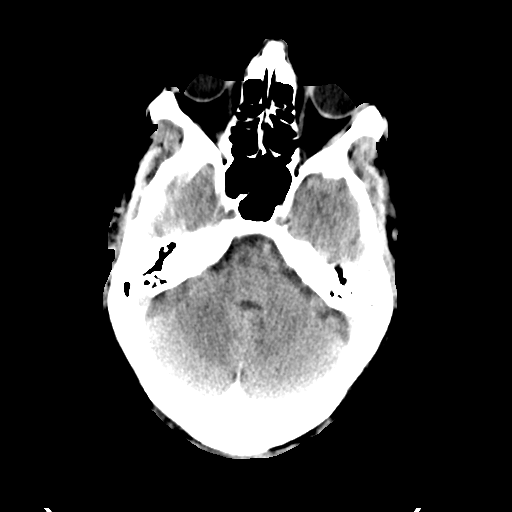
[im 14/37  brain]
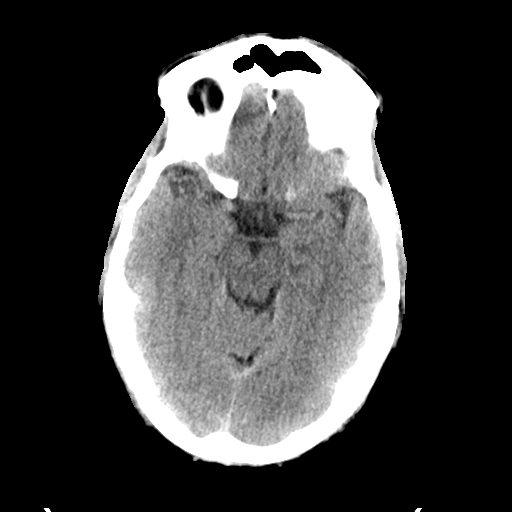
[im 19/37  brain]
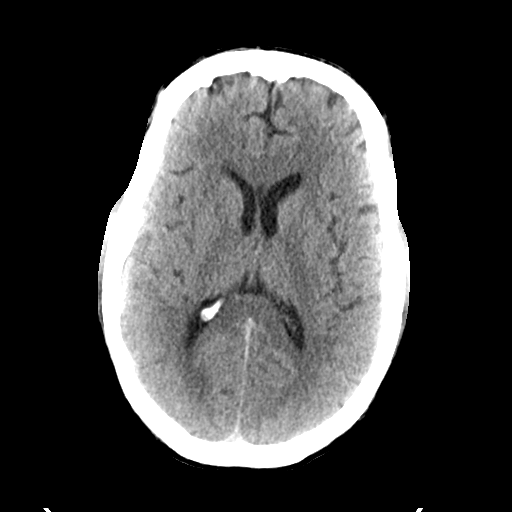
[im 23/37  brain]
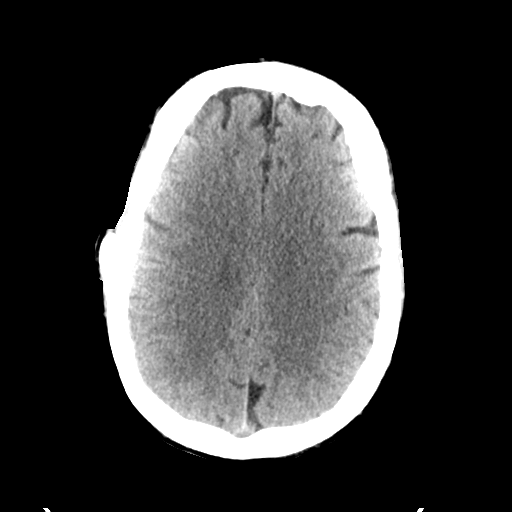
[im 23/37  bone]
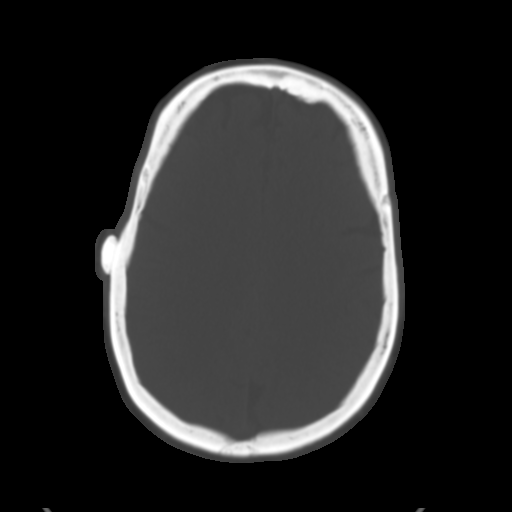
[im 28/37  brain]
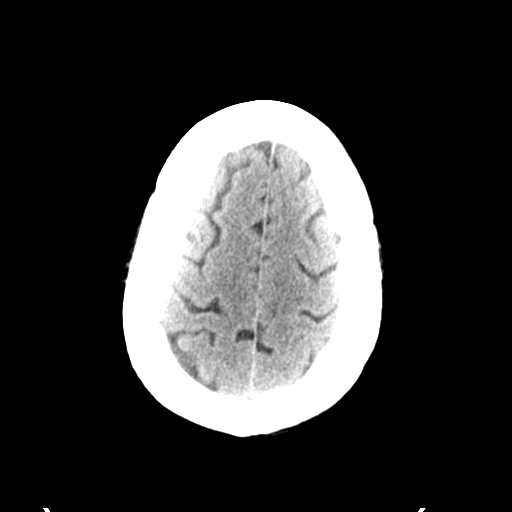
[im 32/37  brain]
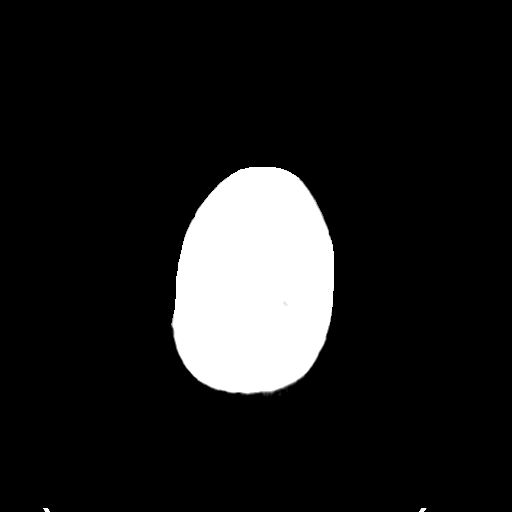

[Series 4: head bone · axial · 0.50mm/px · z∈[-81,-45]mm · 3 of 92 slices shown]
[im 10/92  bone]
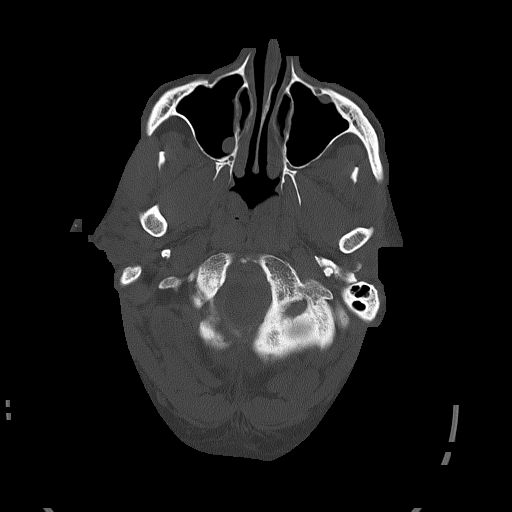
[im 19/92  bone]
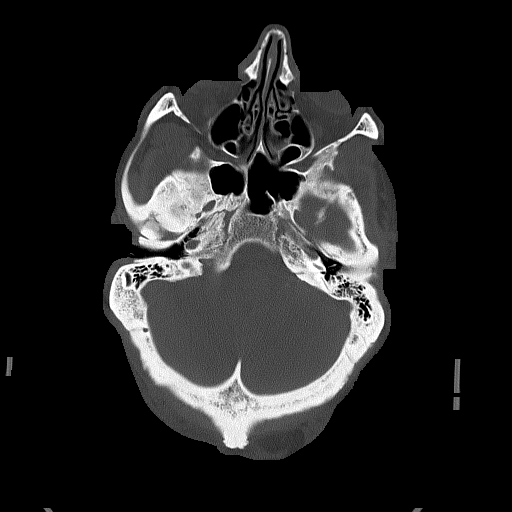
[im 28/92  bone]
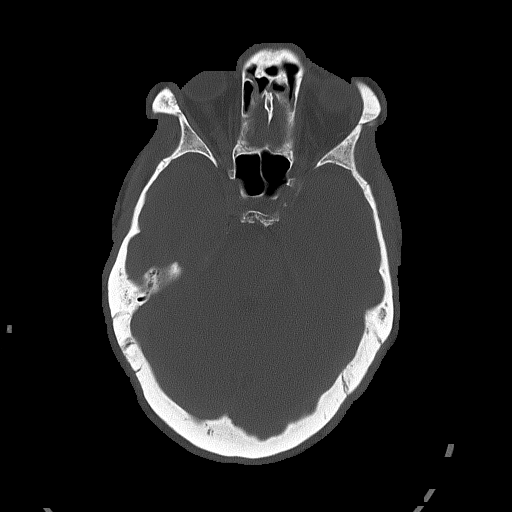

[Series 5: head without cor · coronal · non-contrast · 0.36mm/px · 3 of 83 slices shown]
[im 28/83  brain]
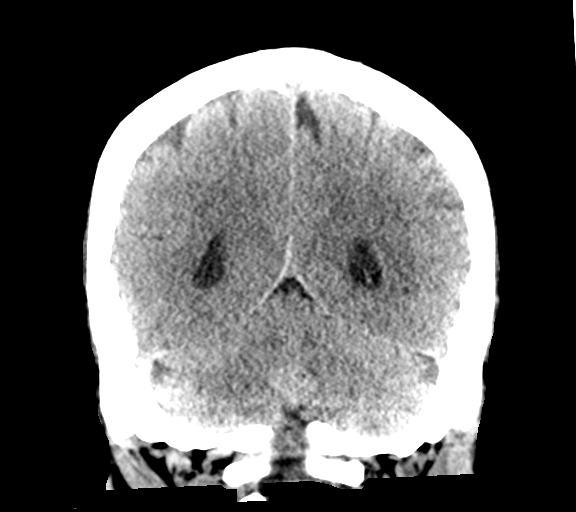
[im 37/83  brain]
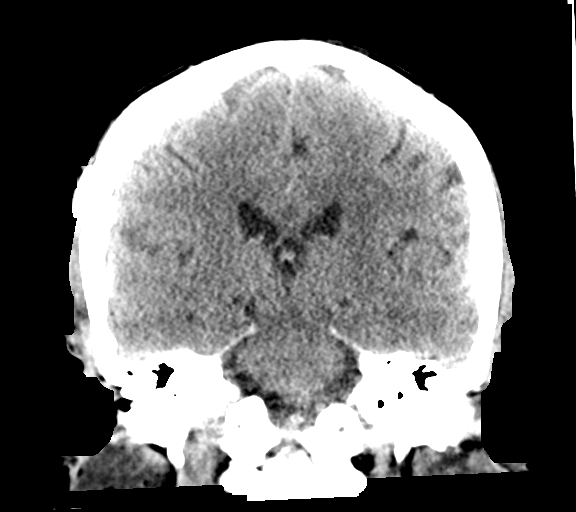
[im 46/83  brain]
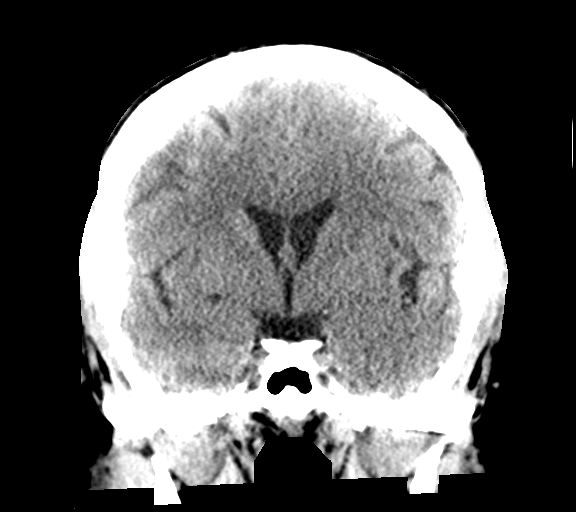

[Series 6: head without sag · sagittal · non-contrast · 0.36mm/px · 3 of 65 slices shown]
[im 22/65  brain]
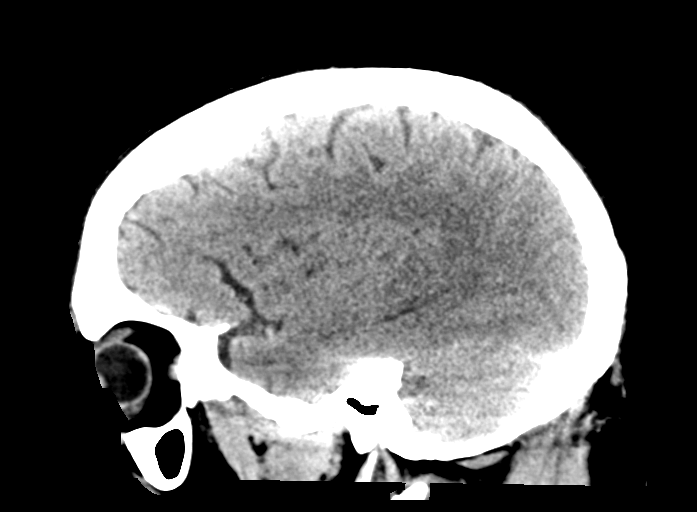
[im 33/65  brain]
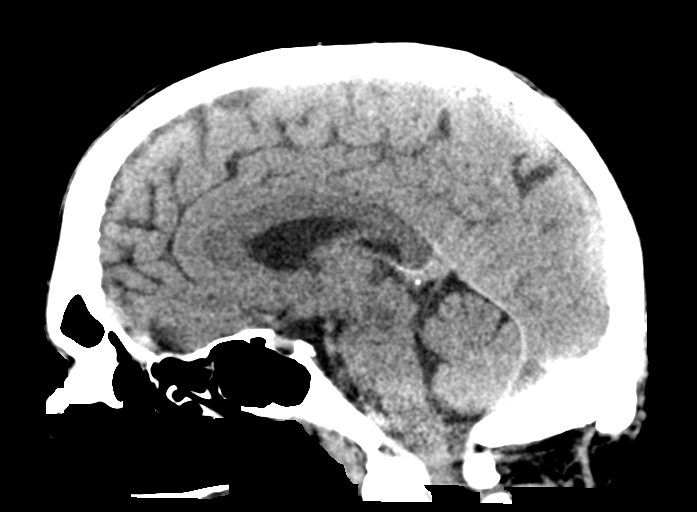
[im 43/65  brain]
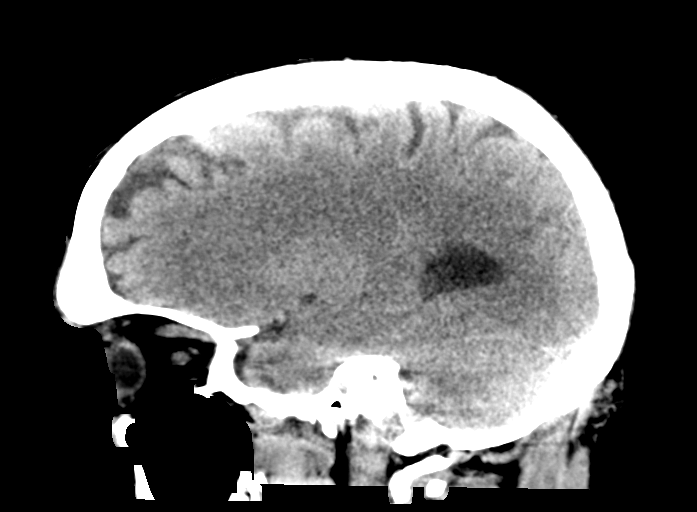

[16 of 47 positions shown; findings below may reference images not displayed]

FINDINGS: Brain: No acute intracranial abnormality. Specifically, no
hemorrhage, hydrocephalus, mass lesion, acute infarction, or
significant intracranial injury.

Vascular: No hyperdense vessel or unexpected calcification.

Skull: No acute calvarial abnormality.

Sinuses/Orbits: No acute finding

Other: None
IMPRESSION: No acute intracranial abnormality.
